# Patient Record
Sex: Male | Born: 1965
Health system: Southern US, Community
[De-identification: ages and names within clinical notes are randomized; demographics above are authoritative.]

## PROBLEM LIST (undated history)

## (undated) DIAGNOSIS — G4730 Sleep apnea, unspecified: Secondary | ICD-10-CM

## (undated) DIAGNOSIS — R0602 Shortness of breath: Secondary | ICD-10-CM

## (undated) DIAGNOSIS — E785 Hyperlipidemia, unspecified: Secondary | ICD-10-CM

## (undated) DIAGNOSIS — K429 Umbilical hernia without obstruction or gangrene: Secondary | ICD-10-CM

## (undated) DIAGNOSIS — M2020 Hallux rigidus, unspecified foot: Secondary | ICD-10-CM

## (undated) DIAGNOSIS — I10 Essential (primary) hypertension: Secondary | ICD-10-CM

## (undated) HISTORY — DX: Umbilical hernia without obstruction or gangrene: K42.9

## (undated) HISTORY — DX: Essential (primary) hypertension: I10

## (undated) HISTORY — DX: Sleep apnea, unspecified: G47.30

## (undated) HISTORY — DX: Shortness of breath: R06.02

## (undated) HISTORY — PX: ROTATOR CUFF REPAIR: SHX139

## (undated) HISTORY — DX: Hyperlipidemia, unspecified: E78.5

---
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
## 2004-06-14 ENCOUNTER — Ambulatory Visit: Payer: Self-pay | Admitting: Family Medicine

## 2004-12-03 ENCOUNTER — Ambulatory Visit: Payer: Self-pay | Admitting: Family Medicine

## 2005-08-26 ENCOUNTER — Ambulatory Visit: Payer: Self-pay | Admitting: Family Medicine

## 2005-09-22 ENCOUNTER — Ambulatory Visit: Payer: Self-pay | Admitting: Family Medicine

## 2005-10-14 ENCOUNTER — Ambulatory Visit: Payer: Self-pay | Admitting: Family Medicine

## 2005-11-24 ENCOUNTER — Ambulatory Visit: Payer: Self-pay | Admitting: Family Medicine

## 2006-01-27 ENCOUNTER — Ambulatory Visit: Payer: Self-pay | Admitting: Family Medicine

## 2006-04-21 HISTORY — PX: TONSILLECTOMY AND ADENOIDECTOMY: SUR1326

## 2006-04-21 HISTORY — PX: UVULOPALATOPHARYNGOPLASTY: SHX827

## 2006-08-14 ENCOUNTER — Ambulatory Visit: Payer: Self-pay | Admitting: Physician Assistant

## 2010-11-08 ENCOUNTER — Ambulatory Visit (INDEPENDENT_AMBULATORY_CARE_PROVIDER_SITE_OTHER): Payer: Self-pay | Admitting: Surgery

## 2010-11-28 ENCOUNTER — Encounter (INDEPENDENT_AMBULATORY_CARE_PROVIDER_SITE_OTHER): Payer: Self-pay | Admitting: Surgery

## 2010-11-28 ENCOUNTER — Ambulatory Visit (INDEPENDENT_AMBULATORY_CARE_PROVIDER_SITE_OTHER): Payer: BC Managed Care – PPO | Admitting: Surgery

## 2010-11-28 VITALS — BP 132/96 | HR 72 | Temp 97.4°F | Ht 72.0 in | Wt 229.0 lb

## 2010-11-28 DIAGNOSIS — K429 Umbilical hernia without obstruction or gangrene: Secondary | ICD-10-CM

## 2010-11-28 NOTE — Progress Notes (Signed)
Scott Rasmussen is a 45 y.o. male.    Chief Complaint  Patient presents with  . Other    new pt- eval umb hernia    HPI HPIThe patient presents today at the request of Dr. weeks for a bulge at his bellybutton. It has been present for 2 months. It is not getting larger. It does not cause him any pain. He denies any nausea, vomiting, or change in bowel or bladder function. There is no redness or drainage from his umbilicus.   Past Medical History  Diagnosis Date  . Shortness of breath   . Gout   . Umbilical hernia   . Sleep apnea     Past Surgical History  Procedure Date  . Tonsillectomy and adenoidectomy   . Uvulopalatopharyngoplasty     Family History  Problem Relation Age of Onset  . Heart disease Father     heart attack    Social History History  Substance Use Topics  . Smoking status: Former Games developer  . Smokeless tobacco: Not on file  . Alcohol Use: No    No Known Allergies  Current Outpatient Prescriptions  Medication Sig Dispense Refill  . allopurinol (ZYLOPRIM) 300 MG tablet Daily.      Marland Kitchen doxycycline (VIBRAMYCIN) 100 MG capsule Ad lib.      Marland Kitchen Fenofibrate (TRICOR PO) Take by mouth daily.        Marland Kitchen HYDROcodone-acetaminophen (LORTAB) 7.5-500 MG per tablet Ad lib.      Marland Kitchen omega-3 acid ethyl esters (LOVAZA) 1 G capsule Take 2 g by mouth 2 (two) times daily.        Marland Kitchen omeprazole (PRILOSEC) 40 MG capsule Daily.      Marland Kitchen PROAIR HFA 108 (90 BASE) MCG/ACT inhaler Ad lib.        Review of Systems Review of Systems  Constitutional: Negative.   HENT: Negative.   Eyes: Negative.   Respiratory: Negative.   Cardiovascular: Negative.   Gastrointestinal: Negative.   Genitourinary: Negative.   Musculoskeletal: Negative.   Skin: Negative.   Neurological: Negative.   Endo/Heme/Allergies: Negative.   Psychiatric/Behavioral: Negative.     Physical Exam Physical Exam  Constitutional: He is oriented to person, place, and time. He appears well-developed and well-nourished.   HENT:  Head: Normocephalic and atraumatic.  Nose: Nose normal.  Eyes: Conjunctivae and EOM are normal. Pupils are equal, round, and reactive to light.  Neck: Normal range of motion. Neck supple.  Cardiovascular: Normal rate and regular rhythm.   Respiratory: Effort normal and breath sounds normal.  GI: Soft. Bowel sounds are normal.       Small umbilical hernia noted over superior aspect of umbilicus nontender. No drainage or redness noted. Reducible.  Musculoskeletal: Normal range of motion.  Neurological: He is alert and oriented to person, place, and time.  Skin: Skin is warm and dry.     Blood pressure 132/96, pulse 72, temperature 97.4 F (36.3 C), height 6' (1.829 m), weight 229 lb (103.874 kg).  Assessment/Plan Umbilical hernia asymptomatic  I discussed options of surgical repair versus observation with the patient.  It Is not causing him any problem and he wishes to observe this. Surgical risks were discussed as well as risks of observational therapy.  He will call if he develops any pain or increased swelling.  Laria Grimmett A. 11/28/2010, 12:08 PM

## 2010-11-28 NOTE — Patient Instructions (Signed)
Follow up if you develop pain or increased swelling.

## 2011-02-04 ENCOUNTER — Ambulatory Visit: Payer: Self-pay | Admitting: Physical Therapy

## 2012-07-14 ENCOUNTER — Telehealth: Payer: Self-pay | Admitting: Physician Assistant

## 2012-07-14 ENCOUNTER — Other Ambulatory Visit: Payer: Self-pay | Admitting: Physician Assistant

## 2012-07-14 DIAGNOSIS — E785 Hyperlipidemia, unspecified: Secondary | ICD-10-CM

## 2012-07-14 MED ORDER — FENOFIBRATE 145 MG PO TABS: 145.0000 mg | ORAL_TABLET | Freq: Every day | ORAL | Status: DC

## 2012-07-14 NOTE — Telephone Encounter (Signed)
PT TOLD THAT HE COULD POSSIBLY HAVE A DVT- AND WE HIGHLY RECCOMMENDED HIM TO GO BY URGENT CARE- WHILE IN [place] TODAY, DUE TO NO AVAIL. APPTS HERE TODAY.   PT ALSO NEEDS A WRITEN OUT MAIL ORDER RX FOR TRICOR- CALL PT WILL PICK UP.

## 2012-07-14 NOTE — Telephone Encounter (Signed)
Not sure if first message sent- he needs a mail order script for tricor- call when ready

## 2012-07-14 NOTE — Telephone Encounter (Signed)
tricor script approved

## 2012-07-14 NOTE — Telephone Encounter (Signed)
APPT MADE

## 2012-07-14 NOTE — Telephone Encounter (Signed)
ntbs for tingling in bottom of left foot and going up his calf. wtbs tomorrow of Friday

## 2012-08-18 ENCOUNTER — Telehealth: Payer: Self-pay | Admitting: Family Medicine

## 2012-08-19 NOTE — Telephone Encounter (Signed)
SPOKE WITH PATIENT AND WILL HAVE DR Christell Constant REVIEW RX

## 2012-11-15 ENCOUNTER — Encounter: Payer: Self-pay | Admitting: Family Medicine

## 2012-11-15 ENCOUNTER — Ambulatory Visit (INDEPENDENT_AMBULATORY_CARE_PROVIDER_SITE_OTHER): Payer: BC Managed Care – PPO | Admitting: Family Medicine

## 2012-11-15 VITALS — BP 127/88 | HR 101 | Temp 97.9°F | Ht 70.5 in | Wt 226.0 lb

## 2012-11-15 DIAGNOSIS — R5381 Other malaise: Secondary | ICD-10-CM

## 2012-11-15 DIAGNOSIS — Z0000 Encounter for general adult medical examination without abnormal findings: Secondary | ICD-10-CM

## 2012-11-15 DIAGNOSIS — K219 Gastro-esophageal reflux disease without esophagitis: Secondary | ICD-10-CM

## 2012-11-15 DIAGNOSIS — E785 Hyperlipidemia, unspecified: Secondary | ICD-10-CM

## 2012-11-15 DIAGNOSIS — M109 Gout, unspecified: Secondary | ICD-10-CM

## 2012-11-15 DIAGNOSIS — Z23 Encounter for immunization: Secondary | ICD-10-CM

## 2012-11-15 DIAGNOSIS — R5383 Other fatigue: Secondary | ICD-10-CM

## 2012-11-15 DIAGNOSIS — G4700 Insomnia, unspecified: Secondary | ICD-10-CM

## 2012-11-15 MED ORDER — FENOFIBRATE 145 MG PO TABS: 145.0000 mg | ORAL_TABLET | Freq: Every day | ORAL | Status: DC

## 2012-11-15 MED ORDER — OMEGA-3-ACID ETHYL ESTERS 1 G PO CAPS: 2.0000 g | ORAL_CAPSULE | Freq: Two times a day (BID) | ORAL | Status: DC

## 2012-11-15 MED ORDER — OMEPRAZOLE 40 MG PO CPDR: 40.0000 mg | DELAYED_RELEASE_CAPSULE | Freq: Every day | ORAL | Status: DC

## 2012-11-15 MED ORDER — INDOMETHACIN ER 75 MG PO CPCR: ORAL_CAPSULE | ORAL | Status: DC

## 2012-11-15 MED ORDER — ALLOPURINOL 300 MG PO TABS: 300.0000 mg | ORAL_TABLET | Freq: Every day | ORAL | Status: DC

## 2012-11-15 MED ORDER — ESZOPICLONE 2 MG PO TABS: ORAL_TABLET | ORAL | Status: DC

## 2012-11-15 NOTE — Progress Notes (Signed)
  Subjective:    Patient ID: Scott Rasmussen, male    DOB: January 16, 1966, 47 y.o.   MRN: [id number]  HPI This 47 y.o. male presents for evaluation of follow up on his gout.  He is doing fine And denies any gout problems.  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  He has hx of hyperlipidemia, GERD, and he has been having Fatigue lately.  He has hx of OSAS and has had uvuloplasty which has resolved this.   Review of Systems No chest pain, SOB, HA, dizziness, vision change, N/V, diarrhea, constipation, dysuria, urinary urgency or frequency, myalgias, arthralgias or rash.     Objective:   Physical Exam Vital signs noted  Well developed well nourished male.  HEENT - Head atraumatic Normocephalic                Eyes - PERRLA, Conjuctiva - clear Sclera- Clear EOMI                Ears - EAC's Wnl TM's Wnl Gross Hearing WNL                Nose - Nares patent                 Throat - oropharanx wnl Respiratory - Lungs CTA bilateral Cardiac - RRR S1 and S2 without murmur GI - Abdomen soft Nontender and bowel sounds active x 4 Extremities - No edema. Neuro - Grossly intact.       Assessment & Plan:  Gout - Plan: Uric acid, allopurinol (ZYLOPRIM) 300 MG tablet  Fatigue - Plan: Testosterone,Free and Total, POCT CBC  Need for Tdap vaccination - Plan: Tdap vaccine greater than or equal to 7yo IM  Healthcare maintenance - Plan: BMP8+EGFR, Lipid panel, Hepatic function panel  Other and unspecified hyperlipidemia - Plan: fenofibrate (TRICOR) 145 MG tablet, omega-3 acid ethyl esters (LOVAZA) 1 G capsule  GERD (gastroesophageal reflux disease) - Plan: omeprazole (PRILOSEC) 40 MG capsule  Insomnia - Plan: eszopiclone (LUNESTA) 2 MG TABS

## 2012-11-15 NOTE — Patient Instructions (Signed)

## 2012-11-16 ENCOUNTER — Other Ambulatory Visit (INDEPENDENT_AMBULATORY_CARE_PROVIDER_SITE_OTHER): Payer: BC Managed Care – PPO | Admitting: Family Medicine

## 2012-11-16 ENCOUNTER — Other Ambulatory Visit: Payer: BC Managed Care – PPO

## 2012-11-16 DIAGNOSIS — R5381 Other malaise: Secondary | ICD-10-CM

## 2012-11-16 DIAGNOSIS — M109 Gout, unspecified: Secondary | ICD-10-CM

## 2012-11-16 DIAGNOSIS — R5383 Other fatigue: Secondary | ICD-10-CM

## 2012-11-16 DIAGNOSIS — Z0000 Encounter for general adult medical examination without abnormal findings: Secondary | ICD-10-CM

## 2012-11-16 DIAGNOSIS — Z23 Encounter for immunization: Secondary | ICD-10-CM

## 2012-11-16 LAB — POCT CBC
Granulocyte percent: 64.5 %G (ref 37–80)
HCT, POC: 49.8 % (ref 43.5–53.7)
Hemoglobin: 16.7 g/dL (ref 14.1–18.1)
Lymph, poc: 1.6 (ref 0.6–3.4)
MCH, POC: 29.2 pg (ref 27–31.2)
MCHC: 33.6 g/dL (ref 31.8–35.4)
MCV: 86.9 fL (ref 80–97)
MPV: 8.2 fL (ref 0–99.8)
POC Granulocyte: 3.5 (ref 2–6.9)
POC LYMPH PERCENT: 28.2 %L (ref 10–50)
Platelet Count, POC: 285 10*3/uL (ref 142–424)
RBC: 5.7 M/uL (ref 4.69–6.13)
RDW, POC: 12.7 %
WBC: 5.5 10*3/uL (ref 4.6–10.2)

## 2012-11-16 NOTE — Progress Notes (Signed)
Patient here today for labs ordered by [facility].Scott Rasmussen

## 2012-11-18 ENCOUNTER — Ambulatory Visit (INDEPENDENT_AMBULATORY_CARE_PROVIDER_SITE_OTHER): Payer: BC Managed Care – PPO | Admitting: Family Medicine

## 2012-11-18 ENCOUNTER — Encounter: Payer: Self-pay | Admitting: Family Medicine

## 2012-11-18 VITALS — BP 130/88 | HR 79 | Temp 96.7°F | Wt 228.4 lb

## 2012-11-18 DIAGNOSIS — Q828 Other specified congenital malformations of skin: Secondary | ICD-10-CM

## 2012-11-18 DIAGNOSIS — R197 Diarrhea, unspecified: Secondary | ICD-10-CM

## 2012-11-18 LAB — BMP8+EGFR
BUN/Creatinine Ratio: 12 (ref 9–20)
BUN: 13 mg/dL (ref 6–24)
CO2: 23 mmol/L (ref 18–29)
Calcium: 9.7 mg/dL (ref 8.7–10.2)
Chloride: 104 mmol/L (ref 97–108)
Creatinine, Ser: 1.05 mg/dL (ref 0.76–1.27)
GFR calc Af Amer: 99 mL/min/{1.73_m2} (ref >59)
GFR calc non Af Amer: 85 mL/min/{1.73_m2} (ref >59)
Glucose: 88 mg/dL (ref 65–99)
Potassium: 4.6 mmol/L (ref 3.5–5.2)
Sodium: 142 mmol/L (ref 134–144)

## 2012-11-18 LAB — HEPATIC FUNCTION PANEL
ALT: 41 IU/L (ref 0–44)
AST: 27 IU/L (ref 0–40)
Albumin: 4.5 g/dL (ref 3.5–5.5)
Alkaline Phosphatase: 58 IU/L (ref 39–117)
Bilirubin, Direct: 0.1 mg/dL (ref 0.00–0.40)
Total Bilirubin: 0.4 mg/dL (ref 0.0–1.2)
Total Protein: 6.8 g/dL (ref 6.0–8.5)

## 2012-11-18 LAB — LIPID PANEL
Chol/HDL Ratio: 4.3 ratio units (ref 0.0–5.0)
Cholesterol, Total: 142 mg/dL (ref 100–199)
HDL: 33 mg/dL — ABNORMAL LOW (ref >39)
LDL Calculated: 83 mg/dL (ref 0–99)
Triglycerides: 130 mg/dL (ref 0–149)
VLDL Cholesterol Cal: 26 mg/dL (ref 5–40)

## 2012-11-18 LAB — URIC ACID: Uric Acid: 7.5 mg/dL (ref 3.7–8.6)

## 2012-11-18 LAB — TESTOSTERONE,FREE AND TOTAL
Testosterone, Free: 6.8 pg/mL (ref 6.8–21.5)
Testosterone: 293 ng/dL — ABNORMAL LOW (ref 348–1197)

## 2012-11-18 NOTE — Patient Instructions (Signed)
Diarrhea Diarrhea is frequent loose and watery bowel movements. It can cause you to feel weak and dehydrated. Dehydration can cause you to become tired and thirsty, have a dry mouth, and have decreased urination that often is dark yellow. Diarrhea is a sign of another problem, most often an infection that will not last long. In most cases, diarrhea typically lasts 2 3 days. However, it can last longer if it is a sign of something more serious. It is important to treat your diarrhea as directed by your caregive to lessen or prevent future episodes of diarrhea. CAUSES  Some common causes include:  Gastrointestinal infections caused by viruses, bacteria, or parasites.  Food poisoning or food allergies.  Certain medicines, such as antibiotics, chemotherapy, and laxatives.  Artificial sweeteners and fructose.  Digestive disorders. HOME CARE INSTRUCTIONS  Ensure adequate fluid intake (hydration): have 1 cup (8 oz) of fluid for each diarrhea episode. Avoid fluids that contain simple sugars or sports drinks, fruit juices, whole milk products, and sodas. Your urine should be clear or pale yellow if you are drinking enough fluids. Hydrate with an oral rehydration solution that you can purchase at pharmacies, retail stores, and online. You can prepare an oral rehydration solution at home by mixing the following ingredients together:    tsp table salt.   tsp baking soda.   tsp salt substitute containing potassium chloride.  1  tablespoons sugar.  1 L (34 oz) of water.  Certain foods and beverages may increase the speed at which food moves through the gastrointestinal (GI) tract. These foods and beverages should be avoided and include:  Caffeinated and alcoholic beverages.  High-fiber foods, such as raw fruits and vegetables, nuts, seeds, and whole grain breads and cereals.  Foods and beverages sweetened with sugar alcohols, such as xylitol, sorbitol, and mannitol.  Some foods may be well  tolerated and may help thicken stool including:  Starchy foods, such as rice, toast, pasta, low-sugar cereal, oatmeal, grits, baked potatoes, crackers, and bagels.  Bananas.  Applesauce.  Add probiotic-rich foods to help increase healthy bacteria in the GI tract, such as yogurt and fermented milk products.  Wash your hands well after each diarrhea episode.  Only take over-the-counter or prescription medicines as directed by your caregiver.  Take a warm bath to relieve any burning or pain from frequent diarrhea episodes. SEEK IMMEDIATE MEDICAL CARE IF:   You are unable to keep fluids down.  You have persistent vomiting.  You have blood in your stool, or your stools are black and tarry.  You do not urinate in 6 8 hours, or there is only a small amount of very dark urine.  You have abdominal pain that increases or localizes.  You have weakness, dizziness, confusion, or lightheadedness.  You have a severe headache.  Your diarrhea gets worse or does not get better.  You have a fever or persistent symptoms for more than 2 3 days.  You have a fever and your symptoms suddenly get worse. MAKE SURE YOU:   Understand these instructions.  Will watch your condition.  Will get help right away if you are not doing well or get worse. Document Released: 03/28/2002 Document Revised: 03/24/2012 Document Reviewed: 12/14/2011 ExitCare Patient Information 2014 [employer].  

## 2012-11-18 NOTE — Progress Notes (Signed)
  Subjective:    Patient ID: Scott Rasmussen, male    DOB: 08/24/1965, 47 y.o.   MRN: [id number]  HPI This 47 y.o. male presents for evaluation of skin tags around neck line which Are causing him pain.  He also is still having some diarrhea.   Review of Systems No chest pain, SOB, HA, dizziness, vision change, N/V, diarrhea, constipation, dysuria, urinary urgency or frequency, myalgias, arthralgias or rash.     Objective:   Physical Exam Vital signs noted  Well developed well nourished male.  Skin - skin tag on anterior neck, right neck, left neck, and right axilla with erythema swelling and discomfort. Skin tags are cleaned with alcohol and thin anesthetized with lidocaine with epi and then using bifurcator The skin tags are removed and triple abx cream applied.  Adequate hemastasis is acquired and patient tolerates well.       Assessment & Plan:  Accessory skin tags 4 skin tags removed which were irritated and causing discomfort to patient. Discussed continue to apply triple abx cream.  Diarrhea - Disucssed using imodium otc and if not better then get stool cx and tx with cipro for a few days.

## 2012-11-19 ENCOUNTER — Telehealth: Payer: Self-pay | Admitting: Family Medicine

## 2012-11-19 DIAGNOSIS — K219 Gastro-esophageal reflux disease without esophagitis: Secondary | ICD-10-CM

## 2012-11-19 DIAGNOSIS — M109 Gout, unspecified: Secondary | ICD-10-CM

## 2012-11-19 DIAGNOSIS — E785 Hyperlipidemia, unspecified: Secondary | ICD-10-CM

## 2012-11-19 MED ORDER — OMEGA-3-ACID ETHYL ESTERS 1 G PO CAPS: 2.0000 g | ORAL_CAPSULE | Freq: Two times a day (BID) | ORAL | Status: DC

## 2012-11-19 MED ORDER — FENOFIBRATE 145 MG PO TABS: 145.0000 mg | ORAL_TABLET | Freq: Every day | ORAL | Status: DC

## 2012-11-19 MED ORDER — ALLOPURINOL 300 MG PO TABS: 300.0000 mg | ORAL_TABLET | Freq: Every day | ORAL | Status: DC

## 2012-11-19 MED ORDER — OMEPRAZOLE 40 MG PO CPDR: 40.0000 mg | DELAYED_RELEASE_CAPSULE | Freq: Every day | ORAL | Status: DC

## 2012-11-19 NOTE — Telephone Encounter (Signed)
90 day supply approved by Ander Slade NP per Azalee Course CMA and medications ordered for  90 days and [facility]

## 2012-11-25 ENCOUNTER — Telehealth: Payer: Self-pay | Admitting: Family Medicine

## 2012-11-25 ENCOUNTER — Ambulatory Visit (INDEPENDENT_AMBULATORY_CARE_PROVIDER_SITE_OTHER): Payer: BC Managed Care – PPO

## 2012-11-25 ENCOUNTER — Other Ambulatory Visit: Payer: Self-pay | Admitting: *Deleted

## 2012-11-25 DIAGNOSIS — E291 Testicular hypofunction: Secondary | ICD-10-CM

## 2012-11-25 MED ORDER — TESTOSTERONE CYPIONATE 200 MG/ML IM SOLN
200.0000 mg | INTRAMUSCULAR | Status: AC
  Administered 2012-11-25: 200 mg via INTRAMUSCULAR

## 2012-11-25 MED ORDER — TESTOSTERONE CYPIONATE 200 MG/ML IM SOLN: 200.0000 mg | INTRAMUSCULAR | Status: DC

## 2012-11-25 NOTE — Telephone Encounter (Signed)
Gave patient his lab results.  He had not been contacted. Testosterone levels are low and he is coming in for injection today.

## 2012-11-26 ENCOUNTER — Telehealth: Payer: Self-pay | Admitting: Family Medicine

## 2012-11-26 ENCOUNTER — Encounter: Payer: Self-pay | Admitting: Nurse Practitioner

## 2012-11-26 ENCOUNTER — Ambulatory Visit (INDEPENDENT_AMBULATORY_CARE_PROVIDER_SITE_OTHER): Payer: BC Managed Care – PPO | Admitting: Nurse Practitioner

## 2012-11-26 VITALS — BP 138/84 | HR 94 | Temp 97.4°F | Ht 71.0 in | Wt 237.0 lb

## 2012-11-26 DIAGNOSIS — K219 Gastro-esophageal reflux disease without esophagitis: Secondary | ICD-10-CM | POA: Insufficient documentation

## 2012-11-26 DIAGNOSIS — M109 Gout, unspecified: Secondary | ICD-10-CM | POA: Insufficient documentation

## 2012-11-26 DIAGNOSIS — B372 Candidiasis of skin and nail: Secondary | ICD-10-CM

## 2012-11-26 DIAGNOSIS — E781 Pure hyperglyceridemia: Secondary | ICD-10-CM | POA: Insufficient documentation

## 2012-11-26 DIAGNOSIS — M545 Low back pain: Secondary | ICD-10-CM

## 2012-11-26 MED ORDER — NYSTATIN 100000 UNIT/GM EX CREA: TOPICAL_CREAM | Freq: Two times a day (BID) | CUTANEOUS | Status: DC

## 2012-11-26 MED ORDER — OXYCODONE-ACETAMINOPHEN 5-325 MG PO TABS: 1.0000 | ORAL_TABLET | Freq: Three times a day (TID) | ORAL | Status: DC | PRN

## 2012-11-26 NOTE — Addendum Note (Signed)
Addended by: Bennie Pierini on: 11/26/2012 03:34 PM   Modules accepted: Orders

## 2012-11-26 NOTE — Progress Notes (Signed)
  Subjective:    Patient ID: BHAVYA GRAND, male    DOB: 06-Jun-1965, 47 y.o.   MRN: [id number]  HPI Patient here with rash in left axillary area- noticed it about 5-7 days ago- burning and itching  * intermittent low back pain- use to be on lortab as needed an d30 would last him over 6 months- hasn't been helping - got percocet last time and would like a FEW to keep on hand.   Review of Systems  All other systems reviewed and are negative.       Objective:   Physical Exam  Constitutional: He appears well-developed and well-nourished.  Cardiovascular: Normal rate, regular rhythm and normal heart sounds.   Pulmonary/Chest: Effort normal and breath sounds normal.  Musculoskeletal:  FROM of lumbar spin wihout pain (-) SLR bil Motor strength and sensation distally intact  Neurological: He has normal reflexes.  Skin:  Erythematous papular rash left axillia  Psychiatric: He has a normal mood and affect. His behavior is normal. Judgment and thought content normal.    There were no vitals taken for this visit.       Assessment & Plan:  1. Cutaneous candidiasis Keep area clean and dry No antiperspirant or deodorant until heals - nystatin cream (MYCOSTATIN); Apply topically 2 (two) times daily.  Dispense: 30 g; Refill: 2  2. Intermittent low back pain -percocet 1 po prn #20 ) refills Moist heat  Rest rto PRn  Mary-Margaret Daphine Deutscher, FNP

## 2012-11-26 NOTE — Telephone Encounter (Signed)
Appt given for today 

## 2012-11-26 NOTE — Patient Instructions (Signed)
Yeast Infection of the Skin Some yeast on the skin is normal, but sometimes it causes an infection. If you have a yeast infection, it shows up as white or light brown patches on brown skin. You can see it better in the summer on tan skin. It causes light-colored holes in your suntan. It can happen on any area of the body. This cannot be passed from person to person. HOME CARE  Scrub your skin daily with a dandruff shampoo. Your rash may take a couple weeks to get well.  Do not scratch or itch the rash. GET HELP RIGHT AWAY IF:   You get another infection from scratching. The skin may get warm, red, and may ooze fluid.  The infection does not seem to be getting better. MAKE SURE YOU:  Understand these instructions.  Will watch your condition.  Will get help right away if you are not doing well or get worse. Document Released: 03/20/2008 Document Revised: 06/30/2011 Document Reviewed: 03/20/2008 ExitCare Patient Information 2014 [employer].  

## 2013-01-03 ENCOUNTER — Encounter: Payer: Self-pay | Admitting: Pharmacist

## 2013-01-03 ENCOUNTER — Ambulatory Visit (INDEPENDENT_AMBULATORY_CARE_PROVIDER_SITE_OTHER): Payer: BC Managed Care – PPO | Admitting: Pharmacist

## 2013-01-03 VITALS — BP 138/78 | HR 77 | Ht 71.0 in | Wt 233.0 lb

## 2013-01-03 DIAGNOSIS — E66812 Obesity, class 2: Secondary | ICD-10-CM | POA: Insufficient documentation

## 2013-01-03 DIAGNOSIS — R635 Abnormal weight gain: Secondary | ICD-10-CM

## 2013-01-03 DIAGNOSIS — E669 Obesity, unspecified: Secondary | ICD-10-CM

## 2013-01-03 MED ORDER — LORCASERIN HCL 10 MG PO TABS: 1.0000 | ORAL_TABLET | Freq: Two times a day (BID) | ORAL | Status: DC

## 2013-01-03 NOTE — Progress Notes (Signed)
   Subjective:     Scott Rasmussen is a 47 y.o. male here for discussion regarding weight loss. He has noted a weight gain of approximately 10 pounds over the last 1 year. He feels ideal weight is 210 pounds. In March of 2013 patient started phentermine 37.5mg  through our office and successfully lost about 20 pounds.  Though he has gain some of it back.  He feels that the phentermine was effective but would like to try on of the newer medications for weight loss.   History of eating disorders: none. Previous treatments for obesity include nutritionist consultation, prescription appetite suppressants: phentermine and supervised diet program. Obesity associated medical conditions: hyperlipidemia. Obesity associated medications: none. Cardiovascular risk factors besides obesity: dyslipidemia, male gender and obesity (BMI >= 30 kg/m2).  The following portions of the patient's history were reviewed and updated as appropriate: allergies, current medications, past family history, past medical history, past social history, past surgical history and problem list.    Objective:    Body mass index is 32.51 kg/(m^2). Filed Weights   01/03/13 1616  Weight: 233 lb (105.688 kg)   Filed Vitals:   01/03/13 1616  BP: 138/78  Pulse: 77     Assessment:    Obesity. I assessed Scott Rasmussen to be in an action stage with respect to weight loss.    Plan:    General weight loss/lifestyle modification strategies discussed (elicit support from others; identify saboteurs; non-food rewards, etc). Diet interventions: moderate (500 kCal/d) deficit diet. Regular aerobic exercise program discussed. Medication: belviq 10mg  1 tablet twice a day - rx for #30 and coupon for 15 days free given.   RTC in 6 weeks.  Patient is to call if medication if effective for rx.   Henrene Pastor, PharmD, CPP

## 2013-01-19 ENCOUNTER — Ambulatory Visit (INDEPENDENT_AMBULATORY_CARE_PROVIDER_SITE_OTHER): Payer: BC Managed Care – PPO | Admitting: Family Medicine

## 2013-01-19 ENCOUNTER — Encounter: Payer: Self-pay | Admitting: Family Medicine

## 2013-01-19 VITALS — BP 121/82 | HR 88 | Temp 98.6°F | Ht 71.0 in | Wt 230.0 lb

## 2013-01-19 DIAGNOSIS — J209 Acute bronchitis, unspecified: Secondary | ICD-10-CM

## 2013-01-19 DIAGNOSIS — B372 Candidiasis of skin and nail: Secondary | ICD-10-CM

## 2013-01-19 MED ORDER — NYSTATIN 100000 UNIT/GM EX CREA: TOPICAL_CREAM | Freq: Two times a day (BID) | CUTANEOUS | Status: DC

## 2013-01-19 MED ORDER — HYDROCODONE-HOMATROPINE 5-1.5 MG/5ML PO SYRP: 5.0000 mL | ORAL_SOLUTION | Freq: Three times a day (TID) | ORAL | Status: DC | PRN

## 2013-01-19 MED ORDER — METHYLPREDNISOLONE (PAK) 4 MG PO TABS: ORAL_TABLET | ORAL | Status: DC

## 2013-01-19 MED ORDER — AMOXICILLIN 875 MG PO TABS: 875.0000 mg | ORAL_TABLET | Freq: Two times a day (BID) | ORAL | Status: DC

## 2013-01-19 NOTE — Progress Notes (Signed)
  Subjective:    Patient ID: JOSHUWA VECCHIO, male    DOB: 03-Jan-1966, 47 y.o.   MRN: [id number]  HPI This 47 y.o. male presents for evaluation of cough and congestion and uri sx's for over a week. He is having persistent cough at night and cannot sleep.   Review of Systems C/o cough and congestion and uri sx's. No chest pain, SOB, HA, dizziness, vision change, N/V, diarrhea, constipation, dysuria, urinary urgency or frequency, myalgias, arthralgias or rash.     Objective:   Physical Exam Vital signs noted  Well developed well nourished male.  HEENT - Head atraumatic Normocephalic                Eyes - PERRLA, Conjuctiva - clear Sclera- Clear EOMI                Ears - EAC's Wnl TM's Wnl Gross Hearing WNL                Nose - Nares patent                 Throat - oropharanx wnl Respiratory - Lungs CTA bilateral Cardiac - RRR S1 and S2 without murmur GI - Abdomen soft Nontender and bowel sounds active x 4 Extremities - No edema. Neuro - Grossly intact.       Assessment & Plan:  Cutaneous candidiasis - Plan: nystatin cream (MYCOSTATIN)  Acute bronchitis - Plan: amoxicillin (AMOXIL) 875 MG tablet, HYDROcodone-homatropine (HYCODAN) 5-1.5 MG/5ML syrup, methylPREDNIsolone (MEDROL DOSPACK) 4 MG tablet  Deatra Canter FNP

## 2013-01-19 NOTE — Patient Instructions (Signed)

## 2013-01-20 ENCOUNTER — Telehealth: Payer: Self-pay | Admitting: Family Medicine

## 2013-01-20 DIAGNOSIS — R05 Cough: Secondary | ICD-10-CM

## 2013-01-20 MED ORDER — HYDROCOD POLST-CHLORPHEN POLST 10-8 MG/5ML PO LQCR: 5.0000 mL | Freq: Two times a day (BID) | ORAL | Status: DC | PRN

## 2013-01-20 NOTE — Telephone Encounter (Signed)
[facility] 

## 2013-01-25 ENCOUNTER — Telehealth: Payer: Self-pay | Admitting: Family Medicine

## 2013-01-25 NOTE — Telephone Encounter (Signed)
Can you please get Bill to look at this?  Is there anything else he can get?

## 2013-01-26 NOTE — Telephone Encounter (Signed)
Patient is itching from Hycodan Was told to try Bendryl This did relieve his sxs Now needs refill on Hycodan Was instructed to use Mucinex during the day

## 2013-01-27 ENCOUNTER — Other Ambulatory Visit: Payer: Self-pay | Admitting: Family Medicine

## 2013-01-27 MED ORDER — BENZONATATE 100 MG PO CAPS: 100.0000 mg | ORAL_CAPSULE | Freq: Two times a day (BID) | ORAL | Status: DC | PRN

## 2013-01-27 NOTE — Telephone Encounter (Signed)
PLEASE ADDRESS

## 2013-02-02 ENCOUNTER — Telehealth: Payer: Self-pay | Admitting: Pharmacist

## 2013-02-02 ENCOUNTER — Other Ambulatory Visit: Payer: Self-pay | Admitting: Family Medicine

## 2013-02-02 MED ORDER — PHENTERMINE HCL 37.5 MG PO CAPS: 37.5000 mg | ORAL_CAPSULE | ORAL | Status: DC

## 2013-02-02 NOTE — Telephone Encounter (Signed)
Took belviq for 15 days but states that he did not have any effect.  Did notice any decrease in appetite.  Wants to retry phentermine 37.5mg  daily - lost 20# about a year ago.   Discussed with his PCP - Bill Oxford and since BP and HR have been normal will prescribe for 1 month.  RTC to be checked in 1 month

## 2013-02-08 ENCOUNTER — Ambulatory Visit: Payer: Self-pay

## 2013-02-21 ENCOUNTER — Ambulatory Visit (INDEPENDENT_AMBULATORY_CARE_PROVIDER_SITE_OTHER): Payer: BC Managed Care – PPO | Admitting: *Deleted

## 2013-02-21 DIAGNOSIS — Z23 Encounter for immunization: Secondary | ICD-10-CM

## 2013-07-28 ENCOUNTER — Telehealth: Payer: Self-pay | Admitting: Pharmacist

## 2013-07-28 NOTE — Telephone Encounter (Signed)
NTBS to get rx for phentermine.  appt made for 08/03/13 at 12 noon,

## 2013-08-03 ENCOUNTER — Ambulatory Visit (INDEPENDENT_AMBULATORY_CARE_PROVIDER_SITE_OTHER): Payer: BC Managed Care – PPO | Admitting: Pharmacist

## 2013-08-03 ENCOUNTER — Encounter: Payer: Self-pay | Admitting: Pharmacist

## 2013-08-03 DIAGNOSIS — E669 Obesity, unspecified: Secondary | ICD-10-CM

## 2013-08-03 DIAGNOSIS — R635 Abnormal weight gain: Secondary | ICD-10-CM

## 2013-08-03 MED ORDER — CETIRIZINE HCL 10 MG PO TABS: 10.0000 mg | ORAL_TABLET | Freq: Every day | ORAL | Status: DC

## 2013-08-03 MED ORDER — PHENTERMINE HCL 37.5 MG PO TABS: 37.5000 mg | ORAL_TABLET | Freq: Every day | ORAL | Status: DC

## 2013-08-03 NOTE — Progress Notes (Signed)
Patient ID: Priscille HeidelbergWilliam K Geister, male   DOB: 1966-04-01, 48 y.o.   MRN: [id number]   Subjective:     Priscille HeidelbergWilliam K Klausing is a 48 y.o. male here for discussion regarding weight loss. He tried [employer] Corporation[employer]leviq for weight loss and did not see adequate effects.  I last saw him in September when he was taking phentermine for about 2 months.  He lost about 10 lbs and has been able to maintain weight from October through March.    He feels ideal weight is 210 pounds.  History of eating disorders: none. Previous treatments for obesity include nutritionist consultation, prescription appetite suppressants: phentermine and supervised diet program. Obesity associated medical conditions: hyperlipidemia. Obesity associated medications: none. Cardiovascular risk factors besides obesity: dyslipidemia, male gender and obesity (BMI >= 30 kg/m2).  Current Diet - eating more protein and less bread.  The following portions of the patient's history were reviewed and updated as appropriate: allergies, current medications, past family history, past medical history, past social history, past surgical history and problem list.    Objective:    Body mass index is 32.13 kg/(m^2). Filed Weights   08/03/13 1238  Weight: 230 lb 4 oz (104.441 kg)   Starting Weight was 243# in 2014.  Body mass index is 32.13 kg/(m^2).  Filed Vitals:   08/03/13 1238  BP: 130/80  Pulse: 80     Assessment:    Obesity. I assessed Chrissie NoaWilliam to be in an action stage with respect to weight loss.    Plan:    General weight loss/lifestyle modification strategies discussed (elicit support from others; identify saboteurs; non-food rewards, etc). Diet interventions: moderate (500 kCal/d) deficit diet and discussed Paleo type diet that limits sugar and processed foods. Regular aerobic exercise program discussed. Medication: phentermine.   RTC in 6 weeks.    Henrene Pastorammy Laurence Folz, PharmD, CPP

## 2013-08-29 ENCOUNTER — Telehealth: Payer: Self-pay | Admitting: Pharmacist

## 2013-08-29 MED ORDER — PHENTERMINE HCL 37.5 MG PO TABS: 37.5000 mg | ORAL_TABLET | Freq: Every day | ORAL | Status: DC

## 2013-08-29 MED ORDER — LORATADINE 10 MG PO TABS
10.0000 mg | ORAL_TABLET | Freq: Every day | ORAL | Status: DC
Start: 2013-08-29 — End: 2015-07-04

## 2013-08-29 NOTE — Telephone Encounter (Signed)
[facility] (clar[facility]) Patient aware

## 2013-09-08 ENCOUNTER — Ambulatory Visit (INDEPENDENT_AMBULATORY_CARE_PROVIDER_SITE_OTHER): Payer: BC Managed Care – PPO | Admitting: Family Medicine

## 2013-09-08 ENCOUNTER — Encounter: Payer: Self-pay | Admitting: Family Medicine

## 2013-09-08 ENCOUNTER — Telehealth: Payer: Self-pay | Admitting: Family Medicine

## 2013-09-08 VITALS — BP 116/72 | HR 101 | Temp 97.3°F | Ht 71.0 in | Wt 221.8 lb

## 2013-09-08 DIAGNOSIS — E291 Testicular hypofunction: Secondary | ICD-10-CM

## 2013-09-08 DIAGNOSIS — E785 Hyperlipidemia, unspecified: Secondary | ICD-10-CM

## 2013-09-08 DIAGNOSIS — M109 Gout, unspecified: Secondary | ICD-10-CM

## 2013-09-08 MED ORDER — DOXYCYCLINE HYCLATE 100 MG PO TABS: 100.0000 mg | ORAL_TABLET | Freq: Two times a day (BID) | ORAL | Status: DC

## 2013-09-08 MED ORDER — HYDROCODONE-HOMATROPINE 5-1.5 MG/5ML PO SYRP: 5.0000 mL | ORAL_SOLUTION | Freq: Three times a day (TID) | ORAL | Status: DC | PRN

## 2013-09-08 NOTE — Progress Notes (Signed)
   Subjective:    Patient ID: Scott Rasmussen, male    DOB: 1966-04-20, 48 y.o.   MRN: [id number]  HPI  This 48 y.o. male presents for evaluation of uri sx's and needs labs for testosterone replacement. He states his wife is injecting him sq instead of IM with the testosterone.  He has hx of gout and is taking allopurinol.  Review of Systems C/o uri sx's   No chest pain, SOB, HA, dizziness, vision change, N/V, diarrhea, constipation, dysuria, urinary urgency or frequency, myalgias, arthralgias or rash.  Objective:   Physical Exam Vital signs noted  Well developed well nourished male.  HEENT - Head atraumatic Normocephalic                Eyes - PERRLA, Conjuctiva - clear Sclera- Clear EOMI                Ears - EAC's Wnl TM's Wnl Gross Hearing WNL                 Throat - oropharanx wnl Respiratory - Lungs CTA bilateral Cardiac - RRR S1 and S2 without murmur GI - Abdomen soft Nontender and bowel sounds active x 4 Extremities - No edema. Neuro - Grossly intact.       Assessment & Plan:  Hypogonadism male - Plan: POCT CBC, CMP14+EGFR, Testosterone,Free and Total, PSA, total and free  Gout - Plan: Uric acid  Other and unspecified hyperlipidemia - Plan: Lipid panel  URI - Doxycycline $RemoveBefor'100mg'[id number]$  po bid x 10 days and hycodan syrup one tsp po qid prn #158ml Push po fluids, rest, tylenol and motrin otc prn as directed for fever, arthralgias, and myalgias.  Follow up prn if sx's continue or persist.  Lysbeth Penner FNP

## 2013-09-15 ENCOUNTER — Other Ambulatory Visit (INDEPENDENT_AMBULATORY_CARE_PROVIDER_SITE_OTHER): Payer: BC Managed Care – PPO

## 2013-09-15 DIAGNOSIS — M109 Gout, unspecified: Secondary | ICD-10-CM

## 2013-09-15 DIAGNOSIS — E785 Hyperlipidemia, unspecified: Secondary | ICD-10-CM

## 2013-09-15 DIAGNOSIS — K219 Gastro-esophageal reflux disease without esophagitis: Secondary | ICD-10-CM

## 2013-09-15 DIAGNOSIS — E291 Testicular hypofunction: Secondary | ICD-10-CM

## 2013-09-15 LAB — POCT CBC
Granulocyte percent: 69.1 %G (ref 37–80)
HCT, POC: 55.2 % — AB (ref 43.5–53.7)
Hemoglobin: 17.7 g/dL (ref 14.1–18.1)
Lymph, poc: 1.6 (ref 0.6–3.4)
MCH, POC: 28.5 pg (ref 27–31.2)
MCHC: 32 g/dL (ref 31.8–35.4)
MCV: 88.8 fL (ref 80–97)
MPV: 9.3 fL (ref 0–99.8)
POC Granulocyte: 3.9 (ref 2–6.9)
POC LYMPH PERCENT: 27.7 %L (ref 10–50)
Platelet Count, POC: 281 10*3/uL (ref 142–424)
RBC: 6.2 M/uL — AB (ref 4.69–6.13)
RDW, POC: 13.3 %
WBC: 5.7 10*3/uL (ref 4.6–10.2)

## 2013-09-15 NOTE — Progress Notes (Signed)
Patient came in for labs only.

## 2013-09-16 LAB — CMP14+EGFR
ALT: 27 IU/L (ref 0–44)
AST: 28 IU/L (ref 0–40)
Albumin/Globulin Ratio: 2.3 (ref 1.1–2.5)
Albumin: 4.9 g/dL (ref 3.5–5.5)
Alkaline Phosphatase: 64 IU/L (ref 39–117)
BUN/Creatinine Ratio: 13 (ref 9–20)
BUN: 16 mg/dL (ref 6–24)
CO2: 23 mmol/L (ref 18–29)
Calcium: 10 mg/dL (ref 8.7–10.2)
Chloride: 102 mmol/L (ref 97–108)
Creatinine, Ser: 1.2 mg/dL (ref 0.76–1.27)
GFR calc Af Amer: 83 mL/min/{1.73_m2} (ref >59)
GFR calc non Af Amer: 72 mL/min/{1.73_m2} (ref >59)
Globulin, Total: 2.1 g/dL (ref 1.5–4.5)
Glucose: 84 mg/dL (ref 65–99)
Potassium: 4.3 mmol/L (ref 3.5–5.2)
Sodium: 140 mmol/L (ref 134–144)
Total Bilirubin: 0.6 mg/dL (ref 0.0–1.2)
Total Protein: 7 g/dL (ref 6.0–8.5)

## 2013-09-16 LAB — LIPID PANEL
Chol/HDL Ratio: 4.7 ratio units (ref 0.0–5.0)
Cholesterol, Total: 142 mg/dL (ref 100–199)
HDL: 30 mg/dL — ABNORMAL LOW (ref >39)
LDL Calculated: 88 mg/dL (ref 0–99)
Triglycerides: 119 mg/dL (ref 0–149)
VLDL Cholesterol Cal: 24 mg/dL (ref 5–40)

## 2013-09-16 LAB — PSA, TOTAL AND FREE
PSA, Free Pct: 26.7 %
PSA, Free: 0.08 ng/mL
PSA: 0.3 ng/mL (ref 0.0–4.0)

## 2013-09-16 LAB — TESTOSTERONE,FREE AND TOTAL
Testosterone, Free: 12.3 pg/mL (ref 6.8–21.5)
Testosterone: 255 ng/dL — ABNORMAL LOW (ref 348–1197)

## 2013-09-16 LAB — URIC ACID: Uric Acid: 5.5 mg/dL (ref 3.7–8.6)

## 2013-09-19 ENCOUNTER — Other Ambulatory Visit: Payer: Self-pay | Admitting: Family Medicine

## 2013-09-20 ENCOUNTER — Telehealth: Payer: Self-pay | Admitting: Family Medicine

## 2013-09-20 DIAGNOSIS — E291 Testicular hypofunction: Secondary | ICD-10-CM

## 2013-09-20 MED ORDER — FLUTICASONE PROPIONATE 50 MCG/ACT NA SUSP: 2.0000 | Freq: Every day | NASAL | Status: DC

## 2013-09-20 MED ORDER — TESTOSTERONE CYPIONATE 200 MG/ML IM SOLN: 200.0000 mg | INTRAMUSCULAR | Status: DC

## 2013-09-20 NOTE — Telephone Encounter (Signed)
Symptoms are improving. Continues to have a cough with some sputum production but this has improved. CP resolved.  No nasal congestion, or facial pain/pressure.  He is taking Mucinex and Doxycyline.  Encouraged to increase water intake. Continue Mucinex and Doxycycline.  He requested a refill on flonase but this wasn't addressed. [facility].  Also needed refill on testosterone. [facility]  Discussed lab results as well. Patient will call back if symptoms worsen or persist. He stated understanding and agreement to plan.

## 2013-09-21 ENCOUNTER — Ambulatory Visit (INDEPENDENT_AMBULATORY_CARE_PROVIDER_SITE_OTHER): Payer: BC Managed Care – PPO | Admitting: *Deleted

## 2013-09-21 DIAGNOSIS — R7989 Other specified abnormal findings of blood chemistry: Secondary | ICD-10-CM

## 2013-09-21 DIAGNOSIS — E291 Testicular hypofunction: Secondary | ICD-10-CM

## 2013-09-21 MED ORDER — TESTOSTERONE CYPIONATE 200 MG/ML IM SOLN: 200.0000 mg | INTRAMUSCULAR | Status: DC

## 2013-09-21 NOTE — Progress Notes (Signed)
Testosterone injection given and tolerated well.  

## 2013-09-21 NOTE — Patient Instructions (Signed)
Testosterone injection What is this medicine? TESTOSTERONE (tes TOS ter one) is the main male hormone. It supports normal male development such as muscle growth, facial hair, and deep voice. It is used in males to treat low testosterone levels. This medicine may be used for other purposes; ask your health care provider or pharmacist if you have questions. COMMON BRAND NAME(S): Andro-L.A., Aveed, Delatestryl, Depo-Testosterone, Virilon What should I tell my health care provider before I take this medicine? They need to know if you have any of these conditions: -breast cancer -diabetes -heart disease -kidney disease -liver disease -lung disease -prostate cancer, enlargement -an unusual or allergic reaction to testosterone, other medicines, foods, dyes, or preservatives -pregnant or trying to get pregnant -breast-feeding How should I use this medicine? This medicine is for injection into a muscle. It is usually given by a health care professional in a hospital or clinic setting. Contact your pediatrician regarding the use of this medicine in children. While this medicine may be prescribed for children as young as 12 years of age for selected conditions, precautions do apply. Overdosage: If you think you have taken too much of this medicine contact a poison control center or emergency room at once. NOTE: This medicine is only for you. Do not share this medicine with others. What if I miss a dose? Try not to miss a dose. Your doctor or health care professional will tell you when your next injection is due. Notify the office if you are unable to keep an appointment. What may interact with this medicine? -medicines for diabetes -medicines that treat or prevent blood clots like warfarin -oxyphenbutazone -propranolol -steroid medicines like prednisone or cortisone This list may not describe all possible interactions. Give your health care provider a list of all the medicines, herbs,  non-prescription drugs, or dietary supplements you use. Also tell them if you smoke, drink alcohol, or use illegal drugs. Some items may interact with your medicine. What should I watch for while using this medicine? Visit your doctor or health care professional for regular checks on your progress. They will need to check the level of testosterone in your blood. This medicine may affect blood sugar levels. If you have diabetes, check with your doctor or health care professional before you change your diet or the dose of your diabetic medicine. This drug is banned from use in athletes by most athletic organizations. What side effects may I notice from receiving this medicine? Side effects that you should report to your doctor or health care professional as soon as possible: -allergic reactions like skin rash, itching or hives, swelling of the face, lips, or tongue -breast enlargement -breathing problems -changes in mood, especially anger, depression, or rage -dark urine -general ill feeling or flu-like symptoms -light-colored stools -loss of appetite, nausea -nausea, vomiting -right upper belly pain -stomach pain -swelling of ankles -too frequent or persistent erections -trouble passing urine or change in the amount of urine -unusually weak or tired -yellowing of the eyes or skin Additional side effects that can occur in women include: -deep or hoarse voice -facial hair growth -irregular menstrual periods Side effects that usually do not require medical attention (report to your doctor or health care professional if they continue or are bothersome): -acne -change in sex drive or performance -hair loss -headache This list may not describe all possible side effects. Call your doctor for medical advice about side effects. You may report side effects to FDA at 1-800-FDA-1088. Where should I keep my medicine? Keep   out of the reach of children. This medicine can be abused. Keep your  medicine in a safe place to protect it from theft. Do not share this medicine with anyone. Selling or giving away this medicine is dangerous and against the law. Store at room temperature between 20 and 25 degrees C (68 and 77 degrees F). Do not freeze. Protect from light. Follow the directions for the product you are prescribed. Throw away any unused medicine after the expiration date. NOTE: This sheet is a summary. It may not cover all possible information. If you have questions about this medicine, talk to your doctor, pharmacist, or health care provider.  2014, Elsevier/Gold Standard. (2007-06-18 16:13:46)  

## 2013-10-07 ENCOUNTER — Other Ambulatory Visit (INDEPENDENT_AMBULATORY_CARE_PROVIDER_SITE_OTHER): Payer: BC Managed Care – PPO

## 2013-10-07 DIAGNOSIS — E291 Testicular hypofunction: Secondary | ICD-10-CM

## 2013-10-07 DIAGNOSIS — R7989 Other specified abnormal findings of blood chemistry: Secondary | ICD-10-CM

## 2013-10-08 LAB — TESTOSTERONE,FREE AND TOTAL
Testosterone, Free: 6.6 pg/mL — ABNORMAL LOW (ref 6.8–21.5)
Testosterone: 157 ng/dL — ABNORMAL LOW (ref 348–1197)

## 2013-10-11 ENCOUNTER — Other Ambulatory Visit: Payer: Self-pay | Admitting: Family Medicine

## 2013-10-26 ENCOUNTER — Encounter: Payer: Self-pay | Admitting: Family Medicine

## 2013-10-26 ENCOUNTER — Ambulatory Visit (INDEPENDENT_AMBULATORY_CARE_PROVIDER_SITE_OTHER): Payer: BC Managed Care – PPO | Admitting: Family Medicine

## 2013-10-26 VITALS — BP 126/85 | HR 105 | Temp 97.1°F | Ht 71.0 in | Wt 225.6 lb

## 2013-10-26 DIAGNOSIS — R0789 Other chest pain: Secondary | ICD-10-CM

## 2013-10-26 DIAGNOSIS — R197 Diarrhea, unspecified: Secondary | ICD-10-CM

## 2013-10-26 DIAGNOSIS — T148 Other injury of unspecified body region: Secondary | ICD-10-CM

## 2013-10-26 DIAGNOSIS — Z8249 Family history of ischemic heart disease and other diseases of the circulatory system: Secondary | ICD-10-CM

## 2013-10-26 DIAGNOSIS — W57XXXA Bitten or stung by nonvenomous insect and other nonvenomous arthropods, initial encounter: Secondary | ICD-10-CM

## 2013-10-26 NOTE — Addendum Note (Signed)
Addended by: Andretta Ergle F on: 10/26/2013 06:13 PM   Modules accepted: Orders  

## 2013-10-26 NOTE — Progress Notes (Signed)
Subjective:    Patient ID: Scott Rasmussen, male    DOB: May 13, 1965, 48 y.o.   MRN: [id number]  HPI Pt is here today for intermittent episodes of diarrhea x 2 weeks. He has recently been to [place] and he raw oysters. He is also having some chest pressure today. The patient also gives a history of a tick bite about 6 weeks ago. It is important to note several things for this patient. He had been off a course of doxycycline about 2 weeks before the diarrhea started. He is having anywhere from 3-6 loose bowel movements a day and they are more soft than they are watery. He has been taking up to 3 Imodium a day. He has not had any severe abdominal pain or weight loss.   Review of Systems  Constitutional: Positive for fatigue. Negative for fever.  Cardiovascular: Positive for chest pain (started today at lunch time, described as "tightness").  Gastrointestinal: Positive for nausea and diarrhea (intermitent x 2 weeks). Negative for abdominal pain.  Neurological: Negative for headaches.   It is important to note that the patient is a past smoker.    Objective:   Physical Exam  Nursing note and vitals reviewed. Constitutional: He is oriented to person, place, and time. He appears well-developed and well-nourished. No distress.  HENT:  Head: Normocephalic and atraumatic.  Right Ear: External ear normal.  Left Ear: External ear normal.  Nose: Nose normal.  Mouth/Throat: Oropharynx is clear and moist. No oropharyngeal exudate.  Eyes: Conjunctivae and EOM are normal. Pupils are equal, round, and reactive to light. Right eye exhibits no discharge. Left eye exhibits no discharge. No scleral icterus.  Neck: Normal range of motion. Neck supple. No thyromegaly present.  Cardiovascular: Normal rate, regular rhythm, normal heart sounds and intact distal pulses.  Exam reveals no gallop and no friction rub.   No murmur heard. At 72 per minute  Pulmonary/Chest: Effort normal and breath sounds normal. No  respiratory distress. He has no wheezes. He has no rales. He exhibits no tenderness.  Abdominal: Soft. Bowel sounds are normal. He exhibits no mass. There is tenderness (slight left lower quadrant tenderness). There is no rebound and no guarding.  Musculoskeletal: Normal range of motion. He exhibits no edema and no tenderness.  Lymphadenopathy:    He has no cervical adenopathy.  Neurological: He is alert and oriented to person, place, and time.  Skin: Skin is warm and dry. No rash noted. No erythema. No pallor.  Psychiatric: He has a normal mood and affect. His behavior is normal. Judgment and thought content normal.   BP 126/85  Pulse 105  Temp(Src) 97.1 F (36.2 C) (Oral)  Ht '5\' 11"'  (1.803 m)  Wt 225 lb 9.6 oz (102.331 kg)  BMI 31.48 kg/m2  EKG: normal EKG, normal sinus rhythm--short PR interview       Assessment & Plan:  1. Chest pressure - EKG 12-Lead  2. Tick bite - [place] [facility] spotted fvr abs pnl(IgG+IgM) - Lyme Ab/Western Blot Reflex  3. Diarrhea - POCT CBC - BMP8+EGFR - Hepatic function panel  4. Family history of heart disease in male family member before age 107  5. Testosterone deficiency  Patient Instructions  Clear liquids for 24 hours (like 7-Up, ginger ale, Sprite, Jello, frozen pops) Full liquids the second 24-hours (like potato soup, tomato soup, chicken noodle soup) Bland diet the third 24-hours (boiled and baked foods, no fried or greasy foods) Avoid milk, cheese, ice cream and dairy products  for 72 hours. Avoid caffeine (cola drinks, coffee, tea, Mountain Dew, Mellow Yellow) Take in small amounts, but frequently. Tylenol  as needed for aches pains and fever  Take Zantac 150 twice daily after breakfast and supper and for a short period of time hold the Prilosec We will arrange for you to have an appointment with the cardiologist to evaluate your chest tightness because of your strong family history of heart disease Hold the testosterone  until after  you see the hematologist  Continue to drink plenty of fluids, especially water, daily after the above diet is completed but leave off the caffeine Eat foods that are more bland At some point in time when we get the diarrhea cleared up, you need to have an advanced lipid profile    Tick Bite Information Ticks are insects that attach themselves to the skin and draw blood for food. There are various types of ticks. Common types include wood ticks and deer ticks. Most ticks live in shrubs and grassy areas. Ticks can climb onto your body when you make contact with leaves or grass where the tick is waiting. The most common places on the body for ticks to attach themselves are the scalp, neck, armpits, waist, and groin. Most tick bites are harmless, but sometimes ticks carry germs that cause diseases. These germs can be spread to a person during the tick's feeding process. The chance of a disease spreading through a tick bite depends on:   The type of tick.  Time of year.   How long the tick is attached.   Geographic location.  HOW CAN YOU PREVENT TICK BITES? Take these steps to help prevent tick bites when you are outdoors:  Wear protective clothing. Long sleeves and long pants are best.   Wear white clothes so you can see ticks more easily.  Tuck your pant legs into your socks.   If walking on a trail, stay in the middle of the trail to avoid brushing against bushes.  Avoid walking through areas with long grass.  Put insect repellent on all exposed skin and along boot tops, pant legs, and sleeve cuffs.   Check clothing, hair, and skin repeatedly and before going inside.   Brush off any ticks that are not attached.  Take a shower or bath as soon as possible after being outdoors.  WHAT IS THE PROPER WAY TO REMOVE A TICK? Ticks should be removed as soon as possible to help prevent diseases caused by tick bites. 1. If latex gloves are available, put them on before trying to  remove a tick.  2. Using fine-point tweezers, grasp the tick as close to the skin as possible. You may also use curved forceps or a tick removal tool. Grasp the tick as close to its head as possible. Avoid grasping the tick on its body. 3. Pull gently with steady upward pressure until the tick lets go. Do not twist the tick or jerk it suddenly. This may break off the tick's head or mouth parts. 4. Do not squeeze or crush the tick's body. This could force disease-carrying fluids from the tick into your body.  5. After the tick is removed, wash the bite area and your hands with soap and water or other disinfectant such as alcohol. 6. Apply a small amount of antiseptic cream or ointment to the bite site.  7. Wash and disinfect any instruments that were used.  Do not try to remove a tick by applying a hot match, petroleum  jelly, or fingernail polish to the tick. These methods do not work and may increase the chances of disease being spread from the tick bite.  WHEN SHOULD YOU SEEK MEDICAL CARE? Contact your health care provider if you are unable to remove a tick from your skin or if a part of the tick breaks off and is stuck in the skin.  After a tick bite, you need to be aware of signs and symptoms that could be related to diseases spread by ticks. Contact your health care provider if you develop any of the following in the days or weeks after the tick bite:  Unexplained fever.  Rash. A circular rash that appears days or weeks after the tick bite may indicate the possibility of Lyme disease. The rash may resemble a target with a bull's-eye and may occur at a different part of your body than the tick bite.  Redness and swelling in the area of the tick bite.   Tender, swollen lymph glands.   Diarrhea.   Weight loss.   Cough.   Fatigue.   Muscle, joint, or bone pain.   Abdominal pain.   Headache.   Lethargy or a change in your level of consciousness.  Difficulty walking or  moving your legs.   Numbness in the legs.   Paralysis.  Shortness of breath.   Confusion.   Repeated vomiting.  Document Released: 04/04/2000 Document Revised: 01/26/2013 Document Reviewed: 09/15/2012 [facility] Patient Information 2015 [place], [place]. This information is not intended to replace advice given to you by your health care provider. Make sure you discuss any questions you have with your health care provider.    Arrie Senate MD

## 2013-10-26 NOTE — Addendum Note (Signed)
Addended by: Monica BectonHODGES, Prynce Jacober F on: 10/26/2013 06:13 PM   Modules accepted: Orders

## 2013-10-26 NOTE — Patient Instructions (Signed)
Clear liquids for 24 hours (like 7-Up, ginger ale, Sprite, Jello, frozen pops) Full liquids the second 24-hours (like potato soup, tomato soup, chicken noodle soup) Bland diet the third 24-hours (boiled and baked foods, no fried or greasy foods) Avoid milk, cheese, ice cream and dairy products for 72 hours. Avoid caffeine (cola drinks, coffee, tea, Mountain Dew, Mellow Yellow) Take in small amounts, but frequently. Tylenol  as needed for aches pains and fever  Take Zantac 150 twice daily after breakfast and supper and for a short period of time hold the Prilosec We will arrange for you to have an appointment with the cardiologist to evaluate your chest tightness because of your strong family history of heart disease Hold the testosterone  until after you see the hematologist  Continue to drink plenty of fluids, especially water, daily after the above diet is completed but leave off the caffeine Eat foods that are more bland At some point in time when we get the diarrhea cleared up, you need to have an advanced lipid profile    Tick Bite Information Ticks are insects that attach themselves to the skin and draw blood for food. There are various types of ticks. Common types include wood ticks and deer ticks. Most ticks live in shrubs and grassy areas. Ticks can climb onto your body when you make contact with leaves or grass where the tick is waiting. The most common places on the body for ticks to attach themselves are the scalp, neck, armpits, waist, and groin. Most tick bites are harmless, but sometimes ticks carry germs that cause diseases. These germs can be spread to a person during the tick's feeding process. The chance of a disease spreading through a tick bite depends on:   The type of tick.  Time of year.   How long the tick is attached.   Geographic location.  HOW CAN YOU PREVENT TICK BITES? Take these steps to help prevent tick bites when you are outdoors:  Wear protective  clothing. Long sleeves and long pants are best.   Wear white clothes so you can see ticks more easily.  Tuck your pant legs into your socks.   If walking on a trail, stay in the middle of the trail to avoid brushing against bushes.  Avoid walking through areas with long grass.  Put insect repellent on all exposed skin and along boot tops, pant legs, and sleeve cuffs.   Check clothing, hair, and skin repeatedly and before going inside.   Brush off any ticks that are not attached.  Take a shower or bath as soon as possible after being outdoors.  WHAT IS THE PROPER WAY TO REMOVE A TICK? Ticks should be removed as soon as possible to help prevent diseases caused by tick bites. 1. If latex gloves are available, put them on before trying to remove a tick.  2. Using fine-point tweezers, grasp the tick as close to the skin as possible. You may also use curved forceps or a tick removal tool. Grasp the tick as close to its head as possible. Avoid grasping the tick on its body. 3. Pull gently with steady upward pressure until the tick lets go. Do not twist the tick or jerk it suddenly. This may break off the tick's head or mouth parts. 4. Do not squeeze or crush the tick's body. This could force disease-carrying fluids from the tick into your body.  5. After the tick is removed, wash the bite area and your hands with  soap and water or other disinfectant such as alcohol. 6. Apply a small amount of antiseptic cream or ointment to the bite site.  7. Wash and disinfect any instruments that were used.  Do not try to remove a tick by applying a hot match, petroleum jelly, or fingernail polish to the tick. These methods do not work and may increase the chances of disease being spread from the tick bite.  WHEN SHOULD YOU SEEK MEDICAL CARE? Contact your health care provider if you are unable to remove a tick from your skin or if a part of the tick breaks off and is stuck in the skin.  After a  tick bite, you need to be aware of signs and symptoms that could be related to diseases spread by ticks. Contact your health care provider if you develop any of the following in the days or weeks after the tick bite:  Unexplained fever.  Rash. A circular rash that appears days or weeks after the tick bite may indicate the possibility of Lyme disease. The rash may resemble a target with a bull's-eye and may occur at a different part of your body than the tick bite.  Redness and swelling in the area of the tick bite.   Tender, swollen lymph glands.   Diarrhea.   Weight loss.   Cough.   Fatigue.   Muscle, joint, or bone pain.   Abdominal pain.   Headache.   Lethargy or a change in your level of consciousness.  Difficulty walking or moving your legs.   Numbness in the legs.   Paralysis.  Shortness of breath.   Confusion.   Repeated vomiting.  Document Released: 04/04/2000 Document Revised: 01/26/2013 Document Reviewed: 09/15/2012 [facility] Patient Information 2015 [employer] [employer]. This information is not intended to replace advice given to you by your health care provider. Make sure you discuss any questions you have with your health care provider.

## 2013-10-27 ENCOUNTER — Other Ambulatory Visit: Payer: BC Managed Care – PPO

## 2013-10-27 DIAGNOSIS — R197 Diarrhea, unspecified: Secondary | ICD-10-CM

## 2013-10-28 LAB — HEPATIC FUNCTION PANEL
ALK PHOS: 56 IU/L (ref 39–117)
ALT: 38 IU/L (ref 0–44)
AST: 27 IU/L (ref 0–40)
Albumin: 4.7 g/dL (ref 3.5–5.5)
Bilirubin, Direct: 0.09 mg/dL (ref 0.00–0.40)
Total Bilirubin: 0.3 mg/dL (ref 0.0–1.2)
Total Protein: 6.8 g/dL (ref 6.0–8.5)

## 2013-10-28 LAB — BMP8+EGFR
BUN / CREAT RATIO: 9 (ref 9–20)
BUN: 11 mg/dL (ref 6–24)
CO2: 23 mmol/L (ref 18–29)
Calcium: 9.7 mg/dL (ref 8.7–10.2)
Chloride: 102 mmol/L (ref 97–108)
Creatinine, Ser: 1.21 mg/dL (ref 0.76–1.27)
GFR calc non Af Amer: 71 mL/min/{1.73_m2} (ref >59)
GFR, EST AFRICAN AMERICAN: 82 mL/min/{1.73_m2} (ref >59)
Glucose: 94 mg/dL (ref 65–99)
Potassium: 4.7 mmol/L (ref 3.5–5.2)
Sodium: 141 mmol/L (ref 134–144)

## 2013-10-28 LAB — CBC WITH DIFFERENTIAL
Basophils Absolute: 0 10*3/uL (ref 0.0–0.2)
Basos: 1 %
Eos: 3 %
Eosinophils Absolute: 0.2 10*3/uL (ref 0.0–0.4)
HEMATOCRIT: 47.4 % (ref 37.5–51.0)
Hemoglobin: 16.7 g/dL (ref 12.6–17.7)
IMMATURE GRANS (ABS): 0 10*3/uL (ref 0.0–0.1)
IMMATURE GRANULOCYTES: 0 %
Lymphocytes Absolute: 1.7 10*3/uL (ref 0.7–3.1)
Lymphs: 24 %
MCH: 30.2 pg (ref 26.6–33.0)
MCHC: 35.2 g/dL (ref 31.5–35.7)
MCV: 86 fL (ref 79–97)
MONOCYTES: 9 %
Monocytes Absolute: 0.6 10*3/uL (ref 0.1–0.9)
NEUTROS ABS: 4.3 10*3/uL (ref 1.4–7.0)
Neutrophils Relative %: 63 %
Platelets: 347 10*3/uL (ref 150–379)
RBC: 5.53 x10E6/uL (ref 4.14–5.80)
RDW: 13.6 % (ref 12.3–15.4)
WBC: 6.8 10*3/uL (ref 3.4–10.8)

## 2013-10-28 LAB — RMSF, IGG, IFA

## 2013-10-28 LAB — LYME AB/WESTERN BLOT REFLEX

## 2013-10-28 LAB — ROCKY MTN SPOTTED FVR ABS PNL(IGG+IGM)
RMSF IGM: 0.29 {index} (ref 0.00–0.89)
RMSF IgG: POSITIVE — AB

## 2013-10-29 ENCOUNTER — Telehealth: Payer: Self-pay | Admitting: *Deleted

## 2013-10-29 MED ORDER — DOXYCYCLINE HYCLATE 100 MG PO TABS: 100.0000 mg | ORAL_TABLET | Freq: Two times a day (BID) | ORAL | Status: DC

## 2013-10-29 NOTE — Telephone Encounter (Signed)
Spoke with patient and the diarrhea has improved. He has not had any since early Friday am. Patient aware of results and verbalizes understanding

## 2013-10-29 NOTE — Telephone Encounter (Signed)
Message copied by Tamera PuntWRAY, WENDY S on Sat Oct 29, 2013  9:50 AM ------      Message from: Ernestina PennaMOORE, DONALD W      Created: Sat Oct 29, 2013  9:14 AM       The blood sugar, is good at 94. The creatinine the most important kidney function test is within normal limits. Electrolytes including potassium are within normal limits.      All liver function tests are within normal limits      The titer for Lyme disease is negative      [facility] spotted [facility] is positive indicating a recent or active infection--please call in prescription for doxycycline 100 mg, one twice daily for 2 weeks +++++ ------

## 2013-10-30 LAB — CLOSTRIDIUM DIFFICILE BY PCR: Toxigenic C. Difficile by PCR: NEGATIVE

## 2013-10-31 LAB — STOOL CULTURE: E coli, Shiga toxin Assay: NEGATIVE

## 2013-10-31 LAB — OVA AND PARASITE EXAMINATION

## 2013-11-01 ENCOUNTER — Other Ambulatory Visit: Payer: Self-pay | Admitting: *Deleted

## 2013-11-01 ENCOUNTER — Encounter: Payer: Self-pay | Admitting: Family Medicine

## 2013-11-01 DIAGNOSIS — R0789 Other chest pain: Secondary | ICD-10-CM

## 2013-11-01 DIAGNOSIS — Z8249 Family history of ischemic heart disease and other diseases of the circulatory system: Secondary | ICD-10-CM

## 2013-11-08 ENCOUNTER — Encounter: Payer: Self-pay | Admitting: Oncology

## 2013-11-08 ENCOUNTER — Ambulatory Visit (INDEPENDENT_AMBULATORY_CARE_PROVIDER_SITE_OTHER): Payer: BC Managed Care – PPO | Admitting: Oncology

## 2013-11-08 VITALS — BP 116/82 | HR 85 | Temp 97.3°F | Ht 71.0 in | Wt 225.2 lb

## 2013-11-08 DIAGNOSIS — R945 Abnormal results of liver function studies: Secondary | ICD-10-CM | POA: Insufficient documentation

## 2013-11-08 DIAGNOSIS — R7989 Other specified abnormal findings of blood chemistry: Secondary | ICD-10-CM

## 2013-11-08 HISTORY — DX: Other specified abnormal findings of blood chemistry: R79.89

## 2013-11-08 LAB — IRON AND TIBC
%SAT: 36 % (ref 20–55)
IRON: 133 ug/dL (ref 42–165)
TIBC: 365 ug/dL (ref 215–435)
UIBC: 232 ug/dL (ref 125–400)

## 2013-11-08 LAB — FERRITIN: FERRITIN: 514 ng/mL — AB (ref 22–322)

## 2013-11-08 NOTE — Progress Notes (Signed)
Patient ID: Scott Rasmussen, male   DOB: [birth year], 48 y.o.   MRN: [id number] New Patient Hematology   Scott Rasmussen [id number] 05/04/1965 48 y.o. 11/08/2013  CC: Dr. Rudi Heap    Reason for referral: Elevated ferritin   HPI:  48 year old man in overall excellent health without any major medical or surgical illness. About one year ago in September 2014 , he presented to his family physician with complaints of nonspecific fatigue. He was not anemic. He was found to have a decreased testosterone level. He was started on Depo-Testosterone injections 200 mg IM monthly. He has not had an improvement in his energy level. He never had any erectile dysfunction even prior to starting the injections.  Back in May, he developed a folliculitis on the skin of his posterior scalp and was put on a 10 day course of doxycycline 100 mg twice daily. On May 28, the last day of the prescribed doxycycline, he noted a tick bite in his genital region. The tick was removed with a tweezer. He saw his primary care physician. Laboratory studies were done and he tested positive for exposure to [facility] spotted fever with an IgG titer of 1:128 consistent with a recent or active infection. Negative for Lyme serology.  Additional laboratory done at that time included a CBC which showed hemoglobin 17.9, hematocrit 51.7, MCV 86.6, white count 6100, 50% neutrophils, 33 lymphocytes, 11 monocytes, platelets 311,000. Liver functions were normal SGOT 27 SGPT 38 bilirubin 0.3 alkaline phosphatase 56. Iron was high normal at 132 (40 to-163), TIBC 300, percent saturation 44%, ferritin 587.  He recently took a vacation to [place] with his family. He returned at the end of June. A day or 2 after his return he developed diarrhea,  primarily postprandial. He  did eat raw oysters while he was there but other family members who ate the oysters did not get ill. He saw his family physician again. Routine stool cultures, stool for ova  and  parasites, and C. difficile testing were all negative. Symptoms resolved on a bland diet. He was put back on another 14 day course of doxycycline which he will complete later this week.  His wife who is a Armed forces technical officer was concerned with his elevated hemoglobin and ferritin and requested further evaluation. Marland Kitchen  PMH: Past Medical History  Diagnosis Date  . Shortness of breath   . Gout   . Umbilical hernia   . Sleep apnea   No history of hypertension, MI, diabetes, ulcers, hepatitis, yellow jaundice, malaria, mononucleosis, thyroid disease, kidney disease, seizure, stroke, inflammatory arthritis.  Past Surgical History  Procedure Laterality Date  . Tonsillectomy and adenoidectomy    . Uvulopalatopharyngoplasty    Above surgery done for sleep apnea syndrome.  Allergies: Allergies  Allergen Reactions  . Depo-Medrol [Methylprednisolone Acetate]  nausea-likely not a allergy     Medications: Current outpatient prescriptions:allopurinol (ZYLOPRIM) 300 MG tablet, Take 1 tablet (300 mg total) by mouth daily., Disp: 90 tablet, Rfl: 3;  doxycycline (VIBRA-TABS) 100 MG tablet, Take 1 tablet (100 mg total) by mouth 2 (two) times daily., Disp: 28 tablet, Rfl: 0;  eszopiclone (LUNESTA) 2 MG TABS, Take immediately before bedtime prn insomnia, Disp: 30 tablet, Rfl: 3 fenofibrate (TRICOR) 145 MG tablet, Take 1 tablet (145 mg total) by mouth daily., Disp: 90 tablet, Rfl: 3;  loratadine (CLARITIN) 10 MG tablet, Take 1 tablet (10 mg total) by mouth daily., Disp: 90 tablet, Rfl: 2;  omega-3 acid ethyl esters (  LOVAZA) 1 G capsule, Take 2 capsules (2 g total) by mouth 2 (two) times daily., Disp: 180 capsule, Rfl: 3 omeprazole (PRILOSEC) 40 MG capsule, Take 1 capsule (40 mg total) by mouth daily., Disp: 90 capsule, Rfl: 3;  testosterone cypionate (DEPOTESTOTERONE CYPIONATE) 200 MG/ML injection, Inject 1 mL (200 mg total) into the muscle every 28 (twenty-eight) days., Disp: 10 mL, Rfl: 3 Current  facility-administered medications:testosterone cypionate (DEPOTESTOTERONE CYPIONATE) injection 200 mg, 200 mg, Intramuscular, Q28 days, Deatra CanterWilliam J Oxford, FNP  Social History: Married. 2 children from his first marriage. 3 stepchildren with his second marriage. 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  He  quit smoking 3 years ago.. Rare alcoholic beverage. No history of IV drug use.  Family History: Family History  Problem Relation Age of Onset  . Heart disease Father     heart attack    Review of Systems: Unremarkable except as noted in the history of present illness.   Physical Exam: Blood pressure 116/82, pulse 85, temperature 97.3 F (36.3 C), temperature source Oral, height 5\' 11"  (1.803 m), weight 225 lb 3.2 oz (102.15 kg), SpO2 96.00%. Wt Readings from Last 3 Encounters:  11/08/13 225 lb 3.2 oz (102.15 kg)  10/26/13 225 lb 9.6 oz (102.331 kg)  09/08/13 221 lb 12.8 oz (100.608 kg)     General appearance: Well-nourished Caucasian man HENNT: Pharynx no erythema, exudate, mass, or ulcer. No thyromegaly or thyroid nodules Lymph nodes: No cervical, supraclavicular, or axillary lymphadenopathy Breasts:  Lungs: Clear to auscultation, resonant to percussion throughout Heart: Regular rhythm, no murmur, no gallop, no rub, no click, no edema Abdomen: Soft, nontender, normal bowel sounds, no mass, no organomegaly Extremities: No edema, no calf tenderness Musculoskeletal: no joint deformities GU:  Vascular: Carotid pulses 2+, no bruits, Neurologic: Alert, oriented, PERRLA, optic discs sharp and vessels normal, no hemorrhage or exudate, cranial nerves grossly normal, motor strength 5 over 5, reflexes 1+ symmetric, upper body coordination normal, gait normal, vibration sensation normal to tuning fork exam  Skin: No rash or ecchymosis. No increased pigmentation of the skin or skin folds.    Lab Results: Lab Results  Component Value Date   WBC 6.8 10/26/2013   HGB 16.7  10/26/2013   HCT 47.4 10/26/2013   MCV 86 10/26/2013   PLT 347 10/26/2013     Chemistry      Component Value Date/Time   NA 141 10/26/2013 1708   K 4.7 10/26/2013 1708   CL 102 10/26/2013 1708   CO2 23 10/26/2013 1708   BUN 11 10/26/2013 1708   CREATININE 1.21 10/26/2013 1708      Component Value Date/Time   CALCIUM 9.7 10/26/2013 1708   ALKPHOS 56 10/26/2013 1708   AST 27 10/26/2013 1708   ALT 38 10/26/2013 1708   BILITOT 0.3 10/26/2013 1708    .    Impression and Plan: #1. Nonspecific elevation of ferritin likely acute phase reactant from recent scalp infection and subsequent tick bite. I not sure whether or not monthly IM injections would also cause enough inflammatory response to elevate the ferritin. Iron saturation above normal but does not meet the usual criteria for hemachromatosis. Plan: I will repeat the ferritin. I will go ahead and check the hemachromatosis gene. I will call him with results.  #2. Elevated hemoglobin: Almost certainly due to the anabolic effect of the testosterone injections. No further evaluation indicated.      Levert FeinsteinGRANFORTUNA,Sargent M, MD 11/08/2013, 10:54 AM

## 2013-11-08 NOTE — Patient Instructions (Signed)
To lab today to check iron panel and gene test for increased iron absorption We will call you with results Return visit if needed based on test results

## 2013-11-09 LAB — SEDIMENTATION RATE: Sed Rate: 1 mm/hr (ref 0–16)

## 2013-11-11 LAB — HEMOCHROMATOSIS DNA-PCR(C282Y,H63D)

## 2013-11-14 ENCOUNTER — Encounter: Payer: Self-pay | Admitting: Family Medicine

## 2013-11-28 ENCOUNTER — Encounter: Payer: Self-pay | Admitting: Family Medicine

## 2013-11-28 ENCOUNTER — Ambulatory Visit (INDEPENDENT_AMBULATORY_CARE_PROVIDER_SITE_OTHER): Payer: BC Managed Care – PPO | Admitting: Family Medicine

## 2013-11-28 VITALS — BP 120/85 | HR 74 | Temp 97.8°F | Ht 71.0 in | Wt 224.0 lb

## 2013-11-28 DIAGNOSIS — E790 Hyperuricemia without signs of inflammatory arthritis and tophaceous disease: Secondary | ICD-10-CM

## 2013-11-28 DIAGNOSIS — R7989 Other specified abnormal findings of blood chemistry: Secondary | ICD-10-CM

## 2013-11-28 DIAGNOSIS — E291 Testicular hypofunction: Secondary | ICD-10-CM

## 2013-11-28 DIAGNOSIS — K2100 Gastro-esophageal reflux disease with esophagitis, without bleeding: Secondary | ICD-10-CM

## 2013-11-28 DIAGNOSIS — L723 Sebaceous cyst: Secondary | ICD-10-CM

## 2013-11-28 DIAGNOSIS — H60399 Other infective otitis externa, unspecified ear: Secondary | ICD-10-CM

## 2013-11-28 DIAGNOSIS — L089 Local infection of the skin and subcutaneous tissue, unspecified: Secondary | ICD-10-CM

## 2013-11-28 DIAGNOSIS — H6092 Unspecified otitis externa, left ear: Secondary | ICD-10-CM

## 2013-11-28 DIAGNOSIS — E559 Vitamin D deficiency, unspecified: Secondary | ICD-10-CM

## 2013-11-28 DIAGNOSIS — E785 Hyperlipidemia, unspecified: Secondary | ICD-10-CM

## 2013-11-28 LAB — POCT CBC
Granulocyte percent: 68.5 %G (ref 37–80)
HCT, POC: 53.7 % (ref 43.5–53.7)
Hemoglobin: 17.2 g/dL (ref 14.1–18.1)
LYMPH, POC: 1.7 (ref 0.6–3.4)
MCH: 28.4 pg (ref 27–31.2)
MCHC: 32.1 g/dL (ref 31.8–35.4)
MCV: 88.7 fL (ref 80–97)
MPV: 9.5 fL (ref 0–99.8)
PLATELET COUNT, POC: 264 10*3/uL (ref 142–424)
POC Granulocyte: 4 (ref 2–6.9)
POC LYMPH %: 29.3 % (ref 10–50)
RBC: 6.1 M/uL (ref 4.69–6.13)
RDW, POC: 13.2 %
WBC: 5.8 10*3/uL (ref 4.6–10.2)

## 2013-11-28 MED ORDER — NEOMYCIN-POLYMYXIN-HC 3.5-10000-1 OT SOLN: OTIC | Status: DC

## 2013-11-28 MED ORDER — CEPHALEXIN 500 MG PO CAPS: 500.0000 mg | ORAL_CAPSULE | Freq: Three times a day (TID) | ORAL | Status: DC

## 2013-11-28 NOTE — Patient Instructions (Signed)
Use the Cortisporin otic eardrops as directed with a cotton wick for 7-10 days Use the earplugs to keep water out of the ears while taking a shower or swimming Take the antibiotic as directed for the cyst/infected on your left upper thigh Leave off the allopurinol and the testosterone We will check a uric acid level and a testosterone level with the blood work today If you have a flareup of gout please call us We would like to check another uric acid in about 6 weeks.

## 2013-11-28 NOTE — Progress Notes (Signed)
Subjective:    Patient ID: Scott Rasmussen, male    DOB: [birth year], 48 y.o.   MRN: [id number]  HPI Patient here today for left groin/thigh sore and left ear itching. It is important to note this patient has a history of gout, a strong family history of heart disease, and is just getting over [facility] spotted fever. His antibiotics [facility] spotted fever was completed a couple weeks ago. He has been off of his allopurinol for about 2 weeks. He has been off of testosterone for about 6 weeks. He complains of this area of on his medial left thigh for about 2 months. He also complains of his left ear itching and being irritated for a good while. He is due to see the cardiologist on the 24th of this month. He is seeing him now because he is having problems, but because he has a strong family history of heart disease and that his father died at 35 years of age of a heart attack. His father did not smoke.        Patient Active Problem List   Diagnosis Date Noted  . Abnormal liver function test 11/08/2013  . Obesity (BMI 30.0-34.9) 01/03/2013  . Gout 11/26/2012  . Hyperlipidemia 11/26/2012  . GERD (gastroesophageal reflux disease) 11/26/2012   Outpatient Encounter Prescriptions as of 11/28/2013  Medication Sig  . fenofibrate (TRICOR) 145 MG tablet Take 1 tablet (145 mg total) by mouth daily.  Marland Kitchen loratadine (CLARITIN) 10 MG tablet Take 1 tablet (10 mg total) by mouth daily.  Marland Kitchen omega-3 acid ethyl esters (LOVAZA) 1 G capsule Take 2 capsules (2 g total) by mouth 2 (two) times daily.  Marland Kitchen omeprazole (PRILOSEC) 40 MG capsule Take 1 capsule (40 mg total) by mouth daily.  Marland Kitchen allopurinol (ZYLOPRIM) 300 MG tablet Take 1 tablet (300 mg total) by mouth daily.  . eszopiclone (LUNESTA) 2 MG TABS Take immediately before bedtime prn insomnia  . testosterone cypionate (DEPOTESTOTERONE CYPIONATE) 200 MG/ML injection Inject 1 mL (200 mg total) into the muscle every 28 (twenty-eight) days.  . [DISCONTINUED]  doxycycline (VIBRA-TABS) 100 MG tablet Take 1 tablet (100 mg total) by mouth 2 (two) times daily.    Review of Systems  Constitutional: Negative.   HENT: Negative.        Left ear itches  Eyes: Negative.   Respiratory: Negative.   Cardiovascular: Negative.   Gastrointestinal: Negative.   Endocrine: Negative.   Genitourinary: Negative.   Musculoskeletal: Negative.   Skin: Negative.        Knot or sore in left thigh region  Allergic/Immunologic: Negative.   Neurological: Negative.   Hematological: Negative.   Psychiatric/Behavioral: Negative.        Objective:   Physical Exam  Nursing note and vitals reviewed. Constitutional: He is oriented to person, place, and time. He appears well-developed and well-nourished. No distress.  HENT:  Head: Normocephalic and atraumatic.  Right Ear: External ear normal.  Nose: Nose normal.  Mouth/Throat: Oropharynx is clear and moist. No oropharyngeal exudate.   there is some skin and debris and discoloration in the left ear canal. The eardrum is normal.  Eyes: Conjunctivae and EOM are normal. Pupils are equal, round, and reactive to light. Right eye exhibits no discharge. Left eye exhibits no discharge. No scleral icterus.  Neck: Normal range of motion. Neck supple. No thyromegaly present.  Cardiovascular: Normal rate, regular rhythm, normal heart sounds and intact distal pulses.   No murmur heard. Pulmonary/Chest: Effort normal and  breath sounds normal. No respiratory distress. He has no wheezes. He has no rales. He exhibits no tenderness.  Abdominal: Soft. Bowel sounds are normal. He exhibits no mass. There is no tenderness. There is no rebound and no guarding.  Musculoskeletal: Normal range of motion. He exhibits no edema.  Lymphadenopathy:    He has no cervical adenopathy.  Neurological: He is alert and oriented to person, place, and time.  Skin: Skin is warm and dry. No rash noted. No erythema. No pallor.  There is a small cyst that is  somewhat inflamed almost in the groin of the left medial thigh. There is no drainage from the cyst. There is no abscess formation.  Psychiatric: He has a normal mood and affect. His behavior is normal. Judgment and thought content normal.    BP 120/85  Pulse 74  Temp(Src) 97.8 F (36.6 C) (Oral)  Ht 5\' 11"  (1.803 m)  Wt 224 lb (101.606 kg)  BMI 31.26 kg/m2       Assessment & Plan:  1. Infected sebaceous cyst - cephALEXin (KEFLEX) 500 MG capsule; Take 1 capsule (500 mg total) by mouth 3 (three) times daily.  Dispense: 30 capsule; Refill: 1  2. Left otitis externa - neomycin-polymyxin-hydrocortisone (CORTISPORIN) otic solution; Use 3-4 drops 4 times daily with a cotton wick to the left ear as directed x7-10 day  Dispense: 10 mL; Refill: 0  3. Low testosterone  4. Gastroesophageal reflux disease with esophagitis  5. Hyperuricemia  Labs today will include advanced lipid testing, a BMP, a CBC, thyroid panel, a uric acid, and LFTs. We will also do a testosterone level.  Patient Instructions  Use the Cortisporin otic eardrops as directed with a cotton wick for 7-10 days Use the earplugs to keep water out of the ears while taking a shower or swimming Take the antibiotic as directed for the cyst/infected on your left upper thigh Leave off the allopurinol and the testosterone We will check a uric acid level and a testosterone level with the blood work today If you have a flareup of gout please call us We would like to check another uric acid in about 6 weeks.   Nyra Capeson W. Jonny Longino MD

## 2013-11-29 LAB — VITAMIN D 25 HYDROXY (VIT D DEFICIENCY, FRACTURES): VIT D 25 HYDROXY: 32.3 ng/mL (ref 30.0–100.0)

## 2013-11-29 LAB — NMR, LIPOPROFILE
CHOLESTEROL: 150 mg/dL (ref 100–199)
HDL CHOLESTEROL BY NMR: 36 mg/dL — AB (ref >39)
HDL PARTICLE NUMBER: 35.1 umol/L (ref >=30.5)
LDL Particle Number: 1238 nmol/L — ABNORMAL HIGH (ref <1000)
LDL Size: 20.3 nm (ref >20.5)
LDLC SERPL CALC-MCNC: 86 mg/dL (ref 0–99)
LP-IR Score: 82 — ABNORMAL HIGH (ref <=45)
SMALL LDL PARTICLE NUMBER: 783 nmol/L — AB (ref <=527)
Triglycerides by NMR: 142 mg/dL (ref 0–149)

## 2013-11-29 LAB — BMP8+EGFR
BUN/Creatinine Ratio: 14 (ref 9–20)
BUN: 16 mg/dL (ref 6–24)
CALCIUM: 9.9 mg/dL (ref 8.7–10.2)
CHLORIDE: 102 mmol/L (ref 97–108)
CO2: 22 mmol/L (ref 18–29)
Creatinine, Ser: 1.12 mg/dL (ref 0.76–1.27)
GFR calc Af Amer: 91 mL/min/{1.73_m2} (ref >59)
GFR calc non Af Amer: 78 mL/min/{1.73_m2} (ref >59)
Glucose: 87 mg/dL (ref 65–99)
POTASSIUM: 4.5 mmol/L (ref 3.5–5.2)
SODIUM: 140 mmol/L (ref 134–144)

## 2013-11-29 LAB — HEPATIC FUNCTION PANEL
ALT: 25 IU/L (ref 0–44)
AST: 21 IU/L (ref 0–40)
Albumin: 4.9 g/dL (ref 3.5–5.5)
Alkaline Phosphatase: 51 IU/L (ref 39–117)
BILIRUBIN DIRECT: 0.11 mg/dL (ref 0.00–0.40)
Total Bilirubin: 0.4 mg/dL (ref 0.0–1.2)
Total Protein: 6.7 g/dL (ref 6.0–8.5)

## 2013-11-29 LAB — TESTOSTERONE,FREE AND TOTAL
TESTOSTERONE: 357 ng/dL (ref 348–1197)
Testosterone, Free: 11.2 pg/mL (ref 6.8–21.5)

## 2013-11-29 LAB — URIC ACID: URIC ACID: 7.6 mg/dL (ref 3.7–8.6)

## 2013-12-12 ENCOUNTER — Ambulatory Visit (INDEPENDENT_AMBULATORY_CARE_PROVIDER_SITE_OTHER): Payer: BC Managed Care – PPO | Admitting: Internal Medicine

## 2013-12-12 ENCOUNTER — Ambulatory Visit (INDEPENDENT_AMBULATORY_CARE_PROVIDER_SITE_OTHER)
Admission: RE | Admit: 2013-12-12 | Discharge: 2013-12-12 | Disposition: A | Discharge status: Home / Self Care | Payer: Self-pay | Source: Ambulatory Visit | Attending: Internal Medicine | Admitting: Internal Medicine

## 2013-12-12 VITALS — BP 128/82 | HR 92 | Ht 72.0 in | Wt 226.0 lb

## 2013-12-12 DIAGNOSIS — R079 Chest pain, unspecified: Secondary | ICD-10-CM

## 2013-12-12 DIAGNOSIS — Z8249 Family history of ischemic heart disease and other diseases of the circulatory system: Secondary | ICD-10-CM

## 2013-12-12 NOTE — Progress Notes (Signed)
HPI [place] exercise 3x per wk  Does 2 miles  [place] and run.  No change in ability to do that. Occasional CP but not with activty No SOB Father died of massive MI at 46 yo Allergies  Allergen Reactions  . Depo-Medrol [Methylprednisolone Acetate] Rash    Current Outpatient Prescriptions  Medication Sig Dispense Refill  . cephALEXin (KEFLEX) 500 MG capsule Take 1 capsule (500 mg total) by mouth 3 (three) times daily.  30 capsule  1  . eszopiclone (LUNESTA) 2 MG TABS Take immediately before bedtime prn insomnia  30 tablet  3  . fenofibrate (TRICOR) 145 MG tablet Take 1 tablet (145 mg total) by mouth daily.  90 tablet  3  . loratadine (CLARITIN) 10 MG tablet Take 1 tablet (10 mg total) by mouth daily.  90 tablet  2  . omega-3 acid ethyl esters (LOVAZA) 1 G capsule Take 2 capsules (2 g total) by mouth 2 (two) times daily.  180 capsule  3  . omeprazole (PRILOSEC) 40 MG capsule Take 1 capsule (40 mg total) by mouth daily.  90 capsule  3   No current facility-administered medications for this visit.    Past Medical History  Diagnosis Date  . Shortness of breath   . Gout   . Umbilical hernia   . Sleep apnea     Past Surgical History  Procedure Laterality Date  . Tonsillectomy and adenoidectomy    . Uvulopalatopharyngoplasty      Family History  Problem Relation Age of Onset  . Heart disease Father     heart attack    History   Social History  . Marital Status: Married    Spouse Name: N/A    Number of Children: N/A  . Years of Education: N/A   Occupational History  . Not on file.   Social History Main Topics  . Smoking status: Former Games developer  . Smokeless tobacco: Not on file  . Alcohol Use: Yes     Comment: Rarely.  . Drug Use: No  . Sexual Activity: Not on file   Other Topics Concern  . Not on file   Social History Narrative  . No narrative on file    Review of Systems:  All systems reviewed.  They are negative to the above problem except as previously  stated.  Vital Signs: BP 128/82  Pulse 92  Ht 6' (1.829 m)  Wt 226 lb (102.513 kg)  BMI 30.64 kg/m2  Physical Exam [place] in NAD HEENT:  Normocephalic, atraumatic. EOMI, PERRLA.  Neck: JVP is normal.  No bruits.  Lungs: clear to auscultation. No rales no wheezes.  Heart: Regular rate and rhythm. Normal S1, S2. No S3.   No significant murmurs. PMI not displaced.  Abdomen:  Supple, nontender. Normal bowel sounds. No masses. No hepatomegaly.  Extremities:   Good distal pulses throughout. No lower extremity edema.  Musculoskeletal :moving all extremities.  Neuro:   alert and oriented x3.  CN II-XII grossly intact.   Assessment and Plan: 1.  CP  I am not convinced cardiac in origin   2.  Cardiac risk stratification.  Patient's father died at young age  Discussed eval with patient  Will sched Ca score CT 3.  HL  Will review with pharm once CT back

## 2013-12-12 NOTE — Patient Instructions (Signed)
Your physician has recommended a Calcium Score ($150 out of pocket)  Your physician recommends that you continue on your current medications as directed. Please refer to the Current Medication list given to you today.  Your physician wants you to follow-up in: 1 YEAR with Dr Tenny Craw.  You will receive a reminder letter in the mail two months in advance. If you don't receive a letter, please call our office to schedule the follow-up appointment.

## 2013-12-22 ENCOUNTER — Other Ambulatory Visit: Payer: Self-pay | Admitting: *Deleted

## 2013-12-22 MED ORDER — PHENTERMINE HCL 37.5 MG PO CAPS
37.5000 mg | ORAL_CAPSULE | ORAL | Status: DC
Start: 2013-12-22 — End: 2014-01-16

## 2013-12-22 NOTE — Telephone Encounter (Signed)
rx written per [facility] Wt 226  Bp= normal at cardio visit

## 2013-12-29 ENCOUNTER — Other Ambulatory Visit: Payer: Self-pay | Admitting: *Deleted

## 2013-12-29 ENCOUNTER — Encounter: Payer: Self-pay | Admitting: Family Medicine

## 2013-12-29 ENCOUNTER — Telehealth: Payer: Self-pay | Admitting: Family Medicine

## 2013-12-29 DIAGNOSIS — E785 Hyperlipidemia, unspecified: Secondary | ICD-10-CM

## 2013-12-29 MED ORDER — OMEGA-3-ACID ETHYL ESTERS 1 G PO CAPS: 2.0000 g | ORAL_CAPSULE | Freq: Two times a day (BID) | ORAL | Status: DC

## 2013-12-29 MED ORDER — FENOFIBRATE 145 MG PO TABS: 145.0000 mg | ORAL_TABLET | Freq: Every day | ORAL | Status: DC

## 2013-12-29 MED ORDER — OMEPRAZOLE 40 MG PO CPDR: 40.0000 mg | DELAYED_RELEASE_CAPSULE | Freq: Every day | ORAL | Status: DC

## 2013-12-29 NOTE — Telephone Encounter (Signed)
done

## 2014-01-16 ENCOUNTER — Ambulatory Visit (INDEPENDENT_AMBULATORY_CARE_PROVIDER_SITE_OTHER): Payer: BC Managed Care – PPO | Admitting: Family Medicine

## 2014-01-16 ENCOUNTER — Encounter: Payer: Self-pay | Admitting: Family Medicine

## 2014-01-16 VITALS — BP 132/83 | HR 98 | Temp 98.7°F | Ht 71.0 in | Wt 227.0 lb

## 2014-01-16 DIAGNOSIS — E669 Obesity, unspecified: Secondary | ICD-10-CM

## 2014-01-16 DIAGNOSIS — R635 Abnormal weight gain: Secondary | ICD-10-CM

## 2014-01-16 DIAGNOSIS — L729 Follicular cyst of the skin and subcutaneous tissue, unspecified: Secondary | ICD-10-CM

## 2014-01-16 DIAGNOSIS — L723 Sebaceous cyst: Secondary | ICD-10-CM

## 2014-01-16 DIAGNOSIS — Z23 Encounter for immunization: Secondary | ICD-10-CM

## 2014-01-16 DIAGNOSIS — J301 Allergic rhinitis due to pollen: Secondary | ICD-10-CM

## 2014-01-16 DIAGNOSIS — B079 Viral wart, unspecified: Secondary | ICD-10-CM

## 2014-01-16 MED ORDER — PHENTERMINE HCL 37.5 MG PO CAPS: 37.5000 mg | ORAL_CAPSULE | ORAL | Status: DC

## 2014-01-16 MED ORDER — FLUTICASONE PROPIONATE 50 MCG/ACT NA SUSP: 2.0000 | Freq: Every day | NASAL | Status: DC

## 2014-01-16 NOTE — Progress Notes (Signed)
Subjective:    Patient ID: Scott Rasmussen, male    DOB: 1965-11-24, 48 y.o.   MRN: [id number]  HPI Pt is here today for recheck of elevated blood pressure and weight check.            Patient Active Problem List   Diagnosis Date Noted  . Abnormal liver function test 11/08/2013  . Obesity (BMI 30.0-34.9) 01/03/2013  . Gout 11/26/2012  . Hyperlipidemia 11/26/2012  . GERD (gastroesophageal reflux disease) 11/26/2012   Outpatient Encounter Prescriptions as of 01/16/2014  Medication Sig  . eszopiclone (LUNESTA) 2 MG TABS Take immediately before bedtime prn insomnia  . fenofibrate (TRICOR) 145 MG tablet Take 1 tablet (145 mg total) by mouth daily.  Marland Kitchen loratadine (CLARITIN) 10 MG tablet Take 1 tablet (10 mg total) by mouth daily.  Marland Kitchen omega-3 acid ethyl esters (LOVAZA) 1 G capsule Take 2 capsules (2 g total) by mouth 2 (two) times daily.  Marland Kitchen omeprazole (PRILOSEC) 40 MG capsule Take 1 capsule (40 mg total) by mouth daily.  . phentermine 37.5 MG capsule Take 1 capsule (37.5 mg total) by mouth every morning.  . [DISCONTINUED] cephALEXin (KEFLEX) 500 MG capsule Take 1 capsule (500 mg total) by mouth 3 (three) times daily.     Review of Systems  HENT: Positive for postnasal drip and rhinorrhea. Negative for ear pain.   Respiratory: Negative for cough.   Genitourinary: Negative.   Musculoskeletal: Negative.   Skin: Positive for wound (cyst, back right thigh).  Neurological: Negative for dizziness and headaches.       Objective:   Physical Exam  Nursing note and vitals reviewed. Constitutional: He is oriented to person, place, and time. He appears well-developed and well-nourished. No distress.  HENT:  Head: Normocephalic and atraumatic.  Right Ear: External ear normal.  Left Ear: External ear normal.  Mouth/Throat: Oropharynx is clear and moist. No oropharyngeal exudate.  There is nasal congestion and turbinate swelling bilaterally  Eyes: Conjunctivae and EOM are normal.  Pupils are equal, round, and reactive to light. Right eye exhibits no discharge. Left eye exhibits no discharge. No scleral icterus.  Neck: Normal range of motion. Neck supple. No thyromegaly present.  Cardiovascular: Normal rate, regular rhythm and normal heart sounds.   No murmur heard. Pulmonary/Chest: Effort normal and breath sounds normal. No respiratory distress. He has no wheezes. He has no rales. He exhibits no tenderness.  Abdominal: He exhibits no mass.  Musculoskeletal: Normal range of motion. He exhibits no edema and no tenderness.  Lymphadenopathy:    He has no cervical adenopathy.  Neurological: He is alert and oriented to person, place, and time.  Skin: Skin is warm and dry. No rash noted. No erythema. No pallor.  There is a very tiny hair follicle cyst with no erythema and the right groin. No drainage . The patient has a small wart on the left arm which we will freeze with liquid nitrogen  Psychiatric: He has a normal mood and affect. His behavior is normal. Judgment and thought content normal.   BP 132/83  Pulse 98  Temp(Src) 98.7 F (37.1 C) (Oral)  Ht  (1.803 m)  Wt 227 lb (102.967 kg)  BMI 31.67 kg/m2  Cryotherapy to the wart on the left arm without problems         Assessment & Plan:  1. Need for prophylactic vaccination and inoculation against influenza  2. Follicular cyst of skin  3. Obesity (BMI 30.0-34.9)  4. Cutaneous wart  5. Allergic rhinitis due to pollen  Meds ordered this encounter  Medications  . phentermine 37.5 MG capsule    Sig: Take 1 capsule (37.5 mg total) by mouth every morning.    Dispense:  30 capsule    Refill:  0   Patient Instructions  Continue current medications. Continue good therapeutic lifestyle changes which include good diet and exercise. Fall precautions discussed with patient. If an FOBT was given today- please return it to our front desk. If you are over 65 years old - you may need Prevnar 13 or the adult  Pneumonia vaccine.  Flu Shots will be available at our office starting mid- September. Please call and schedule a FLU CLINIC APPOINTMENT.   Continue phentermine, recheck weight in one month and pick up prescription at that time----continue with aggressive diet habits and exercise Wear looser fitting boxer shorts Use Flonase or Nasacort 1-2 sprays each nostril daily for nasal congestion If necessary use nasal saline during the day If the small follicular cyst gets more infected in the right groin, call back and we will call in an antibiotic for this   Nyra Capes MD

## 2014-01-16 NOTE — Patient Instructions (Addendum)
Continue current medications. Continue good therapeutic lifestyle changes which include good diet and exercise. Fall precautions discussed with patient. If an FOBT was given today- please return it to our front desk. If you are over 46 years old - you may need Prevnar 13 or the adult Pneumonia vaccine.  Flu Shots will be available at our office starting mid- September. Please call and schedule a FLU CLINIC APPOINTMENT.   Continue phentermine, recheck weight in one month and pick up prescription at that time----continue with aggressive diet habits and exercise Wear looser fitting boxer shorts Use Flonase or Nasacort 1-2 sprays each nostril daily for nasal congestion If necessary use nasal saline during the day If the small follicular cyst gets more infected in the right groin, call back and we will call in an antibiotic for this

## 2014-01-18 ENCOUNTER — Ambulatory Visit: Payer: BC Managed Care – PPO | Admitting: Family Medicine

## 2014-04-13 ENCOUNTER — Encounter: Payer: Self-pay | Admitting: Family Medicine

## 2014-05-22 ENCOUNTER — Encounter: Payer: Self-pay | Admitting: Family Medicine

## 2014-06-14 ENCOUNTER — Telehealth: Payer: Self-pay | Admitting: Family Medicine

## 2014-06-14 DIAGNOSIS — M2550 Pain in unspecified joint: Secondary | ICD-10-CM

## 2014-06-14 DIAGNOSIS — E559 Vitamin D deficiency, unspecified: Secondary | ICD-10-CM

## 2014-06-14 DIAGNOSIS — E785 Hyperlipidemia, unspecified: Secondary | ICD-10-CM

## 2014-06-15 NOTE — Telephone Encounter (Signed)
Pt aware labs ordered

## 2014-06-16 ENCOUNTER — Encounter: Payer: Self-pay | Admitting: Family Medicine

## 2014-06-20 ENCOUNTER — Other Ambulatory Visit (INDEPENDENT_AMBULATORY_CARE_PROVIDER_SITE_OTHER): Payer: BLUE CROSS/BLUE SHIELD

## 2014-06-20 DIAGNOSIS — E559 Vitamin D deficiency, unspecified: Secondary | ICD-10-CM

## 2014-06-20 DIAGNOSIS — M2550 Pain in unspecified joint: Secondary | ICD-10-CM

## 2014-06-20 DIAGNOSIS — E785 Hyperlipidemia, unspecified: Secondary | ICD-10-CM | POA: Diagnosis not present

## 2014-06-20 LAB — POCT CBC
Granulocyte percent: 71.2 %G (ref 37–80)
HEMATOCRIT: 54.6 % — AB (ref 43.5–53.7)
HEMOGLOBIN: 16.9 g/dL (ref 14.1–18.1)
Lymph, poc: 1.5 (ref 0.6–3.4)
MCH: 27 pg (ref 27–31.2)
MCHC: 30.9 g/dL — AB (ref 31.8–35.4)
MCV: 87.3 fL (ref 80–97)
MPV: 8.3 fL (ref 0–99.8)
POC Granulocyte: 5.1 (ref 2–6.9)
POC LYMPH PERCENT: 20.3 %L (ref 10–50)
Platelet Count, POC: 312 10*3/uL (ref 142–424)
RBC: 6.25 M/uL — AB (ref 4.69–6.13)
RDW, POC: 13 %
WBC: 7.2 10*3/uL (ref 4.6–10.2)

## 2014-06-20 NOTE — Progress Notes (Signed)
Lab only 

## 2014-06-21 LAB — NMR, LIPOPROFILE
CHOLESTEROL: 151 mg/dL (ref 100–199)
HDL CHOLESTEROL BY NMR: 30 mg/dL — AB (ref >39)
HDL Particle Number: 30.4 umol/L — ABNORMAL LOW (ref >=30.5)
LDL PARTICLE NUMBER: 1330 nmol/L — AB (ref <1000)
LDL Size: 20.1 nm (ref >20.5)
LDL-C: 78 mg/dL (ref 0–99)
LP-IR Score: 76 — ABNORMAL HIGH (ref <=45)
Small LDL Particle Number: 948 nmol/L — ABNORMAL HIGH (ref <=527)
Triglycerides by NMR: 216 mg/dL — ABNORMAL HIGH (ref 0–149)

## 2014-06-21 LAB — BMP8+EGFR
BUN / CREAT RATIO: 18 (ref 9–20)
BUN: 20 mg/dL (ref 6–24)
CO2: 22 mmol/L (ref 18–29)
Calcium: 9.7 mg/dL (ref 8.7–10.2)
Chloride: 103 mmol/L (ref 97–108)
Creatinine, Ser: 1.1 mg/dL (ref 0.76–1.27)
GFR calc non Af Amer: 80 mL/min/{1.73_m2} (ref >59)
GFR, EST AFRICAN AMERICAN: 92 mL/min/{1.73_m2} (ref >59)
Glucose: 98 mg/dL (ref 65–99)
POTASSIUM: 4.6 mmol/L (ref 3.5–5.2)
Sodium: 142 mmol/L (ref 134–144)

## 2014-06-21 LAB — VITAMIN D 25 HYDROXY (VIT D DEFICIENCY, FRACTURES): Vit D, 25-Hydroxy: 31.6 ng/mL (ref 30.0–100.0)

## 2014-06-21 LAB — URIC ACID: Uric Acid: 7.7 mg/dL (ref 3.7–8.6)

## 2014-06-21 LAB — HEPATIC FUNCTION PANEL
ALBUMIN: 4.8 g/dL (ref 3.5–5.5)
ALT: 32 IU/L (ref 0–44)
AST: 18 IU/L (ref 0–40)
Alkaline Phosphatase: 58 IU/L (ref 39–117)
BILIRUBIN, DIRECT: 0.11 mg/dL (ref 0.00–0.40)
Bilirubin Total: 0.3 mg/dL (ref 0.0–1.2)
Total Protein: 7.1 g/dL (ref 6.0–8.5)

## 2014-06-29 ENCOUNTER — Encounter: Payer: Self-pay | Admitting: Family Medicine

## 2014-06-29 ENCOUNTER — Other Ambulatory Visit: Payer: Self-pay | Admitting: *Deleted

## 2014-06-29 MED ORDER — FLUTICASONE PROPIONATE 50 MCG/ACT NA SUSP: 2.0000 | Freq: Every day | NASAL | Status: DC

## 2014-08-15 ENCOUNTER — Encounter: Payer: Self-pay | Admitting: Family Medicine

## 2014-08-15 ENCOUNTER — Ambulatory Visit (INDEPENDENT_AMBULATORY_CARE_PROVIDER_SITE_OTHER): Payer: BLUE CROSS/BLUE SHIELD | Admitting: Family Medicine

## 2014-08-15 ENCOUNTER — Ambulatory Visit (INDEPENDENT_AMBULATORY_CARE_PROVIDER_SITE_OTHER): Payer: BLUE CROSS/BLUE SHIELD

## 2014-08-15 VITALS — BP 137/93 | HR 104 | Temp 97.5°F | Ht 71.0 in | Wt 240.0 lb

## 2014-08-15 DIAGNOSIS — R350 Frequency of micturition: Secondary | ICD-10-CM | POA: Diagnosis not present

## 2014-08-15 DIAGNOSIS — N400 Enlarged prostate without lower urinary tract symptoms: Secondary | ICD-10-CM

## 2014-08-15 DIAGNOSIS — J301 Allergic rhinitis due to pollen: Secondary | ICD-10-CM

## 2014-08-15 DIAGNOSIS — E785 Hyperlipidemia, unspecified: Secondary | ICD-10-CM | POA: Diagnosis not present

## 2014-08-15 DIAGNOSIS — IMO0001 Reserved for inherently not codable concepts without codable children: Secondary | ICD-10-CM

## 2014-08-15 DIAGNOSIS — R5383 Other fatigue: Secondary | ICD-10-CM

## 2014-08-15 DIAGNOSIS — E291 Testicular hypofunction: Secondary | ICD-10-CM

## 2014-08-15 DIAGNOSIS — J309 Allergic rhinitis, unspecified: Secondary | ICD-10-CM | POA: Insufficient documentation

## 2014-08-15 DIAGNOSIS — K219 Gastro-esophageal reflux disease without esophagitis: Secondary | ICD-10-CM

## 2014-08-15 DIAGNOSIS — R030 Elevated blood-pressure reading, without diagnosis of hypertension: Secondary | ICD-10-CM

## 2014-08-15 DIAGNOSIS — E559 Vitamin D deficiency, unspecified: Secondary | ICD-10-CM

## 2014-08-15 DIAGNOSIS — Z0000 Encounter for general adult medical examination without abnormal findings: Secondary | ICD-10-CM

## 2014-08-15 LAB — POCT URINALYSIS DIPSTICK
BILIRUBIN UA: NEGATIVE
GLUCOSE UA: NEGATIVE
KETONES UA: NEGATIVE
Leukocytes, UA: NEGATIVE
Nitrite, UA: NEGATIVE
PROTEIN UA: NEGATIVE
SPEC GRAV UA: 1.02
Urobilinogen, UA: NEGATIVE
pH, UA: 6

## 2014-08-15 LAB — POCT UA - MICROSCOPIC ONLY
BACTERIA, U MICROSCOPIC: NEGATIVE
CRYSTALS, UR, HPF, POC: NEGATIVE
Casts, Ur, LPF, POC: NEGATIVE
Epithelial cells, urine per micros: NEGATIVE
YEAST UA: NEGATIVE

## 2014-08-15 MED ORDER — FLUTICASONE PROPIONATE 50 MCG/ACT NA SUSP: 2.0000 | Freq: Every day | NASAL | Status: DC

## 2014-08-15 NOTE — Progress Notes (Signed)
Subjective:    Patient ID: Scott Rasmussen, male    DOB: 30-Apr-1965, 49 y.o.   MRN: [id number]  HPI Patient is here today for annual wellness exam and follow up of chronic medical problems which includes hyperlipidemia and testosterone def. He is taking medications regularly. 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 He does not get a lot of regular sleep. He denies chest pain shortness of breath or problems with his intestinal tract. He has had a couple episodes of slight incontinence. This is with voiding. He did see the cardiologist last year and she did not think a stress test was necessary even though his father died at 22 years of age of a heart attack. The most recent blood work did show an elevated cholesterol and triglycerides. We will arrange for him to see the clinical pharmacist for diet education weight loss and lowering his risk factor for heart disease of his elevated cholesterol. We will also arrange for him to have a stress test in this office later this summer. He indicates that he has no erectile dysfunction. He does complain of decreased energy. The decreased energy and insomnia issues have been worse over the past couple of months. The urine frequency has been worse for one month.        Patient Active Problem List   Diagnosis Date Noted  . Allergic rhinitis 08/15/2014  . Abnormal liver function test 11/08/2013  . Obesity (BMI 30.0-34.9) 01/03/2013  . Gout 11/26/2012  . Hyperlipidemia 11/26/2012  . GERD (gastroesophageal reflux disease) 11/26/2012   Outpatient Encounter Prescriptions as of 08/15/2014  Medication Sig  . eszopiclone (LUNESTA) 2 MG TABS Take immediately before bedtime prn insomnia  . fenofibrate (TRICOR) 145 MG tablet Take 1 tablet (145 mg total) by mouth daily.  . fluticasone (FLONASE) 50 MCG/ACT nasal spray Place 2 sprays into both nostrils daily.  Marland Kitchen loratadine (CLARITIN) 10 MG tablet Take 1 tablet (10 mg total) by mouth daily.  Marland Kitchen  omega-3 acid ethyl esters (LOVAZA) 1 G capsule Take 2 capsules (2 g total) by mouth 2 (two) times daily.  Marland Kitchen omeprazole (PRILOSEC) 40 MG capsule Take 1 capsule (40 mg total) by mouth daily.  . phentermine 37.5 MG capsule Take 1 capsule (37.5 mg total) by mouth every morning. (Patient not taking: Reported on 08/15/2014)    Review of Systems  Constitutional: Positive for fatigue and unexpected weight change (gain).       Not sleeping well  HENT: Negative.   Eyes: Negative.   Respiratory: Negative.   Cardiovascular: Negative.   Gastrointestinal: Negative.   Endocrine: Negative.   Genitourinary: Positive for frequency.  Musculoskeletal: Negative.   Skin: Negative.   Allergic/Immunologic: Negative.   Neurological: Negative.   Hematological: Negative.   Psychiatric/Behavioral: Negative.        Objective:   Physical Exam  Constitutional: He is oriented to person, place, and time. He appears well-developed and well-nourished. No distress.  HENT:  Head: Normocephalic and atraumatic.  Right Ear: External ear normal.  Left Ear: External ear normal.  Mouth/Throat: Oropharynx is clear and moist. No oropharyngeal exudate.  Nasal congestion and turbinate swelling bilaterally left greater than right, the patient has had a previous uvuloplasty because of sleep apnea.  Eyes: Conjunctivae and EOM are normal. Pupils are equal, round, and reactive to light. Right eye exhibits no discharge. Left eye exhibits no discharge. No scleral icterus.  Neck: Normal range of motion. Neck supple. No thyromegaly present.  No thyromegaly carotid bruits  or anterior cervical adenopathy  Cardiovascular: Normal rate, regular rhythm, normal heart sounds and intact distal pulses.  Exam reveals no gallop and no friction rub.   No murmur heard. At 96/m  Pulmonary/Chest: Effort normal and breath sounds normal. No respiratory distress. He has no wheezes. He has no rales. He exhibits no tenderness.  Clear anteriorly and  posteriorly  Abdominal: Soft. Bowel sounds are normal. He exhibits no mass. There is no tenderness. There is no rebound and no guarding.  Abdominal obesity without masses tenderness or organ enlargement. There were no abdominal bruits. There was no inguinal adenopathy.  Genitourinary: Rectum normal and penis normal.  The prostate gland is slightly enlarged but smooth without lumps. There were no rectal masses. There were no inguinal hernias or inguinal adenopathy. External genitalia were within normal limits.  Musculoskeletal: Normal range of motion. He exhibits no edema or tenderness.  Lymphadenopathy:    He has no cervical adenopathy.  Neurological: He is alert and oriented to person, place, and time. He has normal reflexes. No cranial nerve deficit.  Skin: Skin is warm and dry. No rash noted. No erythema. No pallor.  Psychiatric: He has a normal mood and affect. His behavior is normal. Judgment and thought content normal.  Nursing note and vitals reviewed.  BP 137/93 mmHg  Pulse 104  Temp(Src) 97.5 F (36.4 C) (Oral)  Ht  (1.803 m)  Wt 240 lb (108.863 kg)  BMI 33.49 kg/m2  EKG: Normal EKG  [facility] reading (PRIMARY) by  Dr. Tracie Harrier x-ray--  borderline cardiac enlargement                                       Assessment & Plan:  1. Hyperlipidemia -The patient has a history of elevated cholesterol and must make every effort possible to follow a more strict diet and get a more regular exercise plan in place and lose weight. - DG Chest 2 View; Future - Lipid panel  2. Vitamin D deficiency -Increase vitamin D3, 2 2000 daily Monday through Friday and 4000 on Saturday and Sunday  3. Gastroesophageal reflux disease, esophagitis presence not specified -Continue omeprazole 40 mg 1 daily  4. Hypogonadism male -The patient is currently having no problems with erectile dysfunction but he has had a low testosterone level in the past and we will recheck this today. -  Testosterone,Free and Total - PSA, total and free  5. Annual physical exam -He will return an FOBT - POCT UA - Microscopic Only - POCT urinalysis dipstick - PSA, total and free - DG Chest 2 View; Future - EKG 12-Lead  6. Frequent urination -We will check a urine and make sure there is no sign of any infection - POCT UA - Microscopic Only - POCT urinalysis dipstick - PSA, total and free  7. Other fatigue - Thyroid Panel With TSH - Testosterone,Free and Total - EKG 12-Lead  8. Pressure blood increased -The patient will bring some outside blood pressures in for review and will bring these in with his visit to the clinical pharmacists for weight loss education - EKG 12-Lead  9. BPH (benign prostatic hyperplasia) -The patient is having frequency and we will check a urine today and treat him appropriately  10. Allergic rhinitis due to pollen -He will continue with his Flonase for his allergy.  Meds ordered this encounter  Medications  . fluticasone (FLONASE) 50 MCG/ACT nasal spray  Sig: Place 2 sprays into both nostrils daily.    Dispense:  48 g    Refill:  0   Patient Instructions  Continue current medications. Continue good therapeutic lifestyle changes which include good diet and exercise. Fall precautions discussed with patient. If an FOBT was given today- please return it to our front desk.   Flu Shots are still available at our office. If you still haven't had one please call to set up a nurse visit to get one.   After your visit with us today you will receive a survey in the mail or online from [employer] regarding your care with us. Please take a moment to fill this out. Your feedback is very important to us as you can help us better understand your patient needs as well as improve your experience and satisfaction. WE CARE ABOUT YOU!!!   Please bring blood pressures by for review and 3-4 weeks Please watch sodium intake We will arrange for you to have a visit  with the clinical pharmacist to discuss diet options and recheck blood pressure We will also arrange for you to have a stress test late this summer with one of our new MDs here in the office We will call you with the additional lab work results as soon as they become available Try to stop drinking all soda drinks and drink only water or flavored water The patient should try some melatonin 3 mg over-the-counter one nightly for sleep   Nyra Capeson W. Moore MD

## 2014-08-15 NOTE — Patient Instructions (Addendum)
Continue current medications. Continue good therapeutic lifestyle changes which include good diet and exercise. Fall precautions discussed with patient. If an FOBT was given today- please return it to our front desk.   Flu Shots are still available at our office. If you still haven't had one please call to set up a nurse visit to get one.   After your visit with us today you will receive a survey in the mail or online from [employer] regarding your care with us. Please take a moment to fill this out. Your feedback is very important to us as you can help us better understand your patient needs as well as improve your experience and satisfaction. WE CARE ABOUT YOU!!!   Please bring blood pressures by for review and 3-4 weeks Please watch sodium intake We will arrange for you to have a visit with the clinical pharmacist to discuss diet options and recheck blood pressure We will also arrange for you to have a stress test late this summer with one of our new MDs here in the office We will call you with the additional lab work results as soon as they become available Try to stop drinking all soda drinks and drink only water or flavored water The patient should try some melatonin 3 mg over-the-counter one nightly for sleep

## 2014-08-16 LAB — PSA, TOTAL AND FREE
PSA, Free Pct: 30 %
PSA, Free: 0.06 ng/mL
Prostate Specific Ag, Serum: 0.2 ng/mL (ref 0.0–4.0)

## 2014-08-16 LAB — LIPID PANEL
CHOL/HDL RATIO: 5.5 ratio — AB (ref 0.0–5.0)
Cholesterol, Total: 149 mg/dL (ref 100–199)
HDL: 27 mg/dL — ABNORMAL LOW (ref >39)
Triglycerides: 456 mg/dL — ABNORMAL HIGH (ref 0–149)

## 2014-08-16 LAB — THYROID PANEL WITH TSH
Free Thyroxine Index: 1.5 (ref 1.2–4.9)
T3 Uptake Ratio: 27 % (ref 24–39)
T4 TOTAL: 5.7 ug/dL (ref 4.5–12.0)
TSH: 1.6 u[IU]/mL (ref 0.450–4.500)

## 2014-08-16 LAB — TESTOSTERONE,FREE AND TOTAL
Testosterone, Free: 7 pg/mL (ref 6.8–21.5)
Testosterone: 264 ng/dL — ABNORMAL LOW (ref 348–1197)

## 2014-08-17 ENCOUNTER — Encounter: Payer: Self-pay | Admitting: *Deleted

## 2014-08-17 ENCOUNTER — Encounter: Payer: Self-pay | Admitting: Family Medicine

## 2014-08-18 ENCOUNTER — Telehealth: Payer: Self-pay | Admitting: *Deleted

## 2014-08-18 NOTE — Telephone Encounter (Signed)
-----   Message from Ernestina Pennaonald W Moore, MD sent at 08/18/2014  2:15 PM EDT ----- All thyroid function tests are within normal limits The total testosterone level is low and this is consistent with past readings. The free direct testosterone level is at the low end of the normal range. Please add an FSH, LH and prolactin level and once this is back we will need to schedule the patient to review his lab work and discuss the possibility of starting testosterone treatment with either the patch or injection The PSA remains low and within normal limits. The triglycerides are elevated at 456. The LDL cholesterol could not be calculated because of the elevated triglycerides. Please arrange for the patient to have an appointment with the clinical pharmacist to discuss better triglyceride management and we will need to initiate treatment also. If I and is seeing him first I can initiate the treatment with a follow-up with her. Also, please discuss with the patient that I spoke with the cardiologist and he would like to do a stress test on him sooner than in the summer. Please call Dr. Sherlie BanMcClain and send him a copy of this note and arrange for this exercise treadmill test to be done because of the patient's family history of heart disease and increased risk factors

## 2014-08-18 NOTE — Telephone Encounter (Signed)
lm 4/29-jhb

## 2014-08-21 ENCOUNTER — Telehealth: Payer: Self-pay | Admitting: Family Medicine

## 2014-08-21 NOTE — Telephone Encounter (Signed)
pt called

## 2014-08-22 LAB — FSH/LH
FSH: 6.1 m[IU]/mL (ref 1.5–12.4)
LH: 3.3 m[IU]/mL (ref 1.7–8.6)

## 2014-08-22 LAB — PROLACTIN: Prolactin: 9.6 ng/mL (ref 4.0–15.2)

## 2014-08-22 LAB — SPECIMEN STATUS REPORT

## 2014-08-23 ENCOUNTER — Encounter: Payer: Self-pay | Admitting: *Deleted

## 2014-08-23 NOTE — Progress Notes (Signed)
Dr Shirlee LatchMcLean, this is the pt that Dr Christell ConstantMoore had spoke to you about. He needs an ETT and all of his labs are back and in EPIC, Thank You! - [name] Ranney

## 2014-08-23 NOTE — Progress Notes (Signed)
Scott Rasmussen, can we please arrange for ETT for this patient at request of Dr Christell ConstantMoore? If normal, back to PCP.

## 2014-08-23 NOTE — Progress Notes (Signed)
The ordering numbers for most non invasive cardiology testing changed Monday May 2 because changes to Epic with the addition of Cupid.   The new ordering number for treadmills is [id number]. You should be able to place the order using this new number. There are new ordering numbers for most non invasive cardiology testing as a result of the addition of Cupid to the Epic system. [employer]Carol Burton in our office should be able to provide you with a list of the new order numbers.

## 2014-08-30 ENCOUNTER — Encounter: Payer: Self-pay | Admitting: Family Medicine

## 2014-08-30 ENCOUNTER — Ambulatory Visit (INDEPENDENT_AMBULATORY_CARE_PROVIDER_SITE_OTHER): Payer: BLUE CROSS/BLUE SHIELD | Admitting: Family Medicine

## 2014-08-30 MED ORDER — TESTOSTERONE 20.25 MG/ACT (1.62%) TD GEL: 1.0000 "application " | Freq: Every day | TRANSDERMAL | Status: DC

## 2014-08-30 NOTE — Progress Notes (Signed)
We felt this would be ordered and read by Dr Shirlee LatchMcLean. Could you place the order per him. Dr Christell ConstantMoore is not the ordering Physician in this case. Dr Christell ConstantMoore and Dr Shirlee LatchMcLean had previously spoke about this. Thank You.

## 2014-08-30 NOTE — Progress Notes (Signed)
   Subjective:    Patient ID: Scott Rasmussen, male    DOB: 1966-03-16, 49 y.o.   MRN: [id number]  HPI  Erroneous encounter   Review of Systems     Objective:   Physical Exam        Assessment & Plan:

## 2014-09-01 ENCOUNTER — Other Ambulatory Visit: Payer: Self-pay | Admitting: *Deleted

## 2014-09-01 DIAGNOSIS — E785 Hyperlipidemia, unspecified: Secondary | ICD-10-CM

## 2014-09-01 DIAGNOSIS — Z8249 Family history of ischemic heart disease and other diseases of the circulatory system: Secondary | ICD-10-CM

## 2014-09-01 DIAGNOSIS — IMO0001 Reserved for inherently not codable concepts without codable children: Secondary | ICD-10-CM

## 2014-09-01 DIAGNOSIS — R030 Elevated blood-pressure reading, without diagnosis of hypertension: Secondary | ICD-10-CM

## 2014-09-01 NOTE — Progress Notes (Signed)
Scott Rasmussen, our Interior and spatial designerdirector, said Dr Christell ConstantMoore should be able to place orders for referrals for cardiology testing/appts in our office if he is in Epic.  I will place the order for this patient if you will give me the diagnosis for the treadmill. Thanks.

## 2014-09-01 NOTE — Progress Notes (Signed)
I have placed an order for the treadmill and [facility] (schedul[facility]) to contact pt.

## 2014-09-01 NOTE — Progress Notes (Signed)
Thank you , we will try and order it from now on  DX -  Fatigue Elevated BP Overweight hyperlipidemia Family hx heart disease

## 2014-09-01 NOTE — Progress Notes (Signed)
What is the diagnosis for the treadmill?

## 2014-09-12 ENCOUNTER — Ambulatory Visit (INDEPENDENT_AMBULATORY_CARE_PROVIDER_SITE_OTHER): Payer: BLUE CROSS/BLUE SHIELD | Admitting: Pharmacist Clinician (PhC)/ Clinical Pharmacy Specialist

## 2014-09-12 ENCOUNTER — Telehealth: Payer: Self-pay | Admitting: Family Medicine

## 2014-09-12 ENCOUNTER — Encounter: Payer: Self-pay | Admitting: Pharmacist Clinician (PhC)/ Clinical Pharmacy Specialist

## 2014-09-12 DIAGNOSIS — E785 Hyperlipidemia, unspecified: Secondary | ICD-10-CM

## 2014-09-12 NOTE — Telephone Encounter (Signed)
[facility]  i told pt to call there.

## 2014-09-12 NOTE — Telephone Encounter (Signed)
Scott MuirJamie do you know anything about this

## 2014-09-12 NOTE — Progress Notes (Signed)
   Subjective:    Patient ID: Scott Rasmussen, male    DOB: 11-Aug-1965, 49 y.o.   MRN: [id number]  HPI    Review of Systems  Constitutional: Positive for appetite change.  Endocrine: Negative.   Allergic/Immunologic: Negative.   Neurological: Negative.   Hematological: Negative.   Psychiatric/Behavioral: Negative.        Objective:   Physical Exam  Constitutional: He is oriented to person, place, and time. He appears well-developed and well-nourished.  Cardiovascular: Normal rate and regular rhythm.   Neurological: He is alert and oriented to person, place, and time.  Skin: Skin is warm and dry.  Psychiatric: He has a normal mood and affect. His behavior is normal. Judgment and thought content normal.          Assessment & Plan:   Lipid Clinic Consultation  Chief Complaint:  No chief complaint on file.    Exam Regularity: RRR Edema:  neg Respirations:  18   Carotid Bruits:  neg Xanthomas:  neg General Appearance:  alert, oriented, no acute distress Mood/Affect:  normal  HPI:  Patient has had an issue with obesity      Component Value Date/Time   CHOL 149 08/15/2014 1523   CHOL 151 06/20/2014 0821   TRIG 456* 08/15/2014 1523   TRIG 216* 06/20/2014 0821   HDL 27* 08/15/2014 1523   HDL 30* 06/20/2014 0821   CHOLHDL 5.5* 08/15/2014 1523    Assessment: CHD/CHF Risk Equivalents:  strong family history of premature CAD, dad died age 47 of an MI [place] 7415yrs risk:  N/A NCEP Risk Factors Present:  N/A Primary Problem(s):  TG elevated  Patient has been set up for a stress test, will wait on Qysmia until this is completed and he has been ok'd to take this medication by cardiologist. Weight loss goal is 210 lbs.  Made a personalized meal plan for patient to follow and increase exercise to 5 days a week.  Current NCEP Goals: LDL Goal < 130- would aim for less than 100 with family history HDL Goal >/= 40 Tg Goal < 409150 Non-HDL Goal < 160  Secondary  cause of hyperlipidemia present:  None- was not fasting with repeat TG check Low fat diet followed?  No - heavy on red meat and eating out Low carb diet followed?  No - lots of carbs Exercise?  Yes - 3 times a week at gym.  Does combo walk/run for 2 miles.  Light weights  Recommendations: Changes in lipid medication(s):  Continue Tricor for now Recheck Lipid Panel:  12 weeks Other labs needed:  A1C  Time spent counseling patient:  45 min  Physician time spent with patient:    Referring Provider:  Christell ConstantMoore   PharmD:  [facility] Pharmacist

## 2014-09-14 ENCOUNTER — Telehealth: Payer: Self-pay | Admitting: Internal Medicine

## 2014-09-14 DIAGNOSIS — E669 Obesity, unspecified: Secondary | ICD-10-CM

## 2014-09-14 DIAGNOSIS — I10 Essential (primary) hypertension: Secondary | ICD-10-CM

## 2014-09-14 DIAGNOSIS — Z8249 Family history of ischemic heart disease and other diseases of the circulatory system: Secondary | ICD-10-CM

## 2014-09-14 DIAGNOSIS — E785 Hyperlipidemia, unspecified: Secondary | ICD-10-CM

## 2014-09-14 DIAGNOSIS — R5383 Other fatigue: Secondary | ICD-10-CM

## 2014-09-14 NOTE — Telephone Encounter (Signed)
New Message   Patient would like the nurse to give him a call back in regards to a stress test per [facility].

## 2014-09-14 NOTE — Telephone Encounter (Signed)
Patient notified that Dr. Shirlee LatchMcLean provided order for ETT and [facility] will call him to schedule procedure. Also will route to Berton LanJamie Bullins, LPN, at Dr. Kathi DerMoore's office so they are aware that procedure has been ordered and entered.

## 2014-10-03 ENCOUNTER — Encounter: Payer: Self-pay | Admitting: Family Medicine

## 2014-10-03 ENCOUNTER — Ambulatory Visit (INDEPENDENT_AMBULATORY_CARE_PROVIDER_SITE_OTHER): Payer: BLUE CROSS/BLUE SHIELD | Admitting: Family Medicine

## 2014-10-03 ENCOUNTER — Ambulatory Visit: Payer: BLUE CROSS/BLUE SHIELD | Admitting: Family Medicine

## 2014-10-03 VITALS — BP 122/88 | HR 107 | Temp 97.5°F | Ht 71.0 in | Wt 230.0 lb

## 2014-10-03 DIAGNOSIS — B079 Viral wart, unspecified: Secondary | ICD-10-CM

## 2014-10-03 DIAGNOSIS — R0602 Shortness of breath: Secondary | ICD-10-CM

## 2014-10-03 DIAGNOSIS — J209 Acute bronchitis, unspecified: Secondary | ICD-10-CM | POA: Diagnosis not present

## 2014-10-03 DIAGNOSIS — R05 Cough: Secondary | ICD-10-CM

## 2014-10-03 DIAGNOSIS — R059 Cough, unspecified: Secondary | ICD-10-CM

## 2014-10-03 MED ORDER — AZITHROMYCIN 250 MG PO TABS: ORAL_TABLET | ORAL | Status: DC

## 2014-10-03 NOTE — Patient Instructions (Addendum)
Take flonase at bedtime Take antibiotic as directed Nasal saline as needed Get stress test this Friday as planned When returned from vacation these call us back and discuss further management of fatigue and weight loss and the use of testosterone at that time

## 2014-10-03 NOTE — Progress Notes (Signed)
Subjective:    Patient ID: Scott Rasmussen, male    DOB: 1965-09-07, 49 y.o.   MRN: [id number]  HPI Patient here today for cough and congestion that started about 1 week ago. He also has a skin lesion on his left elbow that he is worried about. The patient has also has some increasing episodes of shortness of breath over the past week. Some of the sputum he is been coughing up has been yellow tinged. He had a normal chest x-ray in April. He is scheduled for a stress test this Friday. He has a week of vacation after that.      Patient Active Problem List   Diagnosis Date Noted  . Allergic rhinitis 08/15/2014  . Abnormal liver function test 11/08/2013  . Obesity (BMI 30.0-34.9) 01/03/2013  . Gout 11/26/2012  . Hyperlipidemia 11/26/2012  . GERD (gastroesophageal reflux disease) 11/26/2012   Outpatient Encounter Prescriptions as of 10/03/2014  Medication Sig  . eszopiclone (LUNESTA) 2 MG TABS Take immediately before bedtime prn insomnia  . fenofibrate (TRICOR) 145 MG tablet Take 1 tablet (145 mg total) by mouth daily.  . fluticasone (FLONASE) 50 MCG/ACT nasal spray Place 2 sprays into both nostrils daily.  Marland Kitchen loratadine (CLARITIN) 10 MG tablet Take 1 tablet (10 mg total) by mouth daily.  Marland Kitchen omega-3 acid ethyl esters (LOVAZA) 1 G capsule Take 2 capsules (2 g total) by mouth 2 (two) times daily.  Marland Kitchen omeprazole (PRILOSEC) 40 MG capsule Take 1 capsule (40 mg total) by mouth daily.  . phentermine 37.5 MG capsule Take 1 capsule (37.5 mg total) by mouth every morning.  . Testosterone (ANDROGEL PUMP) 20.25 MG/ACT (1.62%) GEL Place 1 application onto the skin daily.   No facility-administered encounter medications on file as of 10/03/2014.      Review of Systems  Constitutional: Negative.   HENT: Positive for congestion.   Eyes: Negative.   Respiratory: Positive for cough.   Cardiovascular: Negative.   Gastrointestinal: Negative.   Endocrine: Negative.   Genitourinary: Negative.     Musculoskeletal: Negative.   Skin: Negative.        Skin lesion - left elbow  Allergic/Immunologic: Negative.   Neurological: Negative.   Hematological: Negative.   Psychiatric/Behavioral: Negative.        Objective:   Physical Exam  Constitutional: He is oriented to person, place, and time. He appears well-developed and well-nourished. No distress.  HENT:  Head: Normocephalic and atraumatic.  Right Ear: External ear normal.  Left Ear: External ear normal.  Mouth/Throat: Oropharynx is clear and moist. No oropharyngeal exudate.  Is nasal congestion bilaterally.  Eyes: Conjunctivae and EOM are normal. Pupils are equal, round, and reactive to light. Right eye exhibits no discharge. Left eye exhibits no discharge.  Neck: Normal range of motion. Neck supple. No thyromegaly present.  Cardiovascular: Normal rate, regular rhythm and normal heart sounds.   No murmur heard. Pulmonary/Chest: Effort normal and breath sounds normal. No respiratory distress. He has no wheezes. He has no rales.  Patient has a dry cough.  Musculoskeletal: Normal range of motion.  Neurological: He is oriented to person, place, and time.  Skin: Skin is warm and dry. No rash noted.  Psychiatric: He has a normal mood and affect. His behavior is normal. Judgment and thought content normal.  Vitals reviewed.  BP 122/88 mmHg  Pulse 107  Temp(Src) 97.5 F (36.4 C) (Oral)  Ht  (1.803 m)  Wt 230 lb (104.327 kg)  BMI 32.09 kg/m2  Assessment & Plan:  1. Acute bronchitis, unspecified organism -Take antibiotic as directed, drink plenty of fluids and use Mucinex for cough and congestion twice daily with a large glass of water  2. Wart -Cryotherapy to wart was accomplished without problems  3. Shortness of breath Other than the cough and congestion no other reasons for the shortness of breath were noted.  4. Cough -Patient should take plain Mucinex blue and white in color one twice daily for cough  and congestion  Meds ordered this encounter  Medications  . azithromycin (ZITHROMAX) 250 MG tablet    Sig: As directed    Dispense:  6 tablet    Refill:  0   Patient Instructions  Take flonase at bedtime Take antibiotic as directed Nasal saline as needed Get stress test this Friday as planned When returned from vacation these call us back and discuss further management of fatigue and weight loss and the use of testosterone at that time   Nyra Capes MD

## 2014-10-04 ENCOUNTER — Telehealth (HOSPITAL_COMMUNITY): Payer: Self-pay

## 2014-10-04 NOTE — Telephone Encounter (Signed)
Encounter complete. 

## 2014-10-05 ENCOUNTER — Telehealth (HOSPITAL_COMMUNITY): Payer: Self-pay

## 2014-10-05 NOTE — Telephone Encounter (Signed)
Encounter complete. 

## 2014-10-06 ENCOUNTER — Ambulatory Visit (HOSPITAL_COMMUNITY)
Admission: RE | Admit: 2014-10-06 | Discharge: 2014-10-06 | Disposition: A | Discharge status: Home / Self Care | Payer: BLUE CROSS/BLUE SHIELD | Source: Ambulatory Visit | Attending: Cardiology | Admitting: Cardiology

## 2014-10-06 DIAGNOSIS — I10 Essential (primary) hypertension: Secondary | ICD-10-CM | POA: Insufficient documentation

## 2014-10-06 DIAGNOSIS — E785 Hyperlipidemia, unspecified: Secondary | ICD-10-CM | POA: Insufficient documentation

## 2014-10-06 DIAGNOSIS — R5383 Other fatigue: Secondary | ICD-10-CM | POA: Diagnosis not present

## 2014-10-06 DIAGNOSIS — E669 Obesity, unspecified: Secondary | ICD-10-CM | POA: Insufficient documentation

## 2014-10-06 DIAGNOSIS — Z8249 Family history of ischemic heart disease and other diseases of the circulatory system: Secondary | ICD-10-CM | POA: Insufficient documentation

## 2014-10-06 LAB — EXERCISE TOLERANCE TEST
CHL CUP MPHR: 173 {beats}/min
CSEPEW: 15.3 METS
CSEPHR: 100 %
CSEPPHR: 173 {beats}/min
Exercise duration (min): 13 min
Exercise duration (sec): 0 s
RPE: 15
Rest HR: 85 {beats}/min

## 2014-10-30 ENCOUNTER — Ambulatory Visit (INDEPENDENT_AMBULATORY_CARE_PROVIDER_SITE_OTHER): Payer: BLUE CROSS/BLUE SHIELD | Admitting: Pharmacist

## 2014-10-30 ENCOUNTER — Telehealth: Payer: Self-pay | Admitting: Pharmacist

## 2014-10-30 ENCOUNTER — Encounter: Payer: Self-pay | Admitting: Pharmacist

## 2014-10-30 VITALS — BP 110/80 | HR 95 | Ht 71.0 in | Wt 239.0 lb

## 2014-10-30 DIAGNOSIS — E782 Mixed hyperlipidemia: Secondary | ICD-10-CM | POA: Diagnosis not present

## 2014-10-30 DIAGNOSIS — E669 Obesity, unspecified: Secondary | ICD-10-CM | POA: Diagnosis not present

## 2014-10-30 MED ORDER — PHENTERMINE-TOPIRAMATE ER 3.75-23 MG PO CP24: 1.0000 | ORAL_CAPSULE | ORAL | Status: DC

## 2014-10-30 MED ORDER — PHENTERMINE HCL 37.5 MG PO TABS: 18.7500 mg | ORAL_TABLET | Freq: Every day | ORAL | Status: DC

## 2014-10-30 NOTE — Progress Notes (Signed)
Patient ID: Scott Rasmussen, male   DOB: Mar 28, 1966, 49 y.o.   MRN: [id number]    Subjective:     HPI: Scott Rasmussen is a 49 y.o. male here for follow up of dyslipidemia / elevated trigllcerides and obesity. The patient does not use medications that may worsen dyslipidemias (corticosteroids, progestins, anabolic steroids, diuretics, beta-blockers, amiodarone, cyclosporine, olanzapine). The patient exercises none over the last 3 weeks. The patient is not known to have coexisting coronary artery disease.   Patient is also trying to lose weight.  He has tried phentermine with adequate results in past but he regained weight over the last 6 -12 months.   Goal weight per patient:  210 lbs  He biggest challenge is stopping eating when he is full.      Objective:    Filed Vitals:   10/30/14 1449  BP: 110/80  Pulse: 95   Filed Weights   10/30/14 1449  Weight: 239 lb (108.41 kg)    Body mass index is 33.35 kg/(m^2). Cardio stress test 10/06/2014 was WNL   Assessment:            Visit Diagnoses    Elevated triglycerides with high cholesterol    -  Primary    Relevant Orders    Lipid panel    LDL cholesterol, direct            Obestiry        Plan:    The following changes are planned for the next 3 months, at which time the patient will return for repeat fasting lipids:  1. Dietary recommendations: Mediterranean diet guidelines low calorie diet 2. Exercise recommendations:  An average 40 minutes of moderate to vigorous-intensity aerobic activity 3 or 4 times per week 3. Other treatment: rx for qysemia take 1 tablet qam- rx from Dr Christell ConstantMoore for #14 for trial   Scott Rasmussen, PharmD, CPP

## 2014-10-30 NOTE — Telephone Encounter (Signed)
Cost of Qsyemia was over $200. Patient to get free 14 days and try but we are planning to switch to phentermine.  Rx called in.

## 2014-11-02 ENCOUNTER — Telehealth: Payer: Self-pay

## 2014-11-02 NOTE — Telephone Encounter (Signed)
Insurance prior authorized Androgel Gel Pump 10/03/14 to 11/02/15

## 2014-11-07 ENCOUNTER — Encounter: Payer: Self-pay | Admitting: Physician Assistant

## 2014-11-07 ENCOUNTER — Ambulatory Visit (INDEPENDENT_AMBULATORY_CARE_PROVIDER_SITE_OTHER): Payer: BLUE CROSS/BLUE SHIELD | Admitting: Physician Assistant

## 2014-11-07 ENCOUNTER — Ambulatory Visit: Payer: BLUE CROSS/BLUE SHIELD | Admitting: Physician Assistant

## 2014-11-07 DIAGNOSIS — M10071 Idiopathic gout, right ankle and foot: Secondary | ICD-10-CM

## 2014-11-07 DIAGNOSIS — E785 Hyperlipidemia, unspecified: Secondary | ICD-10-CM | POA: Diagnosis not present

## 2014-11-07 DIAGNOSIS — L918 Other hypertrophic disorders of the skin: Secondary | ICD-10-CM

## 2014-11-07 DIAGNOSIS — B356 Tinea cruris: Secondary | ICD-10-CM

## 2014-11-07 MED ORDER — FENOFIBRATE 145 MG PO TABS: 145.0000 mg | ORAL_TABLET | Freq: Every day | ORAL | Status: DC

## 2014-11-07 MED ORDER — OMEGA-3-ACID ETHYL ESTERS 1 G PO CAPS: 2.0000 g | ORAL_CAPSULE | Freq: Two times a day (BID) | ORAL | Status: DC

## 2014-11-07 MED ORDER — OMEPRAZOLE 40 MG PO CPDR: 40.0000 mg | DELAYED_RELEASE_CAPSULE | Freq: Every day | ORAL | Status: DC

## 2014-11-07 NOTE — Progress Notes (Signed)
Subjective:     Patient ID: Scott Rasmussen, male   DOB: Feb 13, 1966, [age] y.o.   MRN: [id number]  HPI Pt here with 3 main complaints #1 He had a flare of his gout to the R great toe over the weekend Prev on Allopurinol but stopped this after talking to one of his specialist This is the first flare since stopping Sx much better at this time #2- He has had a pruritic rash to the groin bilat for several weeks No pain Started using OTC meds with some improvement #3- Mole to the buttocks with sl irritation/pain  Review of Systems     Objective:   Physical Exam R great toe w/o erythema, edema, or induration FROM of the toe No TTP #2- sl erythema to the inguinal area bilat Scrotum not involved No skin breakdown noted #3- skin tags noted to both the L and R buttock at the cleft No edema, bleeding, or induration noted    Assessment:     Gout Tinea cruris Skin tags    Plan:     Discussed the reasoning of preventative therapy in gout He will research and discuss with his regular provider #2- reviewed nl course OTC antifungals Keep area clean and dry #3- Offered possible removal Instructed this could be done through the office F/U prn

## 2014-11-07 NOTE — Patient Instructions (Signed)

## 2014-11-13 ENCOUNTER — Telehealth: Payer: Self-pay | Admitting: Pharmacist

## 2014-11-13 NOTE — Telephone Encounter (Signed)
Patient took Qysemia for 14 days - low dose did not have any effect.  However, since qysemia was not covered by his insurance and would cost over $200, phentermine has already been called to [facility] 10/30/14.  It was profiled.  Verified with RPH at [facility].  Patient notified.

## 2014-12-12 ENCOUNTER — Telehealth: Payer: Self-pay | Admitting: Pharmacist

## 2014-12-12 ENCOUNTER — Other Ambulatory Visit: Payer: Self-pay

## 2014-12-12 NOTE — Telephone Encounter (Signed)
Last seen 11/07/14 [facility]  If approved print

## 2014-12-13 MED ORDER — PHENTERMINE HCL 37.5 MG PO TABS: 18.7500 mg | ORAL_TABLET | Freq: Every day | ORAL | Status: DC

## 2014-12-13 NOTE — Telephone Encounter (Signed)
i have never rx to patient ask tammy bout it or Berton Lan- looks like dr. Christell Constant filled last

## 2014-12-22 ENCOUNTER — Ambulatory Visit (INDEPENDENT_AMBULATORY_CARE_PROVIDER_SITE_OTHER): Payer: BLUE CROSS/BLUE SHIELD | Admitting: Family Medicine

## 2014-12-22 ENCOUNTER — Encounter: Payer: Self-pay | Admitting: Family Medicine

## 2014-12-22 VITALS — BP 127/82 | HR 96 | Temp 97.8°F | Ht 71.0 in | Wt 233.0 lb

## 2014-12-22 DIAGNOSIS — R5383 Other fatigue: Secondary | ICD-10-CM | POA: Diagnosis not present

## 2014-12-22 DIAGNOSIS — N400 Enlarged prostate without lower urinary tract symptoms: Secondary | ICD-10-CM

## 2014-12-22 DIAGNOSIS — L209 Atopic dermatitis, unspecified: Secondary | ICD-10-CM

## 2014-12-22 DIAGNOSIS — E785 Hyperlipidemia, unspecified: Secondary | ICD-10-CM

## 2014-12-22 DIAGNOSIS — K219 Gastro-esophageal reflux disease without esophagitis: Secondary | ICD-10-CM | POA: Diagnosis not present

## 2014-12-22 DIAGNOSIS — E291 Testicular hypofunction: Secondary | ICD-10-CM | POA: Diagnosis not present

## 2014-12-22 DIAGNOSIS — H6092 Unspecified otitis externa, left ear: Secondary | ICD-10-CM

## 2014-12-22 DIAGNOSIS — E559 Vitamin D deficiency, unspecified: Secondary | ICD-10-CM | POA: Diagnosis not present

## 2014-12-22 MED ORDER — FLUOCINONIDE-E 0.05 % EX CREA: 1.0000 "application " | TOPICAL_CREAM | Freq: Two times a day (BID) | CUTANEOUS | Status: DC

## 2014-12-22 MED ORDER — TESTOSTERONE 20.25 MG/ACT (1.62%) TD GEL: 1.0000 "application " | Freq: Every day | TRANSDERMAL | Status: DC

## 2014-12-22 MED ORDER — NEOMYCIN-POLYMYXIN-HC 3.5-10000-1 OT SOLN: OTIC | Status: DC

## 2014-12-22 NOTE — Patient Instructions (Addendum)
Continue current medications. Continue good therapeutic lifestyle changes which include good diet and exercise. Fall precautions discussed with patient. If an FOBT was given today- please return it to our front desk. If you are over 47 years old - you may need Prevnar 13 or the adult Pneumonia vaccine.  **Flu shots will be available soon--- please call and schedule a FLU-CLINIC appointment**  After your visit with [place] today you will receive a survey in the mail or online from [employer] regarding your care with [place]. Please take a moment to fill this out. Your feedback is very important to [place] as you can help [place] better understand your patient needs as well as improve your experience and satisfaction. WE CARE ABOUT YOU!!!   **Please join [place] SEPT.22, 2016 from 5:00 to 7:00pm for our OPEN HOUSE! Come out and meet our NEW providers**  The patient should continue to use scent free fabric softener soaps and detergents. He should use the AndroGel to see if this helps his fatigue. He should use the Lidex cream as directed sparingly twice daily for 7-10 days to the affected area of skin He should continue to use aggressive therapeutic lifestyle changes to get weight down and get cholesterol under control

## 2014-12-22 NOTE — Progress Notes (Signed)
Subjective:    Patient ID: Scott Rasmussen, male    DOB: Sep 09, 1965, 49 y.o.   MRN: [id number]  HPI Pt here for follow up and management of chronic medical problems which includes hyperlipidemia and GERD. He is taking medications regularly. The patient is taking his prescription medications regularly. He complains of some problems with his ear with drainage and discharge. He is also complaining with a fungal rash in the groin area. He will be given an FOBT to return. He will also return to the office for fasting lab work. He has stress test in June and this turned out well. He is requesting refills on his testosterone.       Patient Active Problem List   Diagnosis Date Noted  . Allergic rhinitis 08/15/2014  . Abnormal liver function test 11/08/2013  . Obesity (BMI 30.0-34.9) 01/03/2013  . Gout 11/26/2012  . Hyperlipidemia 11/26/2012  . GERD (gastroesophageal reflux disease) 11/26/2012   Outpatient Encounter Prescriptions as of 12/22/2014  Medication Sig  . fenofibrate (TRICOR) 145 MG tablet Take 1 tablet (145 mg total) by mouth daily.  . fluticasone (FLONASE) 50 MCG/ACT nasal spray Place 2 sprays into both nostrils daily.  Marland Kitchen loratadine (CLARITIN) 10 MG tablet Take 1 tablet (10 mg total) by mouth daily.  Marland Kitchen omega-3 acid ethyl esters (LOVAZA) 1 G capsule Take 2 capsules (2 g total) by mouth 2 (two) times daily.  Marland Kitchen omeprazole (PRILOSEC) 40 MG capsule Take 1 capsule (40 mg total) by mouth daily.  . phentermine (ADIPEX-P) 37.5 MG tablet Take 0.5-1 tablets (18.75-37.5 mg total) by mouth daily before breakfast.  . Testosterone (ANDROGEL PUMP) 20.25 MG/ACT (1.62%) GEL Place 1 application onto the skin daily. (Patient not taking: Reported on 12/22/2014)   No facility-administered encounter medications on file as of 12/22/2014.     Review of Systems  Constitutional: Negative.   HENT: Positive for ear discharge (feels moist and itches).   Eyes: Negative.   Respiratory: Negative.     Cardiovascular: Negative.   Gastrointestinal: Negative.   Endocrine: Negative.   Genitourinary: Negative.   Musculoskeletal: Negative.   Skin:       Fungal area - groin area  Allergic/Immunologic: Negative.   Neurological: Negative.   Hematological: Negative.   Psychiatric/Behavioral: Negative.        Objective:   Physical Exam  Constitutional: He is oriented to person, place, and time. He appears well-developed and well-nourished. No distress.  HENT:  Head: Normocephalic and atraumatic.  Right Ear: External ear normal.  Nose: Nose normal.  Mouth/Throat: Oropharynx is clear and moist. No oropharyngeal exudate.  The left ear canal looks pretty clear. He has a purple stain from the previous visit to the ear nose and throat specialist.  Eyes: Conjunctivae and EOM are normal. Pupils are equal, round, and reactive to light. Right eye exhibits no discharge. Left eye exhibits no discharge. No scleral icterus.  Neck: Normal range of motion. Neck supple. No thyromegaly present.  No thyromegaly or anterior cervical adenopathy. No carotid bruits  Cardiovascular: Normal rate, regular rhythm, normal heart sounds and intact distal pulses.  Exam reveals no gallop and no friction rub.   No murmur heard. The heart is regular without murmur at 84/m.  Pulmonary/Chest: Effort normal and breath sounds normal. No respiratory distress. He has no wheezes. He has no rales. He exhibits no tenderness.  Clear anteriorly and posteriorly  Abdominal: Soft. Bowel sounds are normal. He exhibits no mass. There is no tenderness. There is no rebound  and no guarding.  There is no abdominal tenderness masses or enlargement  Genitourinary: Penis normal.  The genitalia were inspected today because the patient was having itching mostly in the groin area and posterior to the scrotum. There is actually no rash to be seen there is no redness. He has recently shaved himself but he does not usually do this. He said it has  been itching for some while.  Musculoskeletal: Normal range of motion. He exhibits no edema or tenderness.  Lymphadenopathy:    He has no cervical adenopathy.  Neurological: He is alert and oriented to person, place, and time. He has normal reflexes. No cranial nerve deficit.  Skin: Skin is warm and dry. No rash noted. No erythema. No pallor.  The groin area appeared normal without any redness or excoriations. There is no drainage or rubor.  Psychiatric: He has a normal mood and affect. His behavior is normal. Judgment and thought content normal.  Nursing note and vitals reviewed.  BP 127/82 mmHg  Pulse 96  Temp(Src) 97.8 F (36.6 C) (Oral)  Ht 5\' 11"  (1.803 m)  Wt 233 lb (105.688 kg)  BMI 32.51 kg/m2        Assessment & Plan:  1. Hyperlipidemia -The patient should continue fenofibrate and omega-3 fatty acids as well as diet and exercise pending results of lab work  2. Vitamin D deficiency -He is currently not taking any vitamin D but this has been low in the past and we will determine if there is a need to continue to take this.  3. Gastroesophageal reflux disease, esophagitis presence not specified -He had no complaints with this today and he will continue with his omeprazole as doing  4. Hypogonadism male -He has had multiple tests indicating low testosterone levels. He has not started the AndroGel at this point in time but plans to start this now and will give [place] some feedback as to its effect on his energy level.  5. BPH (benign prostatic hyperplasia) -He has no problems with voiding.  6. Left otitis externa -He is going to do better with keeping the water out of the ear and use max earplugs. -He was given a refill prescription for the Cortisporin optic and will use this if his ears do not get better. -If he continues to have problems we will go back to the ear nose and throat specialist. - neomycin-polymyxin-hydrocortisone (CORTISPORIN) otic solution; Use 3-4 drops 4  times daily with a cotton wick to the left ear as directed x7-10 day  Dispense: 10 mL; Refill: 1  7. Atopic dermatitis -We'll try the Lidex E for the area in the groin that continues to itch despite the lack of a rash. -The patient will make every effort to use scent free fabric softener soaps and detergents and allow himself to get as much air down below as possible  8. Lethargy -He is starting the AndroGel topical to see if this helps the testosterone level which in turn will help his fatigue issues.  Meds ordered this encounter  Medications  . Testosterone (ANDROGEL PUMP) 20.25 MG/ACT (1.62%) GEL    Sig: Place 1 application onto the skin daily.    Dispense:  225 g    Refill:  3  . neomycin-polymyxin-hydrocortisone (CORTISPORIN) otic solution    Sig: Use 3-4 drops 4 times daily with a cotton wick to the left ear as directed x7-10 day    Dispense:  10 mL    Refill:  1  . fluocinonide-emollient (  LIDEX-E) 0.05 % cream    Sig: Apply 1 application topically 2 (two) times daily. Use sparingly for 7-10    Dispense:  30 g    Refill:  1   Patient Instructions  Continue current medications. Continue good therapeutic lifestyle changes which include good diet and exercise. Fall precautions discussed with patient. If an FOBT was given today- please return it to our front desk. If you are over 51 years old - you may need Prevnar 13 or the adult Pneumonia vaccine.  **Flu shots will be available soon--- please call and schedule a FLU-CLINIC appointment**  After your visit with [place] today you will receive a survey in the mail or online from [employer] regarding your care with [place]. Please take a moment to fill this out. Your feedback is very important to [place] as you can help [place] better understand your patient needs as well as improve your experience and satisfaction. WE CARE ABOUT YOU!!!   **Please join [place] [email or web address], 2016 from 5:00 to 7:00pm for our OPEN HOUSE! Come out and meet our NEW  providers**  The patient should continue to use scent free fabric softener soaps and detergents. He should use the AndroGel to see if this helps his fatigue. He should use the Lidex cream as directed sparingly twice daily for 7-10 days to the affected area of skin He should continue to use aggressive therapeutic lifestyle changes to get weight down and get cholesterol under control   Nyra Capes MD

## 2014-12-26 ENCOUNTER — Telehealth: Payer: Self-pay | Admitting: Family Medicine

## 2015-01-30 ENCOUNTER — Ambulatory Visit (INDEPENDENT_AMBULATORY_CARE_PROVIDER_SITE_OTHER): Payer: BLUE CROSS/BLUE SHIELD

## 2015-01-30 ENCOUNTER — Other Ambulatory Visit: Payer: BLUE CROSS/BLUE SHIELD

## 2015-01-30 DIAGNOSIS — K219 Gastro-esophageal reflux disease without esophagitis: Secondary | ICD-10-CM

## 2015-01-30 DIAGNOSIS — Z23 Encounter for immunization: Secondary | ICD-10-CM

## 2015-01-30 DIAGNOSIS — N400 Enlarged prostate without lower urinary tract symptoms: Secondary | ICD-10-CM

## 2015-01-30 DIAGNOSIS — E291 Testicular hypofunction: Secondary | ICD-10-CM

## 2015-01-30 DIAGNOSIS — E559 Vitamin D deficiency, unspecified: Secondary | ICD-10-CM

## 2015-01-30 DIAGNOSIS — E785 Hyperlipidemia, unspecified: Secondary | ICD-10-CM

## 2015-01-31 LAB — CBC WITH DIFFERENTIAL/PLATELET
BASOS ABS: 0.1 10*3/uL (ref 0.0–0.2)
Basos: 1 %
EOS (ABSOLUTE): 0.2 10*3/uL (ref 0.0–0.4)
EOS: 4 %
HEMOGLOBIN: 16.8 g/dL (ref 12.6–17.7)
Hematocrit: 49.6 % (ref 37.5–51.0)
IMMATURE GRANS (ABS): 0 10*3/uL (ref 0.0–0.1)
Immature Granulocytes: 0 %
LYMPHS: 30 %
Lymphocytes Absolute: 1.5 10*3/uL (ref 0.7–3.1)
MCH: 29.4 pg (ref 26.6–33.0)
MCHC: 33.9 g/dL (ref 31.5–35.7)
MCV: 87 fL (ref 79–97)
MONOCYTES: 10 %
Monocytes Absolute: 0.5 10*3/uL (ref 0.1–0.9)
NEUTROS ABS: 2.7 10*3/uL (ref 1.4–7.0)
Neutrophils: 55 %
Platelets: 305 10*3/uL (ref 150–379)
RBC: 5.72 x10E6/uL (ref 4.14–5.80)
RDW: 13.5 % (ref 12.3–15.4)
WBC: 5 10*3/uL (ref 3.4–10.8)

## 2015-01-31 LAB — HEPATIC FUNCTION PANEL
ALT: 42 IU/L (ref 0–44)
AST: 29 IU/L (ref 0–40)
Albumin: 4.6 g/dL (ref 3.5–5.5)
Alkaline Phosphatase: 51 IU/L (ref 39–117)
Bilirubin Total: 0.5 mg/dL (ref 0.0–1.2)
Bilirubin, Direct: 0.13 mg/dL (ref 0.00–0.40)
Total Protein: 6.9 g/dL (ref 6.0–8.5)

## 2015-01-31 LAB — BMP8+EGFR
BUN/Creatinine Ratio: 17 (ref 9–20)
BUN: 18 mg/dL (ref 6–24)
CALCIUM: 9.7 mg/dL (ref 8.7–10.2)
CHLORIDE: 103 mmol/L (ref 97–108)
CO2: 24 mmol/L (ref 18–29)
CREATININE: 1.06 mg/dL (ref 0.76–1.27)
GFR calc non Af Amer: 83 mL/min/{1.73_m2} (ref >59)
GFR, EST AFRICAN AMERICAN: 96 mL/min/{1.73_m2} (ref >59)
Glucose: 105 mg/dL — ABNORMAL HIGH (ref 65–99)
Potassium: 4.7 mmol/L (ref 3.5–5.2)
Sodium: 141 mmol/L (ref 134–144)

## 2015-01-31 LAB — NMR, LIPOPROFILE
CHOLESTEROL: 164 mg/dL (ref 100–199)
HDL Cholesterol by NMR: 34 mg/dL — ABNORMAL LOW (ref >39)
HDL PARTICLE NUMBER: 31.6 umol/L (ref >=30.5)
LDL Particle Number: 1496 nmol/L — ABNORMAL HIGH (ref <1000)
LDL SIZE: 20.5 nm (ref >20.5)
LDL-C: 98 mg/dL (ref 0–99)
LP-IR SCORE: 79 — AB (ref <=45)
SMALL LDL PARTICLE NUMBER: 1006 nmol/L — AB (ref <=527)
Triglycerides by NMR: 158 mg/dL — ABNORMAL HIGH (ref 0–149)

## 2015-01-31 LAB — PSA: Prostate Specific Ag, Serum: 0.2 ng/mL (ref 0.0–4.0)

## 2015-01-31 LAB — TESTOSTERONE,FREE AND TOTAL
TESTOSTERONE FREE: 8.8 pg/mL (ref 6.8–21.5)
TESTOSTERONE: 256 ng/dL — AB (ref 348–1197)

## 2015-01-31 LAB — VITAMIN D 25 HYDROXY (VIT D DEFICIENCY, FRACTURES): Vit D, 25-Hydroxy: 37.9 ng/mL (ref 30.0–100.0)

## 2015-02-01 ENCOUNTER — Other Ambulatory Visit: Payer: Self-pay | Admitting: *Deleted

## 2015-02-01 MED ORDER — ROSUVASTATIN CALCIUM 5 MG PO TABS: 5.0000 mg | ORAL_TABLET | Freq: Every day | ORAL | Status: DC

## 2015-02-02 ENCOUNTER — Telehealth: Payer: Self-pay | Admitting: Family Medicine

## 2015-02-02 MED ORDER — DOXYCYCLINE HYCLATE 100 MG PO TABS: 100.0000 mg | ORAL_TABLET | Freq: Two times a day (BID) | ORAL | Status: DC

## 2015-02-02 NOTE — Telephone Encounter (Signed)
Pt aware.

## 2015-02-02 NOTE — Telephone Encounter (Signed)
Doxycycline 100 mg twice daily #60 no refill----if the inflammation does not resolve or improve the patient should come in to be seen

## 2015-03-12 ENCOUNTER — Telehealth: Payer: Self-pay | Admitting: Pharmacist

## 2015-03-12 MED ORDER — PHENTERMINE HCL 37.5 MG PO TABS: 18.7500 mg | ORAL_TABLET | Freq: Every day | ORAL | Status: DC

## 2015-03-12 NOTE — Telephone Encounter (Signed)
rx called in for phentermine 37.5mg  #15 tablets - patient needed appt.  He states that he is due to see Dr Christell ConstantMoore n 2 weeks to have labs drawn.  Check weight/BP/HR then.   Appt 03/27/15

## 2015-03-27 ENCOUNTER — Ambulatory Visit (INDEPENDENT_AMBULATORY_CARE_PROVIDER_SITE_OTHER): Payer: BLUE CROSS/BLUE SHIELD | Admitting: Family Medicine

## 2015-03-27 ENCOUNTER — Encounter: Payer: Self-pay | Admitting: Family Medicine

## 2015-03-27 VITALS — BP 130/90 | HR 109 | Temp 97.7°F | Ht 71.0 in | Wt 233.0 lb

## 2015-03-27 DIAGNOSIS — F329 Major depressive disorder, single episode, unspecified: Secondary | ICD-10-CM | POA: Diagnosis not present

## 2015-03-27 DIAGNOSIS — E291 Testicular hypofunction: Secondary | ICD-10-CM

## 2015-03-27 DIAGNOSIS — E785 Hyperlipidemia, unspecified: Secondary | ICD-10-CM

## 2015-03-27 DIAGNOSIS — E669 Obesity, unspecified: Secondary | ICD-10-CM | POA: Diagnosis not present

## 2015-03-27 DIAGNOSIS — M10071 Idiopathic gout, right ankle and foot: Secondary | ICD-10-CM

## 2015-03-27 DIAGNOSIS — J069 Acute upper respiratory infection, unspecified: Secondary | ICD-10-CM

## 2015-03-27 DIAGNOSIS — F32A Depression, unspecified: Secondary | ICD-10-CM

## 2015-03-27 MED ORDER — FLUOXETINE HCL 10 MG PO CAPS: 10.0000 mg | ORAL_CAPSULE | Freq: Every day | ORAL | Status: DC

## 2015-03-27 MED ORDER — OMEGA-3-ACID ETHYL ESTERS 1 G PO CAPS: 2.0000 g | ORAL_CAPSULE | Freq: Two times a day (BID) | ORAL | Status: DC

## 2015-03-27 MED ORDER — AMOXICILLIN 500 MG PO CAPS: 500.0000 mg | ORAL_CAPSULE | Freq: Three times a day (TID) | ORAL | Status: DC

## 2015-03-27 NOTE — Progress Notes (Signed)
Subjective:    Patient ID: Scott Rasmussen, male    DOB: Feb 24, 1966, 49 y.o.   MRN: [id number]  HPI Patient here today for weight management, sinus issues and gout of right great toe. The patient has head congestion and sinus pressure and drainage. His previous blood pressure was 127/82. His weight remains the same as it was on the previous visit at 233 pounds. His blood pressure today is elevated. Recent lab work will be reviewed with the patient today and he is already been notified by phone. The blood sugar slightly elevated at 105 and the creatinine was good. CBC was normal with a good hemoglobin and all liver function tests were within normal limits. Cholesterol numbers with advanced lipid testing continued to have a total LDL particle number that is elevated more so than in the past. The LDL C is also higher. The patient has been started on Crestor 5 mg. The vitamin D level is within normal limits and we will make sure that he is taking vitamin D3, 1000, the PSA is normal. The patient recently had a take some time out of work because of a gout flare in his right knee. He has not had his uric acid checked in a while and we will do this today. He has problems with his toe at times also. He is also concerned that he may be depressed and he is a lot like his daughter and she has taken some Prozac recently and this helped her depression significantly. We did discuss it has side effects of sexual dysfunction. He is aware of this. He otherwise denies chest pain or shortness of breath. He is swallowing his food without problems and has no heartburn or indigestion currently and is passing his water also without problems. He says he performs well sexually and he is not sure how much the AndroGel is helping him as far as his energy level is concerned and this is why he wants to try the Prozac. The patient is no longer on FMLA leave that he had to request several weeks ago because of his gout  flare.     Patient Active Problem List   Diagnosis Date Noted  . Allergic rhinitis 08/15/2014  . Abnormal liver function test 11/08/2013  . Obesity (BMI 30.0-34.9) 01/03/2013  . Gout 11/26/2012  . Hyperlipidemia 11/26/2012  . GERD (gastroesophageal reflux disease) 11/26/2012   Outpatient Encounter Prescriptions as of 03/27/2015  Medication Sig  . fenofibrate (TRICOR) 145 MG tablet Take 1 tablet (145 mg total) by mouth daily.  . fluocinonide-emollient (LIDEX-E) 0.05 % cream Apply 1 application topically 2 (two) times daily. Use sparingly for 7-10  . fluticasone (FLONASE) 50 MCG/ACT nasal spray Place 2 sprays into both nostrils daily.  Marland Kitchen loratadine (CLARITIN) 10 MG tablet Take 1 tablet (10 mg total) by mouth daily.  Marland Kitchen omega-3 acid ethyl esters (LOVAZA) 1 G capsule Take 2 capsules (2 g total) by mouth 2 (two) times daily.  Marland Kitchen omeprazole (PRILOSEC) 40 MG capsule Take 1 capsule (40 mg total) by mouth daily.  . phentermine (ADIPEX-P) 37.5 MG tablet Take 0.5-1 tablets (18.75-37.5 mg total) by mouth daily before breakfast.  . Testosterone (ANDROGEL PUMP) 20.25 MG/ACT (1.62%) GEL Place 1 application onto the skin daily.  . [DISCONTINUED] neomycin-polymyxin-hydrocortisone (CORTISPORIN) otic solution Use 3-4 drops 4 times daily with a cotton wick to the left ear as directed x7-10 day  . rosuvastatin (CRESTOR) 5 MG tablet Take 1 tablet (5 mg total) by mouth daily. (Patient  not taking: Reported on 03/27/2015)  . [DISCONTINUED] doxycycline (VIBRA-TABS) 100 MG tablet Take 1 tablet (100 mg total) by mouth 2 (two) times daily.   No facility-administered encounter medications on file as of 03/27/2015.      Review of Systems  Constitutional: Negative.   HENT: Positive for postnasal drip and sinus pressure.   Eyes: Negative.   Respiratory: Negative.   Cardiovascular: Negative.   Gastrointestinal: Negative.   Endocrine: Negative.   Genitourinary: Negative.   Musculoskeletal: Positive for arthralgias  (gout of right great toe).  Skin: Negative.   Allergic/Immunologic: Negative.   Neurological: Negative.   Hematological: Negative.   Psychiatric/Behavioral: Negative.        Objective:   Physical Exam  Constitutional: He is oriented to person, place, and time. He appears well-developed and well-nourished.  HENT:  Head: Normocephalic and atraumatic.  Right Ear: External ear normal.  Left Ear: External ear normal.  Mouth/Throat: Oropharynx is clear and moist. No oropharyngeal exudate.  Nasal congestion bilaterally  Eyes: Conjunctivae and EOM are normal. Pupils are equal, round, and reactive to light. Right eye exhibits no discharge. Left eye exhibits no discharge. No scleral icterus.  Neck: Normal range of motion. Neck supple. No thyromegaly present.  No anterior cervical adenopathy or bruits  Cardiovascular: Normal rate, regular rhythm, normal heart sounds and intact distal pulses.  Exam reveals no friction rub.   No murmur heard. At 96/m with a regular rate and rhythm  Pulmonary/Chest: Effort normal and breath sounds normal. No respiratory distress. He has no wheezes. He has no rales. He exhibits no tenderness.  Clear anteriorly and posteriorly  Abdominal: Soft. Bowel sounds are normal. He exhibits no mass. There is no tenderness. There is no rebound and no guarding.  No abdominal tenderness masses or bruits  Musculoskeletal: Normal range of motion. He exhibits no edema.  Lymphadenopathy:    He has no cervical adenopathy.  Neurological: He is alert and oriented to person, place, and time. He has normal reflexes. No cranial nerve deficit.  Skin: Skin is warm and dry. No rash noted.  Psychiatric: He has a normal mood and affect. His behavior is normal. Judgment and thought content normal.  Nursing note and vitals reviewed.  BP 144/89 mmHg  Pulse 109  Temp(Src) 97.7 F (36.5 C) (Oral)  Ht  (1.803 m)  Wt 233 lb (105.688 kg)  BMI 32.51 kg/m2        Assessment & Plan:   1. Hyperlipidemia -Continue fish oil and consider trying a different statin drug at the next visit. Continue aggressive therapeutic lifestyle changes. - omega-3 acid ethyl esters (LOVAZA) 1 G capsule; Take 2 capsules (2 g total) by mouth 2 (two) times daily.  Dispense: 270 capsule; Refill: 3  2. Depression -Try Prozac 10 mg daily. Call in 4 weeks and as long as there are no side effects with sexual dysfunction, we will increase to 20 mg at that time  3. Obesity -Continue to work aggressively on diet and exercise  4. Hypogonadism male -Continue AndroGel is doing  5. Acute idiopathic gout of right foot - Uric acid  6. URI (upper respiratory infection) -Take antibiotic as directed until completed and use nasal saline frequently for nasal congestion  Meds ordered this encounter  Medications  . omega-3 acid ethyl esters (LOVAZA) 1 G capsule    Sig: Take 2 capsules (2 g total) by mouth 2 (two) times daily.    Dispense:  270 capsule    Refill:  3  .  amoxicillin (AMOXIL) 500 MG capsule    Sig: Take 1 capsule (500 mg total) by mouth 3 (three) times daily.    Dispense:  30 capsule    Refill:  0  . FLUoxetine (PROZAC) 10 MG capsule    Sig: Take 1 capsule (10 mg total) by mouth daily.    Dispense:  30 capsule    Refill:  1   Patient Instructions  The patient should take the antibiotic as directed We will do a uric acid on the day because of concerns about you out with knees and to He will continue using the AndroGel as he has been doing He will drink plenty of fluids and stay well hydrated and use nasal saline for for head congestion He'll start the antidepressant Prozac 10 mg and will call us again in about 4 weeks and we can up it to 20 at that time as long as he is not having any side effects from the medicine Use nasal saline frequently and the nose 3 or 4 times daily--ayr   Nyra Capeson W. Moore MD

## 2015-03-27 NOTE — Patient Instructions (Addendum)
The patient should take the antibiotic as directed We will do a uric acid on the day because of concerns about you out with knees and to He will continue using the AndroGel as he has been doing He will drink plenty of fluids and stay well hydrated and use nasal saline for for head congestion He'll start the antidepressant Prozac 10 mg and will call us again in about 4 weeks and we can up it to 20 at that time as long as he is not having any side effects from the medicine Use nasal saline frequently and the nose 3 or 4 times daily--ayr

## 2015-03-28 LAB — URIC ACID: URIC ACID: 8.7 mg/dL — AB (ref 3.7–8.6)

## 2015-04-07 ENCOUNTER — Telehealth: Payer: Self-pay | Admitting: Family Medicine

## 2015-04-07 NOTE — Telephone Encounter (Signed)
Per [facility]  - pt already has DOXY on hand - her will take 100 mg BID with Food for 14 days.

## 2015-04-19 ENCOUNTER — Telehealth: Payer: Self-pay | Admitting: *Deleted

## 2015-04-19 MED ORDER — FLUOXETINE HCL 20 MG PO CAPS: 20.0000 mg | ORAL_CAPSULE | Freq: Every day | ORAL | Status: DC

## 2015-04-19 NOTE — Telephone Encounter (Signed)
The patient can be increased to 20 mg daily please call a new prescription in for this strength

## 2015-04-19 NOTE — Telephone Encounter (Signed)
Patient states that he has not seen much change with the prozac and wants to see if we can increase to 20mg . Please advise

## 2015-04-19 NOTE — Telephone Encounter (Signed)
Pt aware.

## 2015-05-11 ENCOUNTER — Ambulatory Visit (INDEPENDENT_AMBULATORY_CARE_PROVIDER_SITE_OTHER): Payer: BLUE CROSS/BLUE SHIELD | Admitting: Family Medicine

## 2015-05-11 ENCOUNTER — Encounter: Payer: Self-pay | Admitting: Family Medicine

## 2015-05-11 VITALS — BP 126/80 | HR 88 | Temp 97.9°F | Ht 71.0 in | Wt 235.2 lb

## 2015-05-11 DIAGNOSIS — R030 Elevated blood-pressure reading, without diagnosis of hypertension: Secondary | ICD-10-CM

## 2015-05-11 NOTE — Patient Instructions (Signed)
Great to meet you!  Come back in 1 month, keep a blood pressure log, we can make decisions together  Cut out caffeine, This is in soda, coffee, and Tea

## 2015-05-11 NOTE — Progress Notes (Signed)
   HPI  Patient presents today to discuss her blood pressure and racing heart.  Patient explains that starting last Saturday he noticed that his pulse was 105 105 while checking his pulse when his wife was sick. Next few days he continued to have pulse ranged from 9210. [facility] and found to be 154/93. He has had stable dyspnea with exercise, but seems a little bit more than usual, over the last 2 years or so.  He denies any chest pain. No palpitations. He is pretty anxious and has lots of things for on his family right now that are stressful.  He was seen at urgent care yesterday and found to have a normal EKG, he did have something that appeared to be the beginnings of a delta wave and only lead two, this did not meet criteria  PMH: Smoking status noted ROS: Per HPI  Objective: BP 126/80 mmHg  Pulse 88  Temp(Src) 97.9 F (36.6 C) (Oral)  Ht  (1.803 m)  Wt 235 lb 3.2 oz (106.686 kg)  BMI 32.82 kg/m2 Gen: NAD, alert, cooperative with exam HEENT: NCAT, CV: RRR, good S1/S2, no murmur Resp: CTABL, no wheezes, non-labored Ext: No edema, warm Neuro: Alert and oriented, No gross deficits  Assessment and plan:  # Elevated blood pressure, tachycardia His blood pressure and heart rate are normal today, I believe that this is most likely secondary to anxiety. He does have a work physical coming up but given him a blood pressure log and asked him to keep 3-4 readings per week. Consider low-dose beta blocker if he is persistently over 144/100 heart rate  Consider reeat TSH if persistently elevated   Murtis Sink, MD [place] [facility] 05/11/2015, 8:24 AM

## 2015-05-14 ENCOUNTER — Ambulatory Visit: Payer: BLUE CROSS/BLUE SHIELD | Admitting: Family Medicine

## 2015-06-01 ENCOUNTER — Encounter: Payer: Self-pay | Admitting: Family Medicine

## 2015-06-01 ENCOUNTER — Ambulatory Visit (INDEPENDENT_AMBULATORY_CARE_PROVIDER_SITE_OTHER): Payer: BLUE CROSS/BLUE SHIELD | Admitting: Family Medicine

## 2015-06-01 VITALS — BP 131/86 | HR 107 | Temp 98.0°F | Ht 71.0 in | Wt 239.0 lb

## 2015-06-01 DIAGNOSIS — N400 Enlarged prostate without lower urinary tract symptoms: Secondary | ICD-10-CM

## 2015-06-01 DIAGNOSIS — E785 Hyperlipidemia, unspecified: Secondary | ICD-10-CM

## 2015-06-01 DIAGNOSIS — IMO0001 Reserved for inherently not codable concepts without codable children: Secondary | ICD-10-CM

## 2015-06-01 DIAGNOSIS — F4323 Adjustment disorder with mixed anxiety and depressed mood: Secondary | ICD-10-CM

## 2015-06-01 DIAGNOSIS — K219 Gastro-esophageal reflux disease without esophagitis: Secondary | ICD-10-CM | POA: Diagnosis not present

## 2015-06-01 DIAGNOSIS — M109 Gout, unspecified: Secondary | ICD-10-CM

## 2015-06-01 DIAGNOSIS — R0989 Other specified symptoms and signs involving the circulatory and respiratory systems: Secondary | ICD-10-CM | POA: Diagnosis not present

## 2015-06-01 DIAGNOSIS — F329 Major depressive disorder, single episode, unspecified: Secondary | ICD-10-CM | POA: Insufficient documentation

## 2015-06-01 DIAGNOSIS — E559 Vitamin D deficiency, unspecified: Secondary | ICD-10-CM

## 2015-06-01 DIAGNOSIS — F32A Depression, unspecified: Secondary | ICD-10-CM

## 2015-06-01 DIAGNOSIS — R030 Elevated blood-pressure reading, without diagnosis of hypertension: Secondary | ICD-10-CM

## 2015-06-01 DIAGNOSIS — E291 Testicular hypofunction: Secondary | ICD-10-CM

## 2015-06-01 DIAGNOSIS — F418 Other specified anxiety disorders: Secondary | ICD-10-CM | POA: Insufficient documentation

## 2015-06-01 DIAGNOSIS — Z0289 Encounter for other administrative examinations: Secondary | ICD-10-CM

## 2015-06-01 NOTE — Progress Notes (Signed)
Subjective:    Patient ID: Scott Rasmussen, male    DOB: April 28, 1965, 50 y.o.   MRN: [id number]  HPI Pt here for follow up and management of chronic medical problems which includes hyperlipidemia and decreased testosterone. He is taking medications regularly. The patient stopped the antidepressant medicine that he was taking because he did not think that it helped any. He is considering stopping his testosterone treatment because he is not sure that that is helping him any either. The patient has a lot of stress and anxiety on his plate right now. He is seeing a Social worker and discussing this information in full with a counselor. He is concerned about his daughter who has had a recent breakdown and has decided not to go to [facility] He is concerned to some extent about the relationship he has with his wife. He is also concerned and worried because his father died at an early age of a heart attack but we reassured him today that he had a stress test and this is good that this was normal. He denies any shortness of breath. He does have a history of hiatal hernia but is doing well with that presently. He denies any chest pain. He is not having any trouble with his stomach and his stools are normal without blood. He is passing his water without problems. He has had more problems with gout in the right great toe.      Patient Active Problem List   Diagnosis Date Noted  . Depression 06/01/2015  . Allergic rhinitis 08/15/2014  . Abnormal liver function test 11/08/2013  . Obesity (BMI 30.0-34.9) 01/03/2013  . Gout 11/26/2012  . Hyperlipidemia 11/26/2012  . GERD (gastroesophageal reflux disease) 11/26/2012   Outpatient Encounter Prescriptions as of 06/01/2015  Medication Sig  . fenofibrate (TRICOR) 145 MG tablet Take 1 tablet (145 mg total) by mouth daily.  . fluocinonide-emollient (LIDEX-E) 0.05 % cream Apply 1 application topically 2 (two) times daily. Use sparingly for 7-10  . fluticasone  (FLONASE) 50 MCG/ACT nasal spray Place 2 sprays into both nostrils daily.  Marland Kitchen loratadine (CLARITIN) 10 MG tablet Take 1 tablet (10 mg total) by mouth daily.  Marland Kitchen omega-3 acid ethyl esters (LOVAZA) 1 G capsule Take 2 capsules (2 g total) by mouth 2 (two) times daily.  Marland Kitchen omeprazole (PRILOSEC) 40 MG capsule Take 1 capsule (40 mg total) by mouth daily.  . Testosterone (ANDROGEL PUMP) 20.25 MG/ACT (1.62%) GEL Place 1 application onto the skin daily.   No facility-administered encounter medications on file as of 06/01/2015.     Review of Systems  Constitutional: Negative.   HENT: Negative.   Eyes: Negative.   Respiratory: Negative.   Cardiovascular: Negative.        Elevated HR  Gastrointestinal: Negative.   Endocrine: Negative.   Genitourinary: Negative.   Musculoskeletal: Negative.        Gout flare - right great toe  Skin: Negative.   Allergic/Immunologic: Negative.   Neurological: Negative.   Hematological: Negative.   Psychiatric/Behavioral: Negative.        Objective:   Physical Exam  Constitutional: He is oriented to person, place, and time. He appears well-developed and well-nourished. No distress.  HENT:  Head: Normocephalic and atraumatic.  Right Ear: External ear normal.  Left Ear: External ear normal.  Nose: Nose normal.  Mouth/Throat: Oropharynx is clear and moist. No oropharyngeal exudate.  Eyes: Conjunctivae and EOM are normal. Pupils are equal, round, and reactive to light. Right eye  exhibits no discharge. Left eye exhibits no discharge. No scleral icterus.  Neck: Normal range of motion. Neck supple. No thyromegaly present.  Without bruits or thyromegaly  Cardiovascular: Normal rate, regular rhythm, normal heart sounds and intact distal pulses.   No murmur heard. The heart rate is anywhere from 84-96/m and regular  Pulmonary/Chest: Effort normal and breath sounds normal. No respiratory distress. He has no wheezes. He has no rales. He exhibits no tenderness.  Chest  clear anteriorly and posteriorly  Abdominal: Soft. Bowel sounds are normal. He exhibits no mass. There is no tenderness. There is no rebound and no guarding.  The abdomen is slightly obese without masses tenderness or organ enlargement  Genitourinary: Rectum normal and prostate normal.  Only a rectal exam was done today. There were no rectal masses. The prostate was slightly enlarged but soft and smooth without masses.  Musculoskeletal: Normal range of motion. He exhibits no edema.  Lymphadenopathy:    He has no cervical adenopathy.  Neurological: He is alert and oriented to person, place, and time.  Skin: Skin is warm and dry. No rash noted.  Psychiatric: He has a normal mood and affect. His behavior is normal. Judgment and thought content normal.  The patient is very anxious and uptight about his multiple issues in his life worrying about his daughter worrying about his relationship with his wife worrying about things that happened as a child worrying about losing his dad when he was 61 years of age.  Nursing note and vitals reviewed.  BP 131/86 mmHg  Pulse 107  Temp(Src) 98 F (36.7 C) (Oral)  Ht _0  (1.803 m)  Wt 239 lb (108.41 kg)  BMI 33.35 kg/m2        Assessment & Plan:  1. Hyperlipidemia -Continue fenofibrate and aggressive therapeutic lifestyle changes - BMP8+EGFR; Future - CBC with Differential/Platelet; Future - Hepatic function panel; Future - NMR, lipoprofile; Future  2. Hypogonadism male -Continue current AndroGel dose pending results of lab work. He is currently doing 1. Daily. - CBC with Differential/Platelet; Future - PSA, total and free; Future - Testosterone,Free and Total; Future - POCT urinalysis dipstick - POCT UA - Microscopic Only  3. Vitamin D deficiency -Continue current treatment which according to med records is nothing currently pending results of lab work - CBC with Differential/Platelet; Future - VITAMIN D 25 Hydroxy (Vit-D Deficiency,  Fractures); Future  4. Gastroesophageal reflux disease, esophagitis presence not specified -This seems to be stable currently - CBC with Differential/Platelet; Future - Hepatic function panel; Future  5. BPH (benign prostatic hyperplasia) -The prostate is slightly enlarged and there is no lumps or masses. There are no rectal masses. This was checked today because the patient has been on testosterone and we will check this every 6 months - CBC with Differential/Platelet; Future - PSA, total and free; Future - Testosterone,Free and Total; Future - POCT urinalysis dipstick - POCT UA - Microscopic Only  6. Elevated blood pressure reading without diagnosis of hypertension -The blood pressure is good today but the patient will continue to monitor this at home and work on continued weight loss and sodium restriction and reduced his caffeine intake - BMP8+EGFR; Future - CBC with Differential/Platelet; Future - Hepatic function panel; Future - Thyroid Panel With TSH; Future  7. Depression -Continue counseling sessions  8. Elevated heart rate and blood pressure -Continue sodium restriction and caffeine restriction and monitor heart rate and blood pressure and bring these readings in the office in 4 weeks for review.  If the heart rate remains elevated consistently greater than 100 and all the lab work is good we will consider talking to the cardiologist about putting him on a low-dose beta blocker. - Thyroid Panel With TSH; Future  9. Gout of right foot, unspecified cause, unspecified chronicity -Check uric acid. The toe was not inflamed or hot today. - Uric acid; Future  10. Adjustment disorder with mixed anxiety and depressed mood -Continue with seeing counselor and discussing issues going on in his life  No orders of the defined types were placed in this encounter.   Patient Instructions  Continue current medications. Continue good therapeutic lifestyle changes which include good diet  and exercise. Fall precautions discussed with patient. If an FOBT was given today- please return it to our front desk. If you are over 60 years old - you may need Prevnar 30 or the adult Pneumonia vaccine.  **Flu shots are available--- please call and schedule a FLU-CLINIC appointment**  After your visit with [place] today you will receive a survey in the mail or online from [employer] regarding your care with [place]. Please take a moment to fill this out. Your feedback is very important to [place] as you can help [place] better understand your patient needs as well as improve your experience and satisfaction. WE CARE ABOUT YOU!!!   Continue to see the counselor Discontinue caffeine Check BP and pulse and bring in readings in 4 weeks  - if at this time heart rate is still running over 100 - we will contact cardiology to discuss options.  When the lab work is returned we will call you with the results and at that time we'll consider increasing the AndroGel    Arrie Senate MD

## 2015-06-01 NOTE — Patient Instructions (Addendum)
Continue current medications. Continue good therapeutic lifestyle changes which include good diet and exercise. Fall precautions discussed with patient. If an FOBT was given today- please return it to our front desk. If you are over 48 years old - you may need Prevnar 13 or the adult Pneumonia vaccine.  **Flu shots are available--- please call and schedule a FLU-CLINIC appointment**  After your visit with [place] today you will receive a survey in the mail or online from [employer] regarding your care with [place]. Please take a moment to fill this out. Your feedback is very important to [place] as you can help [place] better understand your patient needs as well as improve your experience and satisfaction. WE CARE ABOUT YOU!!!   Continue to see the counselor Discontinue caffeine Check BP and pulse and bring in readings in 4 weeks  - if at this time heart rate is still running over 100 - we will contact cardiology to discuss options.  When the lab work is returned we will call you with the results and at that time we'll consider increasing the AndroGel

## 2015-06-04 ENCOUNTER — Ambulatory Visit (INDEPENDENT_AMBULATORY_CARE_PROVIDER_SITE_OTHER): Payer: BLUE CROSS/BLUE SHIELD

## 2015-06-04 ENCOUNTER — Other Ambulatory Visit: Payer: Self-pay | Admitting: *Deleted

## 2015-06-04 ENCOUNTER — Other Ambulatory Visit: Payer: BLUE CROSS/BLUE SHIELD

## 2015-06-04 DIAGNOSIS — M25551 Pain in right hip: Secondary | ICD-10-CM | POA: Diagnosis not present

## 2015-06-04 DIAGNOSIS — K219 Gastro-esophageal reflux disease without esophagitis: Secondary | ICD-10-CM

## 2015-06-04 DIAGNOSIS — E291 Testicular hypofunction: Secondary | ICD-10-CM

## 2015-06-04 DIAGNOSIS — R030 Elevated blood-pressure reading, without diagnosis of hypertension: Secondary | ICD-10-CM

## 2015-06-04 DIAGNOSIS — N400 Enlarged prostate without lower urinary tract symptoms: Secondary | ICD-10-CM

## 2015-06-04 DIAGNOSIS — M109 Gout, unspecified: Secondary | ICD-10-CM

## 2015-06-04 DIAGNOSIS — E559 Vitamin D deficiency, unspecified: Secondary | ICD-10-CM

## 2015-06-04 DIAGNOSIS — E785 Hyperlipidemia, unspecified: Secondary | ICD-10-CM

## 2015-06-04 DIAGNOSIS — IMO0001 Reserved for inherently not codable concepts without codable children: Secondary | ICD-10-CM

## 2015-06-05 ENCOUNTER — Other Ambulatory Visit: Payer: Self-pay | Admitting: *Deleted

## 2015-06-05 MED ORDER — TESTOSTERONE 20.25 MG/ACT (1.62%) TD GEL: 2.0000 "application " | Freq: Every day | TRANSDERMAL | Status: DC

## 2015-06-06 ENCOUNTER — Other Ambulatory Visit: Payer: BLUE CROSS/BLUE SHIELD

## 2015-06-06 DIAGNOSIS — Z1212 Encounter for screening for malignant neoplasm of rectum: Secondary | ICD-10-CM

## 2015-06-06 LAB — HEPATIC FUNCTION PANEL
ALBUMIN: 4.8 g/dL (ref 3.5–5.5)
ALK PHOS: 52 IU/L (ref 39–117)
ALT: 44 IU/L (ref 0–44)
AST: 30 IU/L (ref 0–40)
Bilirubin Total: 0.4 mg/dL (ref 0.0–1.2)
Bilirubin, Direct: 0.12 mg/dL (ref 0.00–0.40)
TOTAL PROTEIN: 7.1 g/dL (ref 6.0–8.5)

## 2015-06-06 LAB — CBC WITH DIFFERENTIAL/PLATELET
BASOS ABS: 0 10*3/uL (ref 0.0–0.2)
Basos: 1 %
EOS (ABSOLUTE): 0.2 10*3/uL (ref 0.0–0.4)
Eos: 4 %
HEMOGLOBIN: 17.1 g/dL (ref 12.6–17.7)
Hematocrit: 50.3 % (ref 37.5–51.0)
Immature Grans (Abs): 0 10*3/uL (ref 0.0–0.1)
Immature Granulocytes: 0 %
LYMPHS ABS: 1.8 10*3/uL (ref 0.7–3.1)
Lymphs: 35 %
MCH: 29.6 pg (ref 26.6–33.0)
MCHC: 34 g/dL (ref 31.5–35.7)
MCV: 87 fL (ref 79–97)
MONOCYTES: 9 %
Monocytes Absolute: 0.4 10*3/uL (ref 0.1–0.9)
Neutrophils Absolute: 2.6 10*3/uL (ref 1.4–7.0)
Neutrophils: 51 %
PLATELETS: 319 10*3/uL (ref 150–379)
RBC: 5.77 x10E6/uL (ref 4.14–5.80)
RDW: 13.7 % (ref 12.3–15.4)
WBC: 5.1 10*3/uL (ref 3.4–10.8)

## 2015-06-06 LAB — THYROID PANEL WITH TSH
Free Thyroxine Index: 2.3 (ref 1.2–4.9)
T3 Uptake Ratio: 30 % (ref 24–39)
T4, Total: 7.6 ug/dL (ref 4.5–12.0)
TSH: 2.06 u[IU]/mL (ref 0.450–4.500)

## 2015-06-06 LAB — BMP8+EGFR
BUN / CREAT RATIO: 13 (ref 9–20)
BUN: 13 mg/dL (ref 6–24)
CALCIUM: 10 mg/dL (ref 8.7–10.2)
CHLORIDE: 101 mmol/L (ref 96–106)
CO2: 24 mmol/L (ref 18–29)
Creatinine, Ser: 1.04 mg/dL (ref 0.76–1.27)
GFR calc Af Amer: 98 mL/min/{1.73_m2} (ref >59)
GFR calc non Af Amer: 85 mL/min/{1.73_m2} (ref >59)
GLUCOSE: 88 mg/dL (ref 65–99)
Potassium: 4.2 mmol/L (ref 3.5–5.2)
Sodium: 144 mmol/L (ref 134–144)

## 2015-06-06 LAB — LIPID PANEL
CHOL/HDL RATIO: 4.6 ratio (ref 0.0–5.0)
Cholesterol, Total: 152 mg/dL (ref 100–199)
HDL: 33 mg/dL — ABNORMAL LOW (ref >39)
LDL CALC: 80 mg/dL (ref 0–99)
TRIGLYCERIDES: 193 mg/dL — AB (ref 0–149)
VLDL Cholesterol Cal: 39 mg/dL (ref 5–40)

## 2015-06-06 LAB — TESTOSTERONE,FREE AND TOTAL
TESTOSTERONE FREE: 11.6 pg/mL (ref 6.8–21.5)
TESTOSTERONE: 290 ng/dL — AB (ref 348–1197)

## 2015-06-06 LAB — PSA, TOTAL AND FREE
PSA FREE PCT: 40 %
PSA FREE: 0.08 ng/mL
Prostate Specific Ag, Serum: 0.2 ng/mL (ref 0.0–4.0)

## 2015-06-06 LAB — VITAMIN D 25 HYDROXY (VIT D DEFICIENCY, FRACTURES): Vit D, 25-Hydroxy: 34.9 ng/mL (ref 30.0–100.0)

## 2015-06-06 LAB — URIC ACID: Uric Acid: 6.9 mg/dL (ref 3.7–8.6)

## 2015-06-07 LAB — FECAL OCCULT BLOOD, IMMUNOCHEMICAL: Fecal Occult Bld: NEGATIVE

## 2015-06-27 ENCOUNTER — Ambulatory Visit: Payer: BLUE CROSS/BLUE SHIELD | Admitting: Family Medicine

## 2015-06-29 ENCOUNTER — Ambulatory Visit: Payer: BLUE CROSS/BLUE SHIELD | Admitting: Family Medicine

## 2015-07-04 ENCOUNTER — Other Ambulatory Visit: Payer: Self-pay | Admitting: *Deleted

## 2015-07-04 ENCOUNTER — Ambulatory Visit: Payer: BLUE CROSS/BLUE SHIELD | Admitting: Family Medicine

## 2015-07-04 ENCOUNTER — Encounter: Payer: Self-pay | Admitting: *Deleted

## 2015-07-04 MED ORDER — FLUTICASONE PROPIONATE 50 MCG/ACT NA SUSP: 2.0000 | Freq: Every day | NASAL | Status: DC

## 2015-07-04 MED ORDER — LORATADINE 10 MG PO TABS: 10.0000 mg | ORAL_TABLET | Freq: Every day | ORAL | Status: DC

## 2015-07-04 NOTE — Progress Notes (Signed)
Patient came by today to check in with DWM about his depression and his BP.  His BP is some better - reading today was 128 / 88 and 108 / 80  He is going to discuss getting a more in depth counselor and / or psychiatrist.

## 2015-08-08 DIAGNOSIS — E538 Deficiency of other specified B group vitamins: Secondary | ICD-10-CM | POA: Diagnosis not present

## 2015-08-08 DIAGNOSIS — E669 Obesity, unspecified: Secondary | ICD-10-CM | POA: Diagnosis not present

## 2015-08-08 DIAGNOSIS — K219 Gastro-esophageal reflux disease without esophagitis: Secondary | ICD-10-CM | POA: Diagnosis not present

## 2015-08-08 DIAGNOSIS — E786 Lipoprotein deficiency: Secondary | ICD-10-CM | POA: Diagnosis not present

## 2015-08-16 DIAGNOSIS — E786 Lipoprotein deficiency: Secondary | ICD-10-CM | POA: Diagnosis not present

## 2015-08-16 DIAGNOSIS — E669 Obesity, unspecified: Secondary | ICD-10-CM | POA: Diagnosis not present

## 2015-08-16 DIAGNOSIS — K219 Gastro-esophageal reflux disease without esophagitis: Secondary | ICD-10-CM | POA: Diagnosis not present

## 2015-08-16 DIAGNOSIS — E538 Deficiency of other specified B group vitamins: Secondary | ICD-10-CM | POA: Diagnosis not present

## 2015-08-22 DIAGNOSIS — E786 Lipoprotein deficiency: Secondary | ICD-10-CM | POA: Diagnosis not present

## 2015-08-22 DIAGNOSIS — E538 Deficiency of other specified B group vitamins: Secondary | ICD-10-CM | POA: Diagnosis not present

## 2015-08-22 DIAGNOSIS — K219 Gastro-esophageal reflux disease without esophagitis: Secondary | ICD-10-CM | POA: Diagnosis not present

## 2015-08-22 DIAGNOSIS — E669 Obesity, unspecified: Secondary | ICD-10-CM | POA: Diagnosis not present

## 2015-10-01 ENCOUNTER — Other Ambulatory Visit: Payer: Self-pay | Admitting: *Deleted

## 2015-10-01 DIAGNOSIS — E785 Hyperlipidemia, unspecified: Secondary | ICD-10-CM

## 2015-10-01 MED ORDER — OMEGA-3-ACID ETHYL ESTERS 1 G PO CAPS
2.0000 g | ORAL_CAPSULE | Freq: Two times a day (BID) | ORAL | Status: DC
Start: 2015-10-01 — End: 2015-12-05

## 2015-10-01 MED ORDER — DOXYCYCLINE HYCLATE 100 MG PO TABS: 100.0000 mg | ORAL_TABLET | Freq: Two times a day (BID) | ORAL | Status: DC

## 2015-11-27 ENCOUNTER — Other Ambulatory Visit: Payer: Self-pay | Admitting: Family Medicine

## 2015-12-05 ENCOUNTER — Encounter: Payer: Self-pay | Admitting: Family Medicine

## 2015-12-05 ENCOUNTER — Ambulatory Visit (INDEPENDENT_AMBULATORY_CARE_PROVIDER_SITE_OTHER): Payer: BLUE CROSS/BLUE SHIELD | Admitting: Family Medicine

## 2015-12-05 VITALS — BP 120/77 | HR 85 | Temp 97.6°F | Ht 71.0 in | Wt 229.0 lb

## 2015-12-05 DIAGNOSIS — N400 Enlarged prostate without lower urinary tract symptoms: Secondary | ICD-10-CM

## 2015-12-05 DIAGNOSIS — K219 Gastro-esophageal reflux disease without esophagitis: Secondary | ICD-10-CM

## 2015-12-05 DIAGNOSIS — E291 Testicular hypofunction: Secondary | ICD-10-CM

## 2015-12-05 DIAGNOSIS — E349 Endocrine disorder, unspecified: Secondary | ICD-10-CM

## 2015-12-05 DIAGNOSIS — N489 Disorder of penis, unspecified: Secondary | ICD-10-CM

## 2015-12-05 DIAGNOSIS — I700 Atherosclerosis of aorta: Secondary | ICD-10-CM | POA: Insufficient documentation

## 2015-12-05 DIAGNOSIS — E785 Hyperlipidemia, unspecified: Secondary | ICD-10-CM

## 2015-12-05 DIAGNOSIS — R21 Rash and other nonspecific skin eruption: Secondary | ICD-10-CM

## 2015-12-05 DIAGNOSIS — E559 Vitamin D deficiency, unspecified: Secondary | ICD-10-CM

## 2015-12-05 LAB — URINALYSIS, COMPLETE
Bilirubin, UA: NEGATIVE
GLUCOSE, UA: NEGATIVE
KETONES UA: NEGATIVE
Leukocytes, UA: NEGATIVE
NITRITE UA: NEGATIVE
Protein, UA: NEGATIVE
SPEC GRAV UA: 1.02 (ref 1.005–1.030)
UUROB: 0.2 mg/dL (ref 0.2–1.0)
pH, UA: 6.5 (ref 5.0–7.5)

## 2015-12-05 LAB — MICROSCOPIC EXAMINATION
BACTERIA UA: NONE SEEN
EPITHELIAL CELLS (NON RENAL): NONE SEEN /HPF (ref 0–10)
RBC, UA: NONE SEEN /hpf (ref 0–?)

## 2015-12-05 MED ORDER — OMEGA-3-ACID ETHYL ESTERS 1 G PO CAPS: 2.0000 g | ORAL_CAPSULE | Freq: Two times a day (BID) | ORAL | 3 refills | Status: DC

## 2015-12-05 MED ORDER — OMEPRAZOLE 40 MG PO CPDR: 40.0000 mg | DELAYED_RELEASE_CAPSULE | Freq: Every day | ORAL | 3 refills | Status: DC

## 2015-12-05 MED ORDER — FLUTICASONE PROPIONATE 50 MCG/ACT NA SUSP: 2.0000 | Freq: Every day | NASAL | 3 refills | Status: DC

## 2015-12-05 MED ORDER — FENOFIBRATE 145 MG PO TABS: 145.0000 mg | ORAL_TABLET | Freq: Every day | ORAL | 3 refills | Status: DC

## 2015-12-05 NOTE — Patient Instructions (Addendum)
Continue current medications. Continue good therapeutic lifestyle changes which include good diet and exercise. Fall precautions discussed with patient. If an FOBT was given today- please return it to our front desk. If you are over 48 years old - you may need Prevnar 13 or the adult Pneumonia vaccine.   After your visit with us today you will receive a survey in the mail or online from [employer] regarding your care with us. Please take a moment to fill this out. Your feedback is very important to us as you can help us better understand your patient needs as well as improve your experience and satisfaction. WE CARE ABOUT YOU!!!   Continue to use soaps fabric softeners and detergents that are scent free Consider getting your spouse to have a vaginal wet prep to see if she might have a yeast infection You can hold the testosterone applications and we will continue to monitor this since you're not able to tell that testosterone has helped you any Continue to follow-up with the counselor and possibly with the psychiatrist, we will be glad to help you get an appointment with the psychiatrist if necessary Continue with aggressive therapeutic lifestyle changes and weight loss efforts

## 2015-12-05 NOTE — Addendum Note (Signed)
Addended by: Magdalene RiverBULLINS, Winta Barcelo H on: 12/05/2015 08:50 AM   Modules accepted: Orders

## 2015-12-05 NOTE — Progress Notes (Signed)
Subjective:    Patient ID: Scott Rasmussen, male    DOB: Oct 26, 1965, 50 y.o.   MRN: [id number]  HPI Pt here for follow up and management of chronic medical problems which includes hyperlipidemia and testosterone def. He is taking most medications regularly. The patient is doing well overall. He's had a lot stresses in his life which we have discussed in the past. He is due for a PSA and prostate exam today as he is continuing to take AndroGel. He will get lab work today and a urinalysis today. The good news is also that his weight is down 10 pounds from the previous check. He did have a stress test back in June 2016. He has been out of his AndroGel for 2 weeks. He is requesting refills on several of his medicines. The patient is not sure that the AndroGel is making any difference. And this is one reason why he left it off for 2 weeks to see what his testosterone level is today. He is talking to a counselor and he has been opened and above board with her regarding the issues with his wife has children and an issue that occurred with an uncles son in his early life. He denies any chest pain shortness of breath trouble swallowing heartburn indigestion nausea vomiting diarrhea blood in the stool or black tarry bowel movements. He also denies any trouble with passing his water. He is due to get an eye exam tomorrow he will make sure that they send [place] a copy of that report.      Patient Active Problem List   Diagnosis Date Noted  . Depression 06/01/2015  . Allergic rhinitis 08/15/2014  . Abnormal liver function test 11/08/2013  . Obesity (BMI 30.0-34.9) 01/03/2013  . Gout 11/26/2012  . Hyperlipidemia 11/26/2012  . GERD (gastroesophageal reflux disease) 11/26/2012   Outpatient Encounter Prescriptions as of 12/05/2015  Medication Sig  . fenofibrate (TRICOR) 145 MG tablet Take 1 tablet (145 mg total) by mouth daily.  . fluocinonide-emollient (LIDEX-E) 0.05 % cream APPLY 1 APPLICATION TOPICALLY 2  (TWO) TIMES DAILY. USE SPARINGLY FOR 7-10 DAYS  . fluticasone (FLONASE) 50 MCG/ACT nasal spray Place 2 sprays into both nostrils daily.  Marland Kitchen omega-3 acid ethyl esters (LOVAZA) 1 g capsule Take 2 capsules (2 g total) by mouth 2 (two) times daily.  Marland Kitchen omeprazole (PRILOSEC) 40 MG capsule Take 1 capsule (40 mg total) by mouth daily.  Marland Kitchen loratadine (CLARITIN) 10 MG tablet Take 1 tablet (10 mg total) by mouth daily. (Patient not taking: Reported on 12/05/2015)  . Testosterone (ANDROGEL PUMP) 20.25 MG/ACT (1.62%) GEL Place 2 application onto the skin daily. (Patient not taking: Reported on 12/05/2015)  . [DISCONTINUED] doxycycline (VIBRA-TABS) 100 MG tablet Take 1 tablet (100 mg total) by mouth 2 (two) times daily.   No facility-administered encounter medications on file as of 12/05/2015.      Review of Systems  Constitutional: Negative.   HENT: Negative.   Eyes: Negative.   Respiratory: Negative.   Cardiovascular: Negative.   Gastrointestinal: Negative.   Endocrine: Negative.   Genitourinary: Negative.   Musculoskeletal: Negative.   Skin: Negative.   Allergic/Immunologic: Negative.   Neurological: Negative.   Hematological: Negative.   Psychiatric/Behavioral: Negative.        Objective:   Physical Exam  Constitutional: He is oriented to person, place, and time. He appears well-developed and well-nourished. No distress.  HENT:  Head: Normocephalic and atraumatic.  Right Ear: External ear normal.  Left Ear:  External ear normal.  Mouth/Throat: Oropharynx is clear and moist. No oropharyngeal exudate.  Slight nasal congestion and turbinate swelling right greater than left  Eyes: Conjunctivae and EOM are normal. Pupils are equal, round, and reactive to light. Right eye exhibits no discharge. Left eye exhibits no discharge. No scleral icterus.  Neck: Normal range of motion. Neck supple. No thyromegaly present.  No bruits or thyroid enlargement  Cardiovascular: Normal rate, regular rhythm, normal  heart sounds and intact distal pulses.   No murmur heard. Heart is regular at 84/m  Pulmonary/Chest: Effort normal and breath sounds normal. No respiratory distress. He has no wheezes. He has no rales. He exhibits no tenderness.  Clear anteriorly and posteriorly  Abdominal: Soft. Bowel sounds are normal. He exhibits no mass. There is no tenderness. There is no rebound and no guarding.  No abdominal tenderness or organ enlargement or bruits  Genitourinary: Rectum normal, prostate normal and penis normal. No penile tenderness.  Genitourinary Comments: The prostate is slightly enlarged but soft and smooth. The rectal was clear of masses. There were no inguinal hernias palpable. External genitalia were within normal limits. There are no rashes apparent today.  Musculoskeletal: Normal range of motion. He exhibits no edema.  Lymphadenopathy:    He has no cervical adenopathy.  Neurological: He is alert and oriented to person, place, and time. He has normal reflexes. No cranial nerve deficit.  Skin: Skin is warm and dry. No rash noted.  Psychiatric: He has a normal mood and affect. His behavior is normal. Judgment and thought content normal.  The patient is very open about talking about some of the stressful issues in his life and he is discussing all this with his counselor and is waiting on appointment with the psychiatrist as he has been intolerant to some of the medicines that he was given in the past for depression.  Nursing note and vitals reviewed.  BP 120/77 (BP Location: Left Arm)   Pulse 85   Temp 97.6 F (36.4 C) (Oral)   Ht '5\' 11"'  (1.803 m)   Wt 229 lb (103.9 kg)   BMI 31.94 kg/m         Assessment & Plan:  1. Hyperlipidemia -Continue with aggressive therapeutic lifestyle changes and fenofibrate and omega-3 - BMP8+EGFR - CBC with Differential/Platelet - Hepatic function panel - NMR, lipoprofile  2. Hypogonadism male -The patient has been on AndroGel but discontinued this 2  weeks ago because he wanted to see what his testosterone level was without using it. Frankly, he cannot tell any difference with using it and not using it. He is inclined to think that he will leave it off even if the levels are low because he can't see any difference when he is using it. He understands the ramifications of testosterone deficiency. - CBC with Differential/Platelet  3. Vitamin D deficiency -Continue current treatment - CBC with Differential/Platelet - VITAMIN D 25 Hydroxy (Vit-D Deficiency, Fractures)  4. Gastroesophageal reflux disease, esophagitis presence not specified -Patient is having no symptoms with this and we'll continue with his current treatment - CBC with Differential/Platelet - Hepatic function panel  5. BPH (benign prostatic hyperplasia) -The prostate is slightly enlarged with no lumps or masses and no trouble with voiding. - CBC with Differential/Platelet - PSA, total and free - Testosterone,Free and Total  6. Testosterone deficiency -Hold testosterone applications as discussed above - CBC with Differential/Platelet - PSA, total and free - Testosterone,Free and Total - Urinalysis, Complete  7. Penile rash -Currently  there is no rash today and the patient will pay attention to soaps fabric softeners and detergents and possibly getting his spouse to have a vaginal wet prep   Several the patient's medications will be renewed.  Patient Instructions  Continue current medications. Continue good therapeutic lifestyle changes which include good diet and exercise. Fall precautions discussed with patient. If an FOBT was given today- please return it to our front desk. If you are over 58 years old - you may need Prevnar 56 or the adult Pneumonia vaccine.   After your visit with [place] today you will receive a survey in the mail or online from [employer] regarding your care with [place]. Please take a moment to fill this out. Your feedback is very important to [place]  as you can help [place] better understand your patient needs as well as improve your experience and satisfaction. WE CARE ABOUT YOU!!!   Continue to use soaps fabric softeners and detergents that are scent free Consider getting your spouse to have a vaginal wet prep to see if she might have a yeast infection You can hold the testosterone applications and we will continue to monitor this since you're not able to tell that testosterone has helped you any Continue to follow-up with the counselor and possibly with the psychiatrist, we will be glad to help you get an appointment with the psychiatrist if necessary Continue with aggressive therapeutic lifestyle changes and weight loss efforts    Arrie Senate MD

## 2015-12-06 LAB — PSA, TOTAL AND FREE
PROSTATE SPECIFIC AG, SERUM: 0.2 ng/mL (ref 0.0–4.0)
PSA FREE: 0.07 ng/mL
PSA, Free Pct: 35 %

## 2015-12-06 LAB — CBC WITH DIFFERENTIAL/PLATELET
BASOS ABS: 0 10*3/uL (ref 0.0–0.2)
Basos: 1 %
EOS (ABSOLUTE): 0.3 10*3/uL (ref 0.0–0.4)
EOS: 6 %
HEMATOCRIT: 49 % (ref 37.5–51.0)
HEMOGLOBIN: 16.5 g/dL (ref 12.6–17.7)
IMMATURE GRANS (ABS): 0 10*3/uL (ref 0.0–0.1)
Immature Granulocytes: 0 %
LYMPHS: 29 %
Lymphocytes Absolute: 1.5 10*3/uL (ref 0.7–3.1)
MCH: 29.4 pg (ref 26.6–33.0)
MCHC: 33.7 g/dL (ref 31.5–35.7)
MCV: 87 fL (ref 79–97)
MONOCYTES: 8 %
Monocytes Absolute: 0.4 10*3/uL (ref 0.1–0.9)
Neutrophils Absolute: 2.9 10*3/uL (ref 1.4–7.0)
Neutrophils: 56 %
Platelets: 304 10*3/uL (ref 150–379)
RBC: 5.61 x10E6/uL (ref 4.14–5.80)
RDW: 13.5 % (ref 12.3–15.4)
WBC: 5.2 10*3/uL (ref 3.4–10.8)

## 2015-12-06 LAB — TESTOSTERONE,FREE AND TOTAL
Testosterone, Free: 9.2 pg/mL (ref 6.8–21.5)
Testosterone: 250 ng/dL — ABNORMAL LOW (ref 264–916)

## 2015-12-06 LAB — BMP8+EGFR
BUN / CREAT RATIO: 16 (ref 9–20)
BUN: 19 mg/dL (ref 6–24)
CALCIUM: 9.4 mg/dL (ref 8.7–10.2)
CO2: 20 mmol/L (ref 18–29)
CREATININE: 1.19 mg/dL (ref 0.76–1.27)
Chloride: 104 mmol/L (ref 96–106)
GFR, EST AFRICAN AMERICAN: 83 mL/min/{1.73_m2} (ref >59)
GFR, EST NON AFRICAN AMERICAN: 72 mL/min/{1.73_m2} (ref >59)
Glucose: 94 mg/dL (ref 65–99)
Potassium: 4.3 mmol/L (ref 3.5–5.2)
Sodium: 144 mmol/L (ref 134–144)

## 2015-12-06 LAB — NMR, LIPOPROFILE
Cholesterol: 135 mg/dL (ref 100–199)
HDL Cholesterol by NMR: 35 mg/dL — ABNORMAL LOW (ref >39)
HDL PARTICLE NUMBER: 28.2 umol/L — AB (ref >=30.5)
LDL Particle Number: 1114 nmol/L — ABNORMAL HIGH (ref <1000)
LDL SIZE: 20.6 nm (ref >20.5)
LDL-C: 69 mg/dL (ref 0–99)
LP-IR SCORE: 85 — AB (ref <=45)
SMALL LDL PARTICLE NUMBER: 580 nmol/L — AB (ref <=527)
Triglycerides by NMR: 156 mg/dL — ABNORMAL HIGH (ref 0–149)

## 2015-12-06 LAB — HEPATIC FUNCTION PANEL
ALK PHOS: 57 IU/L (ref 39–117)
ALT: 26 IU/L (ref 0–44)
AST: 25 IU/L (ref 0–40)
Albumin: 4.6 g/dL (ref 3.5–5.5)
BILIRUBIN, DIRECT: 0.08 mg/dL (ref 0.00–0.40)
Bilirubin Total: 0.3 mg/dL (ref 0.0–1.2)
Total Protein: 6.5 g/dL (ref 6.0–8.5)

## 2016-01-09 ENCOUNTER — Ambulatory Visit: Payer: BLUE CROSS/BLUE SHIELD | Admitting: Family Medicine

## 2016-01-10 ENCOUNTER — Other Ambulatory Visit: Payer: Self-pay

## 2016-01-10 ENCOUNTER — Encounter: Payer: Self-pay | Admitting: Nurse Practitioner

## 2016-01-10 ENCOUNTER — Encounter: Payer: Self-pay | Admitting: Family Medicine

## 2016-01-10 ENCOUNTER — Ambulatory Visit (INDEPENDENT_AMBULATORY_CARE_PROVIDER_SITE_OTHER): Payer: BLUE CROSS/BLUE SHIELD | Admitting: Nurse Practitioner

## 2016-01-10 VITALS — BP 137/89 | HR 84 | Temp 97.2°F | Ht 71.0 in | Wt 233.0 lb

## 2016-01-10 DIAGNOSIS — M10071 Idiopathic gout, right ankle and foot: Secondary | ICD-10-CM

## 2016-01-10 DIAGNOSIS — M109 Gout, unspecified: Secondary | ICD-10-CM

## 2016-01-10 MED ORDER — INDOMETHACIN 50 MG PO CAPS: 50.0000 mg | ORAL_CAPSULE | Freq: Two times a day (BID) | ORAL | 1 refills | Status: DC

## 2016-01-10 NOTE — Patient Instructions (Signed)
Low-Purine Diet  Purines are compounds that affect the level of uric acid in your body. A low-purine diet is a diet that is low in purines. Eating a low-purine diet can prevent the level of uric acid in your body from getting too high and causing gout or kidney stones or both.  WHAT DO I NEED TO KNOW ABOUT THIS DIET?  · Choose low-purine foods. Examples of low-purine foods are listed in the next section.  · Drink plenty of fluids, especially water. Fluids can help remove uric acid from your body. Try to drink 8-16 cups (1.9-3.8 L) a day.  · Limit foods high in fat, especially saturated fat, as fat makes it harder for the body to get rid of uric acid. Foods high in saturated fat include pizza, cheese, ice cream, whole milk, fried foods, and gravies. Choose foods that are lower in fat and lean sources of protein. Use olive oil when cooking as it contains healthy fats that are not high in saturated fat.  · Limit alcohol. Alcohol interferes with the elimination of uric acid from your body. If you are having a gout attack, avoid all alcohol.  · Keep in mind that different people's bodies react differently to different foods. You will probably learn over time which foods do or do not affect you. If you discover that a food tends to cause your gout to flare up, avoid eating that food. You can more freely enjoy foods that do not cause problems. If you have any questions about a food item, talk to your dietitian or health care provider.  WHICH FOODS ARE LOW, MODERATE, AND HIGH IN PURINES?  The following is a list of foods that are low, moderate, and high in purines. You can eat any amount of the foods that are low in purines. You may be able to have small amounts of foods that are moderate in purines. Ask your health care provider how much of a food moderate in purines you can have. Avoid foods high in purines.  Grains  · Foods low in purines: Enriched white bread, pasta, rice, cake, cornbread, popcorn.  · Foods moderate in  purines: Whole-grain breads and cereals, wheat germ, bran, oatmeal. Uncooked oatmeal. Dry wheat bran or wheat germ.  · Foods high in purines: Pancakes, French toast, biscuits, muffins.  Vegetables  · Foods low in purines: All vegetables, except those that are moderate in purines.  · Foods moderate in purines: Asparagus, cauliflower, spinach, mushrooms, green peas.  Fruits  · All fruits are low in purines.  Meats and other Protein Foods  · Foods low in purines: Eggs, nuts, peanut butter.  · Foods moderate in purines: 80-90% lean beef, lamb, veal, pork, poultry, fish, eggs, peanut butter, nuts. Crab, lobster, oysters, and shrimp. Cooked dried beans, peas, and lentils.  · Foods high in purines: Anchovies, sardines, herring, mussels, tuna, codfish, scallops, trout, and haddock. Bacon. Organ meats (such as liver or kidney). Tripe. Game meat. Goose. Sweetbreads.  Dairy  · All dairy foods are low in purines. Low-fat and fat-free dairy products are best because they are low in saturated fat.  Beverages  · Drinks low in purines: Water, carbonated beverages, tea, coffee, cocoa.  · Drinks moderate in purines: Soft drinks and other drinks sweetened with high-fructose corn syrup. Juices. To find whether a food or drink is sweetened with high-fructose corn syrup, look at the ingredients list.  · Drinks high in purines: Alcoholic beverages (such as beer).  Condiments  · Foods   low in purines: Salt, herbs, olives, pickles, relishes, vinegar.  · Foods moderate in purines: Butter, margarine, oils, mayonnaise.  Fats and Oils  · Foods low in purines: All types, except gravies and sauces made with meat.  · Foods high in purines: Gravies and sauces made with meat.  Other Foods  · Foods low in purines: Sugars, sweets, gelatin. Cake. Soups made without meat.  · Foods moderate in purines: Meat-based or fish-based soups, broths, or bouillons. Foods and drinks sweetened with high-fructose corn syrup.  · Foods high in purines: High-fat desserts  (such as ice cream, cookies, cakes, pies, doughnuts, and chocolate).  Contact your dietitian for more information on foods that are not listed here.     This information is not intended to replace advice given to you by your health care provider. Make sure you discuss any questions you have with your health care provider.     Document Released: 08/02/2010 Document Revised: 04/12/2013 Document Reviewed: 03/14/2013  Elsevier Interactive Patient Education ©2016 Else[employer] Inc.

## 2016-01-10 NOTE — Progress Notes (Signed)
   Subjective:    Patient ID: Scott HeidelbergWilliam K Axley, male    DOB: 08-13-1965, 50 y.o.   MRN: [id number]  HPI  Patient here today to discuss Gout. He had a flare up Tuesday of this week. Pain was in his right big toe. He took motrin which helped. He use to be on allopurinol daily but stopped taking about 2 years ago. He has flare ups 5-6 x a year. His diet really affects his flare ups.   Review of Systems  Constitutional: Negative.   HENT: Negative.   Respiratory: Negative.   Cardiovascular: Negative.   Gastrointestinal: Negative.   Genitourinary: Negative.   Neurological: Negative.   Psychiatric/Behavioral: Negative.   All other systems reviewed and are negative.      Objective:   Physical Exam  Constitutional: He is oriented to person, place, and time. He appears well-developed and well-nourished.  Cardiovascular: Normal rate, regular rhythm and normal heart sounds.   Pulmonary/Chest: Effort normal and breath sounds normal.  Musculoskeletal:  No erythema or pain in right great toe today.  Neurological: He is alert and oriented to person, place, and time.  Skin: Skin is warm.  Psychiatric: He has a normal mood and affect. His behavior is normal. Judgment and thought content normal.    BP 137/89   Pulse 84   Temp 97.2 F (36.2 C) (Oral)   Ht 5\' 11"  (1.803 m)   Wt 233 lb (105.7 kg)   BMI 32.50 kg/m        Assessment & Plan:   1. Acute gout of right foot, unspecified cause    Meds ordered this encounter  Medications  . indomethacin (INDOCIN) 50 MG capsule    Sig: Take 1 capsule (50 mg total) by mouth 2 (two) times daily with a meal.    Dispense:  30 capsule    Refill:  1    Order Specific Question:   Supervising Provider    Answer:   Oswaldo DoneVINCENT, CAROL L [[id number]]   Low purine diet RTO prn  Mary-Margaret Daphine DeutscherMartin, FNP

## 2016-01-28 ENCOUNTER — Encounter: Payer: Self-pay | Admitting: *Deleted

## 2016-02-04 DIAGNOSIS — D229 Melanocytic nevi, unspecified: Secondary | ICD-10-CM | POA: Diagnosis not present

## 2016-02-04 DIAGNOSIS — L821 Other seborrheic keratosis: Secondary | ICD-10-CM | POA: Diagnosis not present

## 2016-03-03 ENCOUNTER — Ambulatory Visit (INDEPENDENT_AMBULATORY_CARE_PROVIDER_SITE_OTHER): Payer: BLUE CROSS/BLUE SHIELD

## 2016-03-03 DIAGNOSIS — Z23 Encounter for immunization: Secondary | ICD-10-CM

## 2016-04-09 ENCOUNTER — Encounter: Payer: Self-pay | Admitting: Family Medicine

## 2016-04-09 ENCOUNTER — Telehealth: Payer: Self-pay | Admitting: Family Medicine

## 2016-04-09 ENCOUNTER — Ambulatory Visit (INDEPENDENT_AMBULATORY_CARE_PROVIDER_SITE_OTHER): Payer: BLUE CROSS/BLUE SHIELD | Admitting: Family Medicine

## 2016-04-09 VITALS — BP 118/79 | HR 96 | Temp 97.7°F | Ht 71.0 in | Wt 235.0 lb

## 2016-04-09 DIAGNOSIS — F330 Major depressive disorder, recurrent, mild: Secondary | ICD-10-CM | POA: Diagnosis not present

## 2016-04-09 DIAGNOSIS — F418 Other specified anxiety disorders: Secondary | ICD-10-CM

## 2016-04-09 DIAGNOSIS — J209 Acute bronchitis, unspecified: Secondary | ICD-10-CM | POA: Diagnosis not present

## 2016-04-09 DIAGNOSIS — R0989 Other specified symptoms and signs involving the circulatory and respiratory systems: Secondary | ICD-10-CM | POA: Diagnosis not present

## 2016-04-09 MED ORDER — AZITHROMYCIN 250 MG PO TABS: ORAL_TABLET | ORAL | 0 refills | Status: DC

## 2016-04-09 MED ORDER — VORTIOXETINE HBR 10 MG PO TABS: 1.0000 | ORAL_TABLET | Freq: Every day | ORAL | 4 refills | Status: DC

## 2016-04-09 NOTE — Telephone Encounter (Signed)
Let's go ahead with the samples first and then see how he tolerates those before we call a prescription in. It is good that the insurance will pay for this so we have to raise the dose we can go with insurance with a stronger dose.

## 2016-04-09 NOTE — Telephone Encounter (Signed)
Please advise 
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
## 2016-04-09 NOTE — Patient Instructions (Signed)
Take antibiotic as directed Start Trentellix 10 mg 1 daily----follow up in 4 weeks before running out of this strength of medicine Use Mucinex one twice daily with a large glass of water Use nasal saline frequently in each nostril Continue to use Flonase 1 spray each nostril

## 2016-04-10 NOTE — Telephone Encounter (Signed)
Pt notified to try samples first

## 2016-05-15 ENCOUNTER — Ambulatory Visit (INDEPENDENT_AMBULATORY_CARE_PROVIDER_SITE_OTHER): Payer: BLUE CROSS/BLUE SHIELD | Admitting: Family Medicine

## 2016-05-15 ENCOUNTER — Encounter: Payer: Self-pay | Admitting: Family Medicine

## 2016-05-15 ENCOUNTER — Ambulatory Visit: Payer: BLUE CROSS/BLUE SHIELD | Admitting: Family Medicine

## 2016-05-15 VITALS — BP 129/87 | HR 93 | Temp 97.6°F | Ht 71.0 in | Wt 238.0 lb

## 2016-05-15 DIAGNOSIS — M25579 Pain in unspecified ankle and joints of unspecified foot: Secondary | ICD-10-CM | POA: Diagnosis not present

## 2016-05-15 DIAGNOSIS — H10022 Other mucopurulent conjunctivitis, left eye: Secondary | ICD-10-CM | POA: Diagnosis not present

## 2016-05-15 DIAGNOSIS — E7800 Pure hypercholesterolemia, unspecified: Secondary | ICD-10-CM | POA: Diagnosis not present

## 2016-05-15 DIAGNOSIS — K219 Gastro-esophageal reflux disease without esophagitis: Secondary | ICD-10-CM

## 2016-05-15 DIAGNOSIS — M109 Gout, unspecified: Secondary | ICD-10-CM | POA: Diagnosis not present

## 2016-05-15 DIAGNOSIS — E559 Vitamin D deficiency, unspecified: Secondary | ICD-10-CM | POA: Diagnosis not present

## 2016-05-15 DIAGNOSIS — F418 Other specified anxiety disorders: Secondary | ICD-10-CM

## 2016-05-15 MED ORDER — FLUOCINONIDE-E 0.05 % EX CREA: 1.0000 "application " | TOPICAL_CREAM | Freq: Two times a day (BID) | CUTANEOUS | 2 refills | Status: DC

## 2016-05-15 MED ORDER — VORTIOXETINE HBR 20 MG PO TABS: 20.0000 mg | ORAL_TABLET | Freq: Every day | ORAL | 3 refills | Status: DC

## 2016-05-15 MED ORDER — POLYMYXIN B-TRIMETHOPRIM 10000-0.1 UNIT/ML-% OP SOLN: 1.0000 [drp] | Freq: Three times a day (TID) | OPHTHALMIC | 0 refills | Status: DC

## 2016-05-15 NOTE — Progress Notes (Signed)
Subjective:    Patient ID: Scott Rasmussen, male    DOB: 02/29/1966, 51 y.o.   MRN: [id number]  HPI Pt here for follow up and management of chronic medical problems which includes hyperlipidemia. He is taking medication regularly.Patient today complains of some left eye soreness and some right great toe pain worried about gout. He is due to get lab work today and will be given an FOBT to return. The left lower eye lid has been bothering him for several days and has had some slight swelling and redness. He says that the trintellix has helped him some but he feels like he needs to go up on the dose. He has not been getting any counseling yet he is still in his marriage. He is not happy. I encouraged him to go back and get with his counselor. He also has continued has some trouble with what he thinks is gout in his toes and he wants to stay away from allopurinol. We will not start him on anything until we check a uric acid. The patient denies any chest pain or shortness of breath. He denies any problems with his stomach intestinal track or with passing his water.    Patient Active Problem List   Diagnosis Date Noted  . Thoracic aortic atherosclerosis ([place]) 12/05/2015  . Depression 06/01/2015  . Allergic rhinitis 08/15/2014  . Abnormal liver function test 11/08/2013  . Obesity (BMI 30.0-34.9) 01/03/2013  . Gout 11/26/2012  . Hyperlipidemia 11/26/2012  . GERD (gastroesophageal reflux disease) 11/26/2012   Outpatient Encounter Prescriptions as of 05/15/2016  Medication Sig  . fenofibrate (TRICOR) 145 MG tablet Take 1 tablet (145 mg total) by mouth daily.  . fluocinonide-emollient (LIDEX-E) 0.05 % cream Apply 1 application topically 2 (two) times daily.  . fluticasone (FLONASE) 50 MCG/ACT nasal spray Place 2 sprays into both nostrils daily.  . indomethacin (INDOCIN) 50 MG capsule Take 1 capsule (50 mg total) by mouth 2 (two) times daily with a meal.  . loratadine (CLARITIN) 10 MG tablet Take 1  tablet (10 mg total) by mouth daily.  Marland Kitchen omega-3 acid ethyl esters (LOVAZA) 1 g capsule Take 2 capsules (2 g total) by mouth 2 (two) times daily.  Marland Kitchen omeprazole (PRILOSEC) 40 MG capsule Take 1 capsule (40 mg total) by mouth daily.  Marland Kitchen vortioxetine HBr (TRINTELLIX) 10 MG TABS Take 1 tablet (10 mg total) by mouth daily.  . [DISCONTINUED] azithromycin (ZITHROMAX) 250 MG tablet As directed   No facility-administered encounter medications on file as of 05/15/2016.        Review of Systems  Constitutional: Negative.   HENT: Positive for congestion.   Eyes: Negative.        Left eye sore  Respiratory: Negative.   Cardiovascular: Negative.   Gastrointestinal: Negative.   Endocrine: Negative.   Genitourinary: Negative.   Musculoskeletal: Positive for arthralgias (pain in right toe).  Skin: Negative.   Allergic/Immunologic: Negative.   Neurological: Negative.   Hematological: Negative.   Psychiatric/Behavioral: Negative.        Objective:   Physical Exam  Constitutional: He is oriented to person, place, and time. He appears well-developed and well-nourished.  The patient is calm and relaxed today. It seems to be less stressed than previously.  HENT:  Head: Normocephalic and atraumatic.  Right Ear: External ear normal.  Left Ear: External ear normal.  Nose: Nose normal.  Mouth/Throat: Oropharynx is clear and moist. No oropharyngeal exudate.  Eyes: Conjunctivae and EOM are normal. Pupils are  equal, round, and reactive to light. Right eye exhibits no discharge. Left eye exhibits no discharge. No scleral icterus.  Mild conjunctivitis left lower lid of left eye  Neck: Normal range of motion. Neck supple. No thyromegaly present.  Bruits thyromegaly or anterior cervical adenopathy are not present  Cardiovascular: Normal rate, regular rhythm, normal heart sounds and intact distal pulses.   No murmur heard. Pulmonary/Chest: Effort normal and breath sounds normal. No respiratory distress. He  has no wheezes. He has no rales. He exhibits no tenderness.  Clear anteriorly and posteriorly and no axillary adenopathy  Abdominal: Soft. Bowel sounds are normal. He exhibits no mass. There is no tenderness. There is no rebound and no guarding.  No abdominal tenderness masses or organ enlargement  Musculoskeletal: Normal range of motion. He exhibits no edema or tenderness.  There is no swelling of any of the toes and no tenderness to palpation. We will do a uric acid to see what his level is.  Lymphadenopathy:    He has no cervical adenopathy.  Neurological: He is alert and oriented to person, place, and time. He has normal reflexes. No cranial nerve deficit.  Skin: Skin is warm and dry. No rash noted.  Psychiatric: He has a normal mood and affect. His behavior is normal. Judgment and thought content normal.  The patient's mood and affect seem to be improved from previously. We will increase his antidepressant medicine to the higher dose of 20 mg.  Nursing note and vitals reviewed.   BP 129/87 (BP Location: Left Arm)   Pulse 93   Temp 97.6 F (36.4 C) (Oral)   Ht 5' 11" (1.803 m)   Wt 238 lb (108 kg)   BMI 33.19 kg/m        Assessment & Plan:  1. Pure hypercholesterolemia -Continue with fenofibrate and as aggressive therapeutic lifestyle changes as possible - CBC with Differential/Platelet - BMP8+EGFR - Hepatic function panel - Lipid panel  2. Gastroesophageal reflux disease, esophagitis presence not specified -Continue with omeprazole and dietary management - CBC with Differential/Platelet  3. Vitamin D deficiency -Continue with vitamin D replacement pending results of lab work - CBC with Differential/Platelet - VITAMIN D 25 Hydroxy (Vit-D Deficiency, Fractures)  4. Gout involving toe of right foot, unspecified cause, unspecified chronicity -Check uric acid and then make a determination if treatment is needed like uloric - CBC with Differential/Platelet - Uric  acid  5. Pain in joint involving ankle and foot, unspecified laterality -Uric acid  6. Other mucopurulent conjunctivitis of left eye -Polytrim ophthalmic drops  7. Situational anxiety -Increase trintellix to 20 mg -Get counseling advice regarding marriage and unhappiness -Please call us back if he would like for [place] to make an appointment with a psychiatrist for him  Meds ordered this encounter  Medications  . fluocinonide-emollient (LIDEX-E) 0.05 % cream    Sig: Apply 1 application topically 2 (two) times daily.    Dispense:  30 g    Refill:  2  . vortioxetine HBr (TRINTELLIX) 20 MG TABS    Sig: Take 20 mg by mouth daily.    Dispense:  90 tablet    Refill:  3  . trimethoprim-polymyxin b (POLYTRIM) ophthalmic solution    Sig: Place 1-2 drops into the left eye 3 (three) times daily. For 7 days    Dispense:  10 mL    Refill:  0   Patient Instructions  Continue current medications. Continue good therapeutic lifestyle changes which include good diet and exercise.  Fall precautions discussed with patient. If an FOBT was given today- please return it to our front desk. If you are over 110 years old - you may need Prevnar 59 or the adult Pneumonia vaccine.  **Flu shots are available--- please call and schedule a FLU-CLINIC appointment**  After your visit with [place] today you will receive a survey in the mail or online from [employer] regarding your care with [place]. Please take a moment to fill this out. Your feedback is very important to [place] as you can help [place] better understand your patient needs as well as improve your experience and satisfaction. WE CARE ABOUT YOU!!!   Please try to schedule a therapy session with your counselor We will call you with the lab work results as soon as they become available Use the Polytrim eyedrops as prescribed for the left eye if the uric acid is greater than 6 and since she had been having problems we will consider starting ULoric We will increase the  antidepressant medicine to 20 mg. Please let us know if you would like for [place] to schedule a visit with a psychiatrist and we will be glad to do this for you.   Arrie Senate MD

## 2016-05-15 NOTE — Patient Instructions (Addendum)
Continue current medications. Continue good therapeutic lifestyle changes which include good diet and exercise. Fall precautions discussed with patient. If an FOBT was given today- please return it to our front desk. If you are over 185 years old - you may need Prevnar 13 or the adult Pneumonia vaccine.  **Flu shots are available--- please call and schedule a FLU-CLINIC appointment**  After your visit with us today you will receive a survey in the mail or online from [employer] regarding your care with us. Please take a moment to fill this out. Your feedback is very important to us as you can help us better understand your patient needs as well as improve your experience and satisfaction. WE CARE ABOUT YOU!!!   Please try to schedule a therapy session with your counselor We will call you with the lab work results as soon as they become available Use the Polytrim eyedrops as prescribed for the left eye if the uric acid is greater than 6 and since she had been having problems we will consider starting ULoric We will increase the antidepressant medicine to 20 mg. Please let us know if you would like for us to schedule a visit with a psychiatrist and we will be glad to do this for you.

## 2016-05-16 ENCOUNTER — Telehealth: Payer: Self-pay | Admitting: Family Medicine

## 2016-05-16 ENCOUNTER — Other Ambulatory Visit: Payer: Self-pay | Admitting: *Deleted

## 2016-05-16 LAB — LIPID PANEL
Chol/HDL Ratio: 5.3 ratio units — ABNORMAL HIGH (ref 0.0–5.0)
Cholesterol, Total: 196 mg/dL (ref 100–199)
HDL: 37 mg/dL — ABNORMAL LOW (ref >39)
LDL CALC: 105 mg/dL — AB (ref 0–99)
TRIGLYCERIDES: 272 mg/dL — AB (ref 0–149)
VLDL Cholesterol Cal: 54 mg/dL — ABNORMAL HIGH (ref 5–40)

## 2016-05-16 LAB — CBC WITH DIFFERENTIAL/PLATELET
BASOS ABS: 0.1 10*3/uL (ref 0.0–0.2)
Basos: 1 %
EOS (ABSOLUTE): 0.3 10*3/uL (ref 0.0–0.4)
Eos: 4 %
Hematocrit: 48.6 % (ref 37.5–51.0)
Hemoglobin: 16.9 g/dL (ref 13.0–17.7)
Immature Grans (Abs): 0 10*3/uL (ref 0.0–0.1)
Immature Granulocytes: 0 %
LYMPHS ABS: 2.3 10*3/uL (ref 0.7–3.1)
LYMPHS: 33 %
MCH: 28.7 pg (ref 26.6–33.0)
MCHC: 34.8 g/dL (ref 31.5–35.7)
MCV: 83 fL (ref 79–97)
MONOCYTES: 8 %
Monocytes Absolute: 0.6 10*3/uL (ref 0.1–0.9)
NEUTROS ABS: 4 10*3/uL (ref 1.4–7.0)
Neutrophils: 54 %
PLATELETS: 317 10*3/uL (ref 150–379)
RBC: 5.89 x10E6/uL — AB (ref 4.14–5.80)
RDW: 13.4 % (ref 12.3–15.4)
WBC: 7.2 10*3/uL (ref 3.4–10.8)

## 2016-05-16 LAB — BMP8+EGFR
BUN / CREAT RATIO: 16 (ref 9–20)
BUN: 18 mg/dL (ref 6–24)
CHLORIDE: 102 mmol/L (ref 96–106)
CO2: 23 mmol/L (ref 18–29)
Calcium: 9.8 mg/dL (ref 8.7–10.2)
Creatinine, Ser: 1.13 mg/dL (ref 0.76–1.27)
GFR calc Af Amer: 88 mL/min/{1.73_m2} (ref >59)
GFR calc non Af Amer: 76 mL/min/{1.73_m2} (ref >59)
GLUCOSE: 81 mg/dL (ref 65–99)
POTASSIUM: 4.3 mmol/L (ref 3.5–5.2)
SODIUM: 142 mmol/L (ref 134–144)

## 2016-05-16 LAB — HEPATIC FUNCTION PANEL
ALBUMIN: 4.8 g/dL (ref 3.5–5.5)
ALK PHOS: 69 IU/L (ref 39–117)
ALT: 37 IU/L (ref 0–44)
AST: 24 IU/L (ref 0–40)
Bilirubin Total: 0.2 mg/dL (ref 0.0–1.2)
Bilirubin, Direct: 0.07 mg/dL (ref 0.00–0.40)
Total Protein: 7.3 g/dL (ref 6.0–8.5)

## 2016-05-16 LAB — VITAMIN D 25 HYDROXY (VIT D DEFICIENCY, FRACTURES): VIT D 25 HYDROXY: 32.1 ng/mL (ref 30.0–100.0)

## 2016-05-16 LAB — URIC ACID: Uric Acid: 8.4 mg/dL (ref 3.7–8.6)

## 2016-05-16 MED ORDER — FEBUXOSTAT 40 MG PO TABS: 40.0000 mg | ORAL_TABLET | Freq: Every day | ORAL | 3 refills | Status: DC

## 2016-05-16 NOTE — Telephone Encounter (Signed)
Please authorize this is patient has tried other antidepressants and they did not work.

## 2016-05-16 NOTE — Telephone Encounter (Signed)
Please authorize this as patient has tried other antidepressants and they were not successful.

## 2016-05-19 ENCOUNTER — Telehealth: Payer: Self-pay | Admitting: Family Medicine

## 2016-05-19 NOTE — Telephone Encounter (Signed)
[facility] need [facility]

## 2016-05-21 ENCOUNTER — Other Ambulatory Visit: Payer: BLUE CROSS/BLUE SHIELD

## 2016-05-21 ENCOUNTER — Telehealth: Payer: Self-pay | Admitting: Physician Assistant

## 2016-05-21 DIAGNOSIS — Z1211 Encounter for screening for malignant neoplasm of colon: Secondary | ICD-10-CM

## 2016-05-21 MED ORDER — OSELTAMIVIR PHOSPHATE 75 MG PO CAPS: 75.0000 mg | ORAL_CAPSULE | Freq: Every day | ORAL | 0 refills | Status: AC

## 2016-05-21 MED ORDER — OSELTAMIVIR PHOSPHATE 75 MG PO CAPS: 75.0000 mg | ORAL_CAPSULE | Freq: Every day | ORAL | 0 refills | Status: DC

## 2016-05-21 NOTE — Telephone Encounter (Signed)
Duplicate message. 

## 2016-05-21 NOTE — Telephone Encounter (Signed)
Please advise if this was done.

## 2016-05-21 NOTE — Telephone Encounter (Signed)
exposure to influenza Prescription sent to pharmacy   

## 2016-05-22 LAB — FECAL OCCULT BLOOD, IMMUNOCHEMICAL: FECAL OCCULT BLD: NEGATIVE

## 2016-05-27 NOTE — Telephone Encounter (Signed)
This was authorized.

## 2016-06-13 ENCOUNTER — Ambulatory Visit: Payer: BLUE CROSS/BLUE SHIELD | Admitting: Family Medicine

## 2016-06-19 DIAGNOSIS — M4686 Other specified inflammatory spondylopathies, lumbar region: Secondary | ICD-10-CM | POA: Diagnosis not present

## 2016-06-19 DIAGNOSIS — M25551 Pain in right hip: Secondary | ICD-10-CM | POA: Diagnosis not present

## 2016-06-20 DIAGNOSIS — M545 Low back pain: Secondary | ICD-10-CM | POA: Diagnosis not present

## 2016-06-23 DIAGNOSIS — M545 Low back pain: Secondary | ICD-10-CM | POA: Diagnosis not present

## 2016-06-26 ENCOUNTER — Ambulatory Visit: Payer: BLUE CROSS/BLUE SHIELD | Admitting: Family Medicine

## 2016-06-27 DIAGNOSIS — M545 Low back pain: Secondary | ICD-10-CM | POA: Diagnosis not present

## 2016-06-30 DIAGNOSIS — M545 Low back pain: Secondary | ICD-10-CM | POA: Diagnosis not present

## 2016-07-03 DIAGNOSIS — M545 Low back pain: Secondary | ICD-10-CM | POA: Diagnosis not present

## 2016-07-07 DIAGNOSIS — M545 Low back pain: Secondary | ICD-10-CM | POA: Diagnosis not present

## 2016-07-11 DIAGNOSIS — M545 Low back pain: Secondary | ICD-10-CM | POA: Diagnosis not present

## 2016-09-29 ENCOUNTER — Telehealth: Payer: Self-pay | Admitting: *Deleted

## 2016-09-29 NOTE — Telephone Encounter (Signed)
Doxycycline 100 mg twice daily with food for 10 days

## 2016-09-29 NOTE — Telephone Encounter (Signed)
Has a history of "sweat knots around the back of his hat line" wants to know if we can send a rx in  [facility]

## 2016-09-30 MED ORDER — DOXYCYCLINE HYCLATE 100 MG PO TABS: 100.0000 mg | ORAL_TABLET | Freq: Two times a day (BID) | ORAL | 0 refills | Status: DC

## 2016-09-30 NOTE — Telephone Encounter (Signed)
Rx ordered and sent in pt notified

## 2016-10-15 ENCOUNTER — Encounter: Payer: Self-pay | Admitting: Family Medicine

## 2016-10-15 ENCOUNTER — Ambulatory Visit: Payer: BLUE CROSS/BLUE SHIELD | Admitting: Family Medicine

## 2016-10-15 ENCOUNTER — Ambulatory Visit (INDEPENDENT_AMBULATORY_CARE_PROVIDER_SITE_OTHER): Payer: BLUE CROSS/BLUE SHIELD | Admitting: Family Medicine

## 2016-10-15 ENCOUNTER — Other Ambulatory Visit: Payer: Self-pay | Admitting: Family Medicine

## 2016-10-15 ENCOUNTER — Ambulatory Visit (INDEPENDENT_AMBULATORY_CARE_PROVIDER_SITE_OTHER): Payer: BLUE CROSS/BLUE SHIELD

## 2016-10-15 VITALS — BP 149/89 | HR 90 | Temp 97.4°F | Ht 71.0 in | Wt 242.0 lb

## 2016-10-15 DIAGNOSIS — Z6833 Body mass index (BMI) 33.0-33.9, adult: Secondary | ICD-10-CM

## 2016-10-15 DIAGNOSIS — I700 Atherosclerosis of aorta: Secondary | ICD-10-CM

## 2016-10-15 DIAGNOSIS — E349 Endocrine disorder, unspecified: Secondary | ICD-10-CM

## 2016-10-15 DIAGNOSIS — E559 Vitamin D deficiency, unspecified: Secondary | ICD-10-CM

## 2016-10-15 DIAGNOSIS — N400 Enlarged prostate without lower urinary tract symptoms: Secondary | ICD-10-CM

## 2016-10-15 DIAGNOSIS — Z0000 Encounter for general adult medical examination without abnormal findings: Secondary | ICD-10-CM

## 2016-10-15 DIAGNOSIS — E7800 Pure hypercholesterolemia, unspecified: Secondary | ICD-10-CM

## 2016-10-15 DIAGNOSIS — K219 Gastro-esophageal reflux disease without esophagitis: Secondary | ICD-10-CM

## 2016-10-15 DIAGNOSIS — E291 Testicular hypofunction: Secondary | ICD-10-CM

## 2016-10-15 DIAGNOSIS — R030 Elevated blood-pressure reading, without diagnosis of hypertension: Secondary | ICD-10-CM

## 2016-10-15 DIAGNOSIS — F418 Other specified anxiety disorders: Secondary | ICD-10-CM

## 2016-10-15 DIAGNOSIS — R0683 Snoring: Secondary | ICD-10-CM

## 2016-10-15 DIAGNOSIS — R5383 Other fatigue: Secondary | ICD-10-CM

## 2016-10-15 LAB — URINALYSIS
BILIRUBIN UA: NEGATIVE
GLUCOSE, UA: NEGATIVE
Leukocytes, UA: NEGATIVE
Nitrite, UA: NEGATIVE
PROTEIN UA: NEGATIVE
Specific Gravity, UA: 1.02 (ref 1.005–1.030)
UUROB: 0.2 mg/dL (ref 0.2–1.0)
pH, UA: 7 (ref 5.0–7.5)

## 2016-10-15 MED ORDER — DOXYCYCLINE HYCLATE 100 MG PO TABS: 100.0000 mg | ORAL_TABLET | Freq: Two times a day (BID) | ORAL | 2 refills | Status: DC

## 2016-10-15 MED ORDER — FLUTICASONE PROPIONATE 50 MCG/ACT NA SUSP: 2.0000 | Freq: Every day | NASAL | 3 refills | Status: DC

## 2016-10-15 MED ORDER — FLUOCINONIDE-E 0.05 % EX CREA: 1.0000 "application " | TOPICAL_CREAM | Freq: Two times a day (BID) | CUTANEOUS | 3 refills | Status: DC

## 2016-10-15 MED ORDER — LORATADINE 10 MG PO TABS: 10.0000 mg | ORAL_TABLET | Freq: Every day | ORAL | 3 refills | Status: DC

## 2016-10-15 NOTE — Patient Instructions (Addendum)
Continue current medications. Continue good therapeutic lifestyle changes which include good diet and exercise. Fall precautions discussed with patient. If an FOBT was given today- please return it to our front desk. If you are over 49 years old - you may need Prevnar 13 or the adult Pneumonia vaccine.  **Flu shots are available--- please call and schedule a FLU-CLINIC appointment**  After your visit with us today you will receive a survey in the mail or online from [employer] regarding your care with us. Please take a moment to fill this out. Your feedback is very important to us as you can help us better understand your patient needs as well as improve your experience and satisfaction. WE CARE ABOUT YOU!!!   We will arrange for you to have an evaluation [facility] sleep ap[facility] Bring blood pressure readings back when you get your blood work done on Friday and have the nurse check her blood pressure here Watch sodium intake Drink more water

## 2016-10-15 NOTE — Progress Notes (Signed)
Subjective:    Patient ID: Scott Rasmussen, male    DOB: May 06, 1965, 51 y.o.   MRN: [id number]  HPI Patient is here today for annual wellness exam and follow up of chronic medical problems which includes hyperlipidemia and GERD. He is taking medication regularly.The patient comes in for his annual physical exam and does complain of a cyst in the groin area. Also complains of sinus congestion. His blood pressure was elevated on both initial readings today. He will return to the office on Friday for lab work. He will get a chest x-ray today and get his PSA prostate checked. The patient is concerned that he may still have sleep apnea and this is why he feels so tired and fatigued during the day. Previously he did have surgery for sleep apnea and had the uvula removed. He did not snore then after this happened. He is now snoring again and no one can be around him or in the room with him at night. The patient denies any chest pain or shortness of breath. He denies any trouble with swallowing heartburn indigestion nausea vomiting diarrhea or blood in the stool. He's passing his water without problems and having no sexual dysfunction. He has had some low testosterone levels in the past but sexual function is good so we will not push that anymore on him currently. He does have a lot of allergy related issues and is requesting refills on his allergy medicines. He has not been checking his blood pressures at home recently but will try to check them more frequently. He is not taking his antidepressant as he felt like this made him more sleepy. His biggest concern and complaint remains his daytime sleepiness and tiredness feeling.   Patient Active Problem List   Diagnosis Date Noted  . Thoracic aortic atherosclerosis ([place]) 12/05/2015  . Depression 06/01/2015  . Allergic rhinitis 08/15/2014  . Abnormal liver function test 11/08/2013  . Obesity (BMI 30.0-34.9) 01/03/2013  . Gout 11/26/2012  . Hyperlipidemia  11/26/2012  . GERD (gastroesophageal reflux disease) 11/26/2012   Outpatient Encounter Prescriptions as of 10/15/2016  Medication Sig  . febuxostat (ULORIC) 40 MG tablet Take 1 tablet (40 mg total) by mouth daily.  . fenofibrate (TRICOR) 145 MG tablet Take 1 tablet (145 mg total) by mouth daily.  . fluocinonide-emollient (LIDEX-E) 0.05 % cream Apply 1 application topically 2 (two) times daily.  . fluticasone (FLONASE) 50 MCG/ACT nasal spray Place 2 sprays into both nostrils daily.  Marland Kitchen loratadine (CLARITIN) 10 MG tablet Take 1 tablet (10 mg total) by mouth daily.  Marland Kitchen omega-3 acid ethyl esters (LOVAZA) 1 g capsule Take 2 capsules (2 g total) by mouth 2 (two) times daily.  Marland Kitchen omeprazole (PRILOSEC) 40 MG capsule Take 1 capsule (40 mg total) by mouth daily.  Marland Kitchen trimethoprim-polymyxin b (POLYTRIM) ophthalmic solution Place 1-2 drops into the left eye 3 (three) times daily. For 7 days  . [DISCONTINUED] doxycycline (VIBRA-TABS) 100 MG tablet Take 1 tablet (100 mg total) by mouth 2 (two) times daily. With food (Patient not taking: Reported on 10/15/2016)  . [DISCONTINUED] indomethacin (INDOCIN) 50 MG capsule Take 1 capsule (50 mg total) by mouth 2 (two) times daily with a meal.  . [DISCONTINUED] vortioxetine HBr (TRINTELLIX) 20 MG TABS Take 20 mg by mouth daily.   No facility-administered encounter medications on file as of 10/15/2016.       Review of Systems  Constitutional: Negative.   HENT: Positive for sinus pressure.   Eyes: Negative.  Respiratory: Negative.   Cardiovascular: Negative.   Gastrointestinal: Negative.   Endocrine: Negative.   Genitourinary: Negative.        Cyst - groin area  Musculoskeletal: Positive for arthralgias.  Skin: Negative.   Allergic/Immunologic: Negative.   Neurological: Negative.   Hematological: Negative.   Psychiatric/Behavioral: Negative.        Objective:   Physical Exam  Constitutional: He is oriented to person, place, and time. He appears  well-developed and well-nourished. No distress.  The patient is pleasant and relaxed his blood pressure were checked one more time and the reading was 141/92 in the left arm with a large cuff  HENT:  Head: Normocephalic and atraumatic.  Right Ear: External ear normal.  Left Ear: External ear normal.  Mouth/Throat: Oropharynx is clear and moist. No oropharyngeal exudate.  Nasal congestion and turbinate swelling left greater than right  Eyes: Conjunctivae and EOM are normal. Pupils are equal, round, and reactive to light. Right eye exhibits no discharge. Left eye exhibits no discharge. No scleral icterus.  Neck: Normal range of motion. Neck supple. No thyromegaly present.  No bruits thyromegaly or anterior cervical adenopathy  Cardiovascular: Normal rate, regular rhythm, normal heart sounds and intact distal pulses.   No murmur heard. Heart had a regular rate and rhythm at 84/m  Pulmonary/Chest: Effort normal and breath sounds normal. No respiratory distress. He has no wheezes. He has no rales. He exhibits no tenderness.  No axillary adenopathy no chest wall masses and good breath sounds anteriorly and posteriorly  Abdominal: Soft. Bowel sounds are normal. He exhibits no mass. There is no tenderness. There is no rebound and no guarding.  Mild obesity without liver or spleen enlargement masses or inguinal adenopathy  Genitourinary: Rectum normal and penis normal.  Genitourinary Comments: The prostate was minimally in largest with no rectal masses and there were no inguinal hernias palpated and the external genitalia were within normal limits  Musculoskeletal: Normal range of motion. He exhibits no edema.  Lymphadenopathy:    He has no cervical adenopathy.  Neurological: He is alert and oriented to person, place, and time. He has normal reflexes. No cranial nerve deficit.  Skin: Skin is warm and dry. No rash noted.  There were 2 small areas which appear to be healed perineal cyst and currently  there were not inflamed or irritated. We will continue to monitor these.  Psychiatric: He has a normal mood and affect. His behavior is normal. Judgment and thought content normal.  Nursing note and vitals reviewed.  BP (!) 146/98 (BP Location: Left Arm)   Pulse 90   Temp 97.4 F (36.3 C) (Oral)   Ht '5\' 11"'  (1.803 m)   Wt 242 lb (109.8 kg)   BMI 33.75 kg/m         Assessment & Plan:  1. Annual physical exam -We'll schedule patient for sleep apnea evaluation -He will be 51 years old in January 2019 and we will arrange for him to have a colonoscopy sometime after his birthday. - CBC with Differential/Platelet; Future - BMP8+EGFR; Future - Hepatic function panel; Future - VITAMIN D 25 Hydroxy (Vit-D Deficiency, Fractures); Future - NMR, lipoprofile; Future - PSA, total and free; Future - Testosterone,Free and Total; Future - Urinalysis, Complete - DG Chest 2 View; Future  2. Pure hypercholesterolemia -Continue with aggressive therapeutic lifestyle changes and current fenofibrate pending results of lab work - CBC with Differential/Platelet; Future - NMR, lipoprofile; Future - DG Chest 2 View; Future  3. Gastroesophageal  reflux disease, esophagitis presence not specified -No complaints today with reflux disease - CBC with Differential/Platelet; Future - Hepatic function panel; Future  4. Vitamin D deficiency -Continue vitamin D replacement pending results of lab work - CBC with Differential/Platelet; Future - VITAMIN D 25 Hydroxy (Vit-D Deficiency, Fractures); Future  5. Hypogonadism male -The patient does complain of fatigue but does not complain of erectile dysfunction. - CBC with Differential/Platelet; Future  6. Testosterone deficiency -The patient has had low testosterone levels in the past but has not had any positive results with taking testosterone replacement for fatigue. - CBC with Differential/Platelet; Future - PSA, total and free; Future -  Testosterone,Free and Total; Future - Urinalysis, Complete  7. Benign prostatic hyperplasia, unspecified whether lower urinary tract symptoms present - CBC with Differential/Platelet; Future - PSA, total and free; Future - Testosterone,Free and Total; Future - Urinalysis, Complete  8. Situational anxiety -He has stopped his antidepressant because he felt like this made him more sleepy.  9. Thoracic aortic atherosclerosis ([place]) -Continue with aggressive therapeutic lifestyle changes  10. BMI 33.0-33.9,adult -Continue to make all efforts at working on weight with diet and exercise  11. Elevated blood-pressure reading without diagnosis of hypertension -Watch sodium intake check readings at home and bring readings to the office with blood draw and have nurse recheck blood pressure at that time  Meds ordered this encounter  Medications  . loratadine (CLARITIN) 10 MG tablet    Sig: Take 1 tablet (10 mg total) by mouth daily.    Dispense:  90 tablet    Refill:  3  . fluticasone (FLONASE) 50 MCG/ACT nasal spray    Sig: Place 2 sprays into both nostrils daily.    Dispense:  48 g    Refill:  3  . fluocinonide-emollient (LIDEX-E) 0.05 % cream    Sig: Apply 1 application topically 2 (two) times daily.    Dispense:  90 g    Refill:  3   Patient Instructions  Continue current medications. Continue good therapeutic lifestyle changes which include good diet and exercise. Fall precautions discussed with patient. If an FOBT was given today- please return it to our front desk. If you are over 72 years old - you may need Prevnar 77 or the adult Pneumonia vaccine.  **Flu shots are available--- please call and schedule a FLU-CLINIC appointment**  After your visit with [place] today you will receive a survey in the mail or online from [employer] regarding your care with [place]. Please take a moment to fill this out. Your feedback is very important to [place] as you can help [place] better understand your patient  needs as well as improve your experience and satisfaction. WE CARE ABOUT YOU!!!   We will arrange for you to have an evaluation for sleep apnea with a pulmonologist in [facility] blood pressure readings back when you get your blood work done on Friday and have the nurse check her blood pressure here Watch sodium intake Drink more water   Arrie Senate MD

## 2016-10-17 ENCOUNTER — Other Ambulatory Visit: Payer: BLUE CROSS/BLUE SHIELD

## 2016-10-17 DIAGNOSIS — N400 Enlarged prostate without lower urinary tract symptoms: Secondary | ICD-10-CM | POA: Diagnosis not present

## 2016-10-17 DIAGNOSIS — E559 Vitamin D deficiency, unspecified: Secondary | ICD-10-CM | POA: Diagnosis not present

## 2016-10-17 DIAGNOSIS — E349 Endocrine disorder, unspecified: Secondary | ICD-10-CM

## 2016-10-17 DIAGNOSIS — Z0000 Encounter for general adult medical examination without abnormal findings: Secondary | ICD-10-CM | POA: Diagnosis not present

## 2016-10-17 DIAGNOSIS — K219 Gastro-esophageal reflux disease without esophagitis: Secondary | ICD-10-CM | POA: Diagnosis not present

## 2016-10-17 DIAGNOSIS — E7800 Pure hypercholesterolemia, unspecified: Secondary | ICD-10-CM | POA: Diagnosis not present

## 2016-10-17 DIAGNOSIS — E291 Testicular hypofunction: Secondary | ICD-10-CM | POA: Diagnosis not present

## 2016-10-18 LAB — CBC WITH DIFFERENTIAL/PLATELET
Basophils Absolute: 0.1 10*3/uL (ref 0.0–0.2)
Basos: 2 %
EOS (ABSOLUTE): 0.2 10*3/uL (ref 0.0–0.4)
EOS: 5 %
HEMATOCRIT: 50.2 % (ref 37.5–51.0)
HEMOGLOBIN: 17.2 g/dL (ref 13.0–17.7)
Immature Grans (Abs): 0 10*3/uL (ref 0.0–0.1)
Immature Granulocytes: 0 %
LYMPHS ABS: 1.8 10*3/uL (ref 0.7–3.1)
Lymphs: 34 %
MCH: 29.5 pg (ref 26.6–33.0)
MCHC: 34.3 g/dL (ref 31.5–35.7)
MCV: 86 fL (ref 79–97)
MONOCYTES: 7 %
Monocytes Absolute: 0.4 10*3/uL (ref 0.1–0.9)
Neutrophils Absolute: 2.8 10*3/uL (ref 1.4–7.0)
Neutrophils: 52 %
Platelets: 279 10*3/uL (ref 150–379)
RBC: 5.83 x10E6/uL — AB (ref 4.14–5.80)
RDW: 13.8 % (ref 12.3–15.4)
WBC: 5.4 10*3/uL (ref 3.4–10.8)

## 2016-10-18 LAB — BMP8+EGFR
BUN/Creatinine Ratio: 16 (ref 9–20)
BUN: 15 mg/dL (ref 6–24)
CALCIUM: 9.9 mg/dL (ref 8.7–10.2)
CO2: 21 mmol/L (ref 20–29)
CREATININE: 0.95 mg/dL (ref 0.76–1.27)
Chloride: 104 mmol/L (ref 96–106)
GFR calc Af Amer: 108 mL/min/{1.73_m2} (ref >59)
GFR, EST NON AFRICAN AMERICAN: 94 mL/min/{1.73_m2} (ref >59)
Glucose: 84 mg/dL (ref 65–99)
Potassium: 4.3 mmol/L (ref 3.5–5.2)
SODIUM: 142 mmol/L (ref 134–144)

## 2016-10-18 LAB — HEPATIC FUNCTION PANEL
ALBUMIN: 4.7 g/dL (ref 3.5–5.5)
ALK PHOS: 72 IU/L (ref 39–117)
ALT: 41 IU/L (ref 0–44)
AST: 29 IU/L (ref 0–40)
BILIRUBIN TOTAL: 0.5 mg/dL (ref 0.0–1.2)
BILIRUBIN, DIRECT: 0.13 mg/dL (ref 0.00–0.40)
Total Protein: 6.8 g/dL (ref 6.0–8.5)

## 2016-10-18 LAB — NMR, LIPOPROFILE
CHOLESTEROL: 170 mg/dL (ref 100–199)
HDL Cholesterol by NMR: 39 mg/dL — ABNORMAL LOW (ref >39)
HDL PARTICLE NUMBER: 33.5 umol/L (ref >=30.5)
LDL Particle Number: 1572 nmol/L — ABNORMAL HIGH (ref <1000)
LDL SIZE: 20.4 nm (ref >20.5)
LDL-C: 83 mg/dL (ref 0–99)
LP-IR SCORE: 100 — AB (ref <=45)
SMALL LDL PARTICLE NUMBER: 915 nmol/L — AB (ref <=527)
Triglycerides by NMR: 242 mg/dL — ABNORMAL HIGH (ref 0–149)

## 2016-10-18 LAB — VITAMIN D 25 HYDROXY (VIT D DEFICIENCY, FRACTURES): Vit D, 25-Hydroxy: 36.1 ng/mL (ref 30.0–100.0)

## 2016-10-18 LAB — PSA, TOTAL AND FREE
PSA FREE PCT: 35 %
PSA FREE: 0.07 ng/mL
Prostate Specific Ag, Serum: 0.2 ng/mL (ref 0.0–4.0)

## 2016-10-18 LAB — TESTOSTERONE,FREE AND TOTAL
TESTOSTERONE FREE: 10 pg/mL (ref 6.8–21.5)
Testosterone: 312 ng/dL (ref 264–916)

## 2016-10-20 ENCOUNTER — Other Ambulatory Visit: Payer: Self-pay

## 2016-10-20 DIAGNOSIS — G4730 Sleep apnea, unspecified: Secondary | ICD-10-CM

## 2016-11-05 ENCOUNTER — Ambulatory Visit: Payer: BLUE CROSS/BLUE SHIELD | Admitting: Family Medicine

## 2016-11-10 ENCOUNTER — Telehealth: Payer: Self-pay | Admitting: *Deleted

## 2016-11-10 NOTE — Telephone Encounter (Addendum)
Patient called and wants to try Axiron 90 days generic. [facility] and 90 day supply. He will need prior authorization. [phone]

## 2016-11-11 ENCOUNTER — Encounter: Payer: Self-pay | Admitting: Family Medicine

## 2016-11-11 ENCOUNTER — Encounter: Payer: Self-pay | Admitting: *Deleted

## 2016-11-11 NOTE — Telephone Encounter (Signed)
The patient's last testosterone numbers were within normal limits. At that time he had not been using any testosterone replacement. I do not think insurance will justify treatment for testosterone with testosterone numbers being within normal limits. Please explained this to him. We can have this discussion further if necessary and if I need to call him.

## 2016-11-11 NOTE — Telephone Encounter (Signed)
Sent  Dr. Kathi DerMoore's [facility]

## 2016-12-25 ENCOUNTER — Encounter: Payer: Self-pay | Admitting: Family Medicine

## 2016-12-26 ENCOUNTER — Other Ambulatory Visit: Payer: Self-pay

## 2016-12-26 ENCOUNTER — Other Ambulatory Visit: Payer: Self-pay | Admitting: *Deleted

## 2016-12-26 DIAGNOSIS — R945 Abnormal results of liver function studies: Principal | ICD-10-CM

## 2016-12-26 DIAGNOSIS — M109 Gout, unspecified: Secondary | ICD-10-CM

## 2016-12-26 DIAGNOSIS — R7989 Other specified abnormal findings of blood chemistry: Secondary | ICD-10-CM

## 2017-01-02 ENCOUNTER — Encounter: Payer: Self-pay | Admitting: *Deleted

## 2017-01-02 NOTE — Progress Notes (Unsigned)
Scanned in outside lab results for patient and sent to Dr. Christell Constant. Patient was worried a couple of weeks ago because previous lab work showed elevated liver enzymes and this was the repeat lab work .

## 2017-01-05 NOTE — Telephone Encounter (Signed)
-----   Message from Ernestina Penna, MD sent at 01/02/2017  7:00 PM EDT ----- There was a history from the patient of elevated liver function tests from an outside report. It appears that on this report all liver function tests were within normal limits. He was to have liver function tests repeated in our office. Please correct me if I have  read this incorrectly and then if we are getting liver function tests in the office, we will double check and make sure that they all remained normal. Also there is no specific date on when the outside blood work tests were done. On the tests reported the magnesium and uric acid tests were slightly elevated but not an alarming way.

## 2017-01-06 ENCOUNTER — Other Ambulatory Visit: Payer: BLUE CROSS/BLUE SHIELD

## 2017-01-06 DIAGNOSIS — M109 Gout, unspecified: Secondary | ICD-10-CM

## 2017-01-06 DIAGNOSIS — R945 Abnormal results of liver function studies: Principal | ICD-10-CM

## 2017-01-06 DIAGNOSIS — R7989 Other specified abnormal findings of blood chemistry: Secondary | ICD-10-CM | POA: Diagnosis not present

## 2017-01-07 ENCOUNTER — Encounter: Payer: Self-pay | Admitting: Family Medicine

## 2017-01-07 LAB — HEPATIC FUNCTION PANEL
ALK PHOS: 63 IU/L (ref 39–117)
ALT: 44 IU/L (ref 0–44)
AST: 42 IU/L — AB (ref 0–40)
Albumin: 4.7 g/dL (ref 3.5–5.5)
BILIRUBIN, DIRECT: 0.09 mg/dL (ref 0.00–0.40)
Bilirubin Total: 0.3 mg/dL (ref 0.0–1.2)
Total Protein: 7.1 g/dL (ref 6.0–8.5)

## 2017-01-07 LAB — URIC ACID: URIC ACID: 5.8 mg/dL (ref 3.7–8.6)

## 2017-01-27 ENCOUNTER — Ambulatory Visit (INDEPENDENT_AMBULATORY_CARE_PROVIDER_SITE_OTHER): Payer: BLUE CROSS/BLUE SHIELD

## 2017-01-27 DIAGNOSIS — Z23 Encounter for immunization: Secondary | ICD-10-CM | POA: Diagnosis not present

## 2017-02-03 ENCOUNTER — Institutional Professional Consult (permissible substitution): Payer: BLUE CROSS/BLUE SHIELD | Admitting: Internal Medicine

## 2017-02-19 ENCOUNTER — Encounter: Payer: Self-pay | Admitting: Family Medicine

## 2017-02-19 ENCOUNTER — Other Ambulatory Visit: Payer: Self-pay

## 2017-02-19 DIAGNOSIS — E785 Hyperlipidemia, unspecified: Secondary | ICD-10-CM

## 2017-02-19 MED ORDER — FENOFIBRATE 145 MG PO TABS
145.0000 mg | ORAL_TABLET | Freq: Every day | ORAL | 3 refills | Status: DC
Start: 2017-02-19 — End: 2017-07-01

## 2017-02-19 MED ORDER — OMEPRAZOLE 40 MG PO CPDR: 40.0000 mg | DELAYED_RELEASE_CAPSULE | Freq: Every day | ORAL | 3 refills | Status: DC

## 2017-04-16 ENCOUNTER — Ambulatory Visit: Payer: BLUE CROSS/BLUE SHIELD | Admitting: Family Medicine

## 2017-05-04 ENCOUNTER — Ambulatory Visit: Payer: BLUE CROSS/BLUE SHIELD | Admitting: Family Medicine

## 2017-05-04 ENCOUNTER — Encounter: Payer: Self-pay | Admitting: Internal Medicine

## 2017-05-04 ENCOUNTER — Encounter: Payer: Self-pay | Admitting: Family Medicine

## 2017-05-04 VITALS — BP 138/89 | HR 88 | Temp 98.0°F | Ht 71.0 in | Wt 245.0 lb

## 2017-05-04 DIAGNOSIS — Z1211 Encounter for screening for malignant neoplasm of colon: Secondary | ICD-10-CM | POA: Diagnosis not present

## 2017-05-04 DIAGNOSIS — B370 Candidal stomatitis: Secondary | ICD-10-CM

## 2017-05-04 DIAGNOSIS — T7840XA Allergy, unspecified, initial encounter: Secondary | ICD-10-CM

## 2017-05-04 DIAGNOSIS — L739 Follicular disorder, unspecified: Secondary | ICD-10-CM | POA: Diagnosis not present

## 2017-05-04 MED ORDER — MUPIROCIN 2 % EX OINT: 1.0000 "application " | TOPICAL_OINTMENT | Freq: Two times a day (BID) | CUTANEOUS | 0 refills | Status: DC

## 2017-05-04 MED ORDER — NYSTATIN 100000 UNIT/ML MT SUSP: 5.0000 mL | Freq: Four times a day (QID) | OROMUCOSAL | 0 refills | Status: DC

## 2017-05-04 NOTE — Progress Notes (Signed)
BP 138/89   Pulse 88   Temp 98 F (36.7 C) (Oral)   Ht 5\' 11"  (1.803 m)   Wt 245 lb (111.1 kg)   BMI 34.17 kg/m    Subjective:    Patient ID: Scott Rasmussen, male    DOB: Aug 27, 1965, 52 y.o.   MRN: [id number]  HPI: Scott Rasmussen is a 52 y.o. male presenting on 05/04/2017 for Cyst in groin area and Mouth dryness, blisters (5 days ago was riding down road eating an apple and blisters began forming in his mouth, since then mouth has been extremely dry)   HPI Blisters and sores in mouth Patient is coming in today because about 5 days ago he had eaten an apple and he developed blisters and sores in his mouth almost immediately after eating the apple.  He says the blisters and sores have resolved but he still has an abnormal sensation and tingling in his mouth along with some white small patches covering his tongue.  He also says that things just taste different than what they normally do.  He denies any fevers or chills or shortness of breath or wheezing.  He denies any closing up of his throat.  He has never had this before and he is eaten many capsules previously.  The only thing that he can think of is about the output from a different store and he did not clean it off previously.  He has not eaten an apple since this time.  Skin spot on left groin Patient has a small spot on the mons pubis of his left groin that is similar to the ones that he had one follicles get infected at the back of his scalp.  He says this 1 though has not formed a head and does not feel hot or warm or painful but he has come in to get it checked out.  He denies any fevers or chills or redness or warmth.  He denies any drainage from this spot.  Relevant past medical, surgical, family and social history reviewed and updated as indicated. Interim medical history since our last visit reviewed. Allergies and medications reviewed and updated.  Review of Systems  Constitutional: Negative for chills and fever.  HENT:  Positive for mouth sores. Negative for congestion, ear discharge, ear pain, rhinorrhea, sinus pressure, sinus pain, sneezing and sore throat.   Eyes: Negative for discharge.  Respiratory: Negative for cough, shortness of breath and wheezing.   Cardiovascular: Negative for chest pain and leg swelling.  Musculoskeletal: Negative for back pain and gait problem.  Skin: Positive for rash.  All other systems reviewed and are negative.   Per HPI unless specifically indicated above        Objective:    BP 138/89   Pulse 88   Temp 98 F (36.7 C) (Oral)   Ht 5\' 11"  (1.803 m)   Wt 245 lb (111.1 kg)   BMI 34.17 kg/m   Wt Readings from Last 3 Encounters:  05/04/17 245 lb (111.1 kg)  10/15/16 242 lb (109.8 kg)  05/15/16 238 lb (108 kg)    Physical Exam  Constitutional: He is oriented to person, place, and time. He appears well-developed and well-nourished. No distress.  Eyes: Conjunctivae are normal. No scleral icterus.  Cardiovascular: Normal rate, regular rhythm, normal heart sounds and intact distal pulses.  No murmur heard. Pulmonary/Chest: Effort normal and breath sounds normal. No respiratory distress. He has no wheezes. He has no rales.  Musculoskeletal: Normal  range of motion. He exhibits no edema.  Neurological: He is alert and oriented to person, place, and time. Coordination normal.  Skin: Skin is warm and dry. Lesion (Small papule on right mons pubis, no induration or fluctuation or tenderness or warmth) noted. No rash noted. He is not diaphoretic.  Psychiatric: He has a normal mood and affect. His behavior is normal.  Nursing note and vitals reviewed.     Assessment & Plan:   Problem List Items Addressed This Visit    None    Visit Diagnoses    Folliculitis    -  Primary   Relevant Medications   mupirocin ointment (BACTROBAN) 2 %   Encounter for screening colonoscopy       Relevant Orders   Ambulatory referral to Gastroenterology   Thrush, oral       Relevant  Medications   mupirocin ointment (BACTROBAN) 2 %   nystatin (MYCOSTATIN) 100000 UNIT/ML suspension   Allergic reaction, initial encounter        Recommended for patient to be cautious with apples in the future and if he has any similar reactions that we will need to send him to an allergist.  Follow up plan: Return if symptoms worsen or fail to improve.  Counseling provided for all of the vaccine components Orders Placed This Encounter  Procedures  . Ambulatory referral to Gastroenterology    Arville CareJoshua Dettinger, MD [facility] 05/04/2017, 12:09 PM

## 2017-05-18 ENCOUNTER — Ambulatory Visit: Payer: BLUE CROSS/BLUE SHIELD | Admitting: Family Medicine

## 2017-06-20 ENCOUNTER — Other Ambulatory Visit: Payer: Self-pay | Admitting: Family Medicine

## 2017-07-01 ENCOUNTER — Ambulatory Visit: Payer: BLUE CROSS/BLUE SHIELD | Admitting: Family Medicine

## 2017-07-01 ENCOUNTER — Encounter: Payer: Self-pay | Admitting: Family Medicine

## 2017-07-01 VITALS — BP 139/88 | HR 104 | Temp 97.8°F | Ht 71.0 in | Wt 245.0 lb

## 2017-07-01 DIAGNOSIS — M109 Gout, unspecified: Secondary | ICD-10-CM | POA: Diagnosis not present

## 2017-07-01 DIAGNOSIS — N400 Enlarged prostate without lower urinary tract symptoms: Secondary | ICD-10-CM | POA: Diagnosis not present

## 2017-07-01 DIAGNOSIS — E781 Pure hyperglyceridemia: Secondary | ICD-10-CM

## 2017-07-01 DIAGNOSIS — E7800 Pure hypercholesterolemia, unspecified: Secondary | ICD-10-CM | POA: Diagnosis not present

## 2017-07-01 DIAGNOSIS — I700 Atherosclerosis of aorta: Secondary | ICD-10-CM | POA: Diagnosis not present

## 2017-07-01 DIAGNOSIS — L290 Pruritus ani: Secondary | ICD-10-CM

## 2017-07-01 DIAGNOSIS — L739 Follicular disorder, unspecified: Secondary | ICD-10-CM | POA: Diagnosis not present

## 2017-07-01 DIAGNOSIS — E349 Endocrine disorder, unspecified: Secondary | ICD-10-CM

## 2017-07-01 DIAGNOSIS — K219 Gastro-esophageal reflux disease without esophagitis: Secondary | ICD-10-CM

## 2017-07-01 DIAGNOSIS — E559 Vitamin D deficiency, unspecified: Secondary | ICD-10-CM

## 2017-07-01 LAB — URINALYSIS, COMPLETE
BILIRUBIN UA: NEGATIVE
GLUCOSE, UA: NEGATIVE
KETONES UA: NEGATIVE
Leukocytes, UA: NEGATIVE
NITRITE UA: NEGATIVE
Protein, UA: NEGATIVE
SPEC GRAV UA: 1.02 (ref 1.005–1.030)
UUROB: 0.2 mg/dL (ref 0.2–1.0)
pH, UA: 6 (ref 5.0–7.5)

## 2017-07-01 LAB — MICROSCOPIC EXAMINATION
BACTERIA UA: NONE SEEN
Epithelial Cells (non renal): NONE SEEN /hpf (ref 0–10)
RENAL EPITHEL UA: NONE SEEN /HPF
WBC UA: NONE SEEN /HPF (ref 0–?)

## 2017-07-01 MED ORDER — ICOSAPENT ETHYL 1 G PO CAPS: 2.0000 g | ORAL_CAPSULE | Freq: Two times a day (BID) | ORAL | 3 refills | Status: DC

## 2017-07-01 MED ORDER — HYDROCORTISONE 2.5 % RE CREA: 1.0000 "application " | TOPICAL_CREAM | Freq: Two times a day (BID) | RECTAL | 1 refills | Status: DC

## 2017-07-01 MED ORDER — OMEPRAZOLE 40 MG PO CPDR: 40.0000 mg | DELAYED_RELEASE_CAPSULE | Freq: Every day | ORAL | 3 refills | Status: DC

## 2017-07-01 MED ORDER — FEBUXOSTAT 40 MG PO TABS: 40.0000 mg | ORAL_TABLET | Freq: Every day | ORAL | 3 refills | Status: DC

## 2017-07-01 MED ORDER — DOXYCYCLINE HYCLATE 100 MG PO TABS: 100.0000 mg | ORAL_TABLET | Freq: Two times a day (BID) | ORAL | 1 refills | Status: DC

## 2017-07-01 NOTE — Patient Instructions (Addendum)
Continue current medications. Continue good therapeutic lifestyle changes which include good diet and exercise. Fall precautions discussed with patient. If an FOBT was given today- please return it to our front desk. If you are over 50 years old - you may need Prevnar 13 or the adult Pneumonia vaccine.  **Flu shots are available--- please call and schedule a FLU-CLINIC appointment**  After your visit with us today you will receive a survey in the mail or online from [employer] regarding your care with us. Please take a moment to fill this out. Your feedback is very important to us as you can help us better understand your patient needs as well as improve your experience and satisfaction. WE CARE ABOUT YOU!!!   Keep appt for colonoscopy Take antibiotic as directed We will call you when your Urine and labs return Apply ProctoCream twice daily to anal area using the applicator for the first couple of times and then apply topically for 7-10 days. Use soaps fabric softeners and detergents that are scent free Continue to drink plenty of water and stay well-hydrated

## 2017-07-01 NOTE — Progress Notes (Signed)
Subjective:    Patient ID: Scott Rasmussen, male    DOB: Jun 28, 1965, 52 y.o.   MRN: [id number]  HPI Pt here for follow up and management of chronic medical problems which includes hyperlipidemia.Marland Kitchen He is taking medication regularly.  The patient complains today of continued problems with hair follicles in his scalp and groin area.  He would like a refill on his doxycycline.  He continues to have problems with his foot and with gout pain.  We will make sure that he stays on his [place] and check a uric acid today.  He also complains of anal itching off and on that is bothersome.  He is scheduled for his first colonoscopy on April 11.  He denies any chest pain or shortness of breath.  He denies any other problems with his intestinal tract other than the anal itching.  He is passing his water without problems and has no complaints with that part of his body today.    Patient Active Problem List   Diagnosis Date Noted  . Thoracic aortic atherosclerosis ([place]) 12/05/2015  . Depression 06/01/2015  . Allergic rhinitis 08/15/2014  . Abnormal liver function test 11/08/2013  . Obesity (BMI 30.0-34.9) 01/03/2013  . Gout 11/26/2012  . Hyperlipidemia 11/26/2012  . GERD (gastroesophageal reflux disease) 11/26/2012   Outpatient Encounter Medications as of 07/01/2017  Medication Sig  . febuxostat (ULORIC) 40 MG tablet Take 1 tablet (40 mg total) by mouth daily.  . fenofibrate (TRICOR) 145 MG tablet Take 1 tablet (145 mg total) by mouth daily.  . fluocinonide-emollient (LIDEX-E) 0.05 % cream Apply 1 application topically 2 (two) times daily.  . fluticasone (FLONASE) 50 MCG/ACT nasal spray Place 2 sprays into both nostrils daily. (Patient taking differently: Place 2 sprays into both nostrils as needed. )  . loratadine (CLARITIN) 10 MG tablet Take 1 tablet (10 mg total) by mouth daily. (Patient taking differently: Take 10 mg by mouth daily as needed. )  . nystatin (MYCOSTATIN) 100000 UNIT/ML suspension Take  5 mLs (500,000 Units total) by mouth 4 (four) times daily.  Marland Kitchen omega-3 acid ethyl esters (LOVAZA) 1 g capsule Take 2 capsules (2 g total) by mouth 2 (two) times daily.  Marland Kitchen omeprazole (PRILOSEC) 40 MG capsule Take 1 capsule (40 mg total) by mouth daily.  Marland Kitchen trimethoprim-polymyxin b (POLYTRIM) ophthalmic solution Place 1-2 drops into the left eye 3 (three) times daily. For 7 days  . [DISCONTINUED] mupirocin ointment (BACTROBAN) 2 % Place 1 application into the nose 2 (two) times daily.   No facility-administered encounter medications on file as of 07/01/2017.       Review of Systems  Constitutional: Negative.   HENT: Negative.   Eyes: Negative.   Respiratory: Negative.   Cardiovascular: Negative.   Gastrointestinal: Negative.   Endocrine: Negative.   Genitourinary:       Peri-rectal itching  Musculoskeletal: Negative.        Gout flare up - from time to time  Skin: Negative.        Knots in head - folliculitis  Allergic/Immunologic: Negative.   Neurological: Negative.   Hematological: Negative.   Psychiatric/Behavioral: Negative.        Objective:   Physical Exam  Constitutional: He is oriented to person, place, and time. He appears well-developed and well-nourished. No distress.  Patient is pleasant and alert and seems calmer today.  HENT:  Head: Normocephalic and atraumatic.  Right Ear: External ear normal.  Left Ear: External ear normal.  Mouth/Throat:  Oropharynx is clear and moist. No oropharyngeal exudate.  Nasal turbinate congestion  Eyes: Conjunctivae and EOM are normal. Pupils are equal, round, and reactive to light. Right eye exhibits no discharge. Left eye exhibits no discharge. No scleral icterus.  Neck: Normal range of motion. Neck supple. No thyromegaly present.  No bruits thyromegaly or anterior or posterior cervical adenopathy.  Areas of folliculitis and posterior scalp  Cardiovascular: Normal rate, regular rhythm, normal heart sounds and intact distal pulses.    No murmur heard. The heart is regular at 96/min  Pulmonary/Chest: Effort normal and breath sounds normal. No respiratory distress. He has no wheezes. He has no rales.  Clear anteriorly and posteriorly no axillary adenopathy  Abdominal: Soft. Bowel sounds are normal. He exhibits no mass. There is no tenderness. There is no rebound and no guarding.  Obesity without masses tenderness or organ enlargement.  Genitourinary: Rectum normal.  Genitourinary Comments: Pale scaling area of skin around anal opening with no specific hemorrhoids seen externally.  Musculoskeletal: Normal range of motion. He exhibits no edema.  Lymphadenopathy:    He has no cervical adenopathy.  Neurological: He is alert and oriented to person, place, and time. He has normal reflexes. No cranial nerve deficit.  Skin: Skin is warm and dry. No rash noted.  Hair follicle inflammation in suprapubic area and occipital scalp.  Psychiatric: He has a normal mood and affect. His behavior is normal. Judgment and thought content normal.  Nursing note and vitals reviewed.   BP 139/88 (BP Location: Left Arm)   Pulse (!) 104   Temp 97.8 F (36.6 C) (Oral)   Ht '5\' 11"'  (1.803 m)   Wt 245 lb (111.1 kg)   BMI 34.17 kg/m       Assessment & Plan:  1. Gastroesophageal reflux disease, esophagitis presence not specified -No complaints with stomach today and patient will continue with his omeprazole - CBC with Differential/Platelet; Future - Hepatic function panel; Future  2. Vitamin D deficiency -Continue with vitamin D replacement pending results of lab work - CBC with Differential/Platelet; Future - VITAMIN D 25 Hydroxy (Vit-D Deficiency, Fractures); Future  3. Pure hypercholesterolemia -Patient does have mixed dyslipidemia with elevated triglycerides as well as cholesterol.  He is on fenofibrate and omega-3 fatty acids.  We will try switching him to Vascepa if it is covered by his insurance.- BMP8+EGFR; Future - CBC with  Differential/Platelet; Future - Lipid panel; Future  4. Testosterone deficiency -Continue with current treatment - Urine Culture - Urinalysis, Complete - CBC with Differential/Platelet; Future - Testosterone,Free and Total; Future - PSA, total and free; Future  5. Benign prostatic hyperplasia, unspecified whether lower urinary tract symptoms present -Prostate was not examined today only a rectal inspection was done because of his itching issues. - Urine Culture - Urinalysis, Complete - CBC with Differential/Platelet; Future - Testosterone,Free and Total; Future - PSA, total and free; Future  6. Thoracic aortic atherosclerosis ([place]) -Continue with aggressive therapeutic lifestyle changes pending results of lab work and consider adding the new omega-3 fatty acid or vascepa - BMP8+EGFR; Future - CBC with Differential/Platelet; Future - Lipid panel; Future  7. Gout, unspecified cause, unspecified chronicity, unspecified site -Continue with uloric - Uric acid; Future  8. Folliculitis -Doxycycline 100 mg twice daily for at least 7 days  9. Pruritus ani -ProctoCream-HC  10. Hypertriglyceridemia -Vascepa 2 g twice daily and discontinue omega-3 fatty acids  Meds ordered this encounter  Medications  . febuxostat (ULORIC) 40 MG tablet    Sig:  Take 1 tablet (40 mg total) by mouth daily.    Dispense:  90 tablet    Refill:  3  . omeprazole (PRILOSEC) 40 MG capsule    Sig: Take 1 capsule (40 mg total) by mouth daily.    Dispense:  90 capsule    Refill:  3  . Icosapent Ethyl 1 g CAPS    Sig: Take 2 capsules (2 g total) by mouth 2 (two) times daily.    Dispense:  360 capsule    Refill:  3  . hydrocortisone (ANUSOL-HC) 2.5 % rectal cream    Sig: Place 1 application rectally 2 (two) times daily.    Dispense:  30 g    Refill:  1  . doxycycline (VIBRA-TABS) 100 MG tablet    Sig: Take 1 tablet (100 mg total) by mouth 2 (two) times daily. 1 po bid    Dispense:  20 tablet     Refill:  1   Patient Instructions  Continue current medications. Continue good therapeutic lifestyle changes which include good diet and exercise. Fall precautions discussed with patient. If an FOBT was given today- please return it to our front desk. If you are over 18 years old - you may need Prevnar 47 or the adult Pneumonia vaccine.  **Flu shots are available--- please call and schedule a FLU-CLINIC appointment**  After your visit with [place] today you will receive a survey in the mail or online from [employer] regarding your care with [place]. Please take a moment to fill this out. Your feedback is very important to [place] as you can help [place] better understand your patient needs as well as improve your experience and satisfaction. WE CARE ABOUT YOU!!!   Keep appt for colonoscopy Take antibiotic as directed We will call you when your Urine and labs return Apply ProctoCream twice daily to anal area using the applicator for the first couple of times and then apply topically for 7-10 days. Use soaps fabric softeners and detergents that are scent free Continue to drink plenty of water and stay well-hydrated  Arrie Senate MD

## 2017-07-02 ENCOUNTER — Other Ambulatory Visit: Payer: BLUE CROSS/BLUE SHIELD

## 2017-07-02 DIAGNOSIS — I700 Atherosclerosis of aorta: Secondary | ICD-10-CM | POA: Diagnosis not present

## 2017-07-02 DIAGNOSIS — M109 Gout, unspecified: Secondary | ICD-10-CM | POA: Diagnosis not present

## 2017-07-02 DIAGNOSIS — N400 Enlarged prostate without lower urinary tract symptoms: Secondary | ICD-10-CM | POA: Diagnosis not present

## 2017-07-02 DIAGNOSIS — E7800 Pure hypercholesterolemia, unspecified: Secondary | ICD-10-CM

## 2017-07-02 DIAGNOSIS — K219 Gastro-esophageal reflux disease without esophagitis: Secondary | ICD-10-CM | POA: Diagnosis not present

## 2017-07-02 DIAGNOSIS — E349 Endocrine disorder, unspecified: Secondary | ICD-10-CM | POA: Diagnosis not present

## 2017-07-02 DIAGNOSIS — E559 Vitamin D deficiency, unspecified: Secondary | ICD-10-CM

## 2017-07-03 LAB — BMP8+EGFR
BUN/Creatinine Ratio: 16 (ref 9–20)
BUN: 18 mg/dL (ref 6–24)
CHLORIDE: 104 mmol/L (ref 96–106)
CO2: 24 mmol/L (ref 20–29)
Calcium: 9.9 mg/dL (ref 8.7–10.2)
Creatinine, Ser: 1.11 mg/dL (ref 0.76–1.27)
GFR, EST AFRICAN AMERICAN: 89 mL/min/{1.73_m2} (ref >59)
GFR, EST NON AFRICAN AMERICAN: 77 mL/min/{1.73_m2} (ref >59)
Glucose: 95 mg/dL (ref 65–99)
POTASSIUM: 4.5 mmol/L (ref 3.5–5.2)
SODIUM: 143 mmol/L (ref 134–144)

## 2017-07-03 LAB — LIPID PANEL
CHOL/HDL RATIO: 4.4 ratio (ref 0.0–5.0)
Cholesterol, Total: 163 mg/dL (ref 100–199)
HDL: 37 mg/dL — AB (ref >39)
LDL CALC: 90 mg/dL (ref 0–99)
Triglycerides: 182 mg/dL — ABNORMAL HIGH (ref 0–149)
VLDL CHOLESTEROL CAL: 36 mg/dL (ref 5–40)

## 2017-07-03 LAB — HEPATIC FUNCTION PANEL
ALBUMIN: 4.7 g/dL (ref 3.5–5.5)
ALK PHOS: 72 IU/L (ref 39–117)
ALT: 51 IU/L — ABNORMAL HIGH (ref 0–44)
AST: 36 IU/L (ref 0–40)
BILIRUBIN TOTAL: 0.3 mg/dL (ref 0.0–1.2)
BILIRUBIN, DIRECT: 0.12 mg/dL (ref 0.00–0.40)
TOTAL PROTEIN: 6.9 g/dL (ref 6.0–8.5)

## 2017-07-03 LAB — CBC WITH DIFFERENTIAL/PLATELET
Basophils Absolute: 0.1 10*3/uL (ref 0.0–0.2)
Basos: 1 %
EOS (ABSOLUTE): 0.2 10*3/uL (ref 0.0–0.4)
Eos: 4 %
HEMATOCRIT: 51.4 % — AB (ref 37.5–51.0)
Hemoglobin: 17.7 g/dL (ref 13.0–17.7)
IMMATURE GRANULOCYTES: 0 %
Immature Grans (Abs): 0 10*3/uL (ref 0.0–0.1)
LYMPHS ABS: 2 10*3/uL (ref 0.7–3.1)
Lymphs: 35 %
MCH: 29.4 pg (ref 26.6–33.0)
MCHC: 34.4 g/dL (ref 31.5–35.7)
MCV: 85 fL (ref 79–97)
MONOS ABS: 0.5 10*3/uL (ref 0.1–0.9)
Monocytes: 9 %
NEUTROS PCT: 51 %
Neutrophils Absolute: 2.9 10*3/uL (ref 1.4–7.0)
PLATELETS: 254 10*3/uL (ref 150–379)
RBC: 6.03 x10E6/uL — AB (ref 4.14–5.80)
RDW: 13.7 % (ref 12.3–15.4)
WBC: 5.6 10*3/uL (ref 3.4–10.8)

## 2017-07-03 LAB — URIC ACID: Uric Acid: 6.5 mg/dL (ref 3.7–8.6)

## 2017-07-03 LAB — TESTOSTERONE,FREE AND TOTAL
TESTOSTERONE FREE: 8.7 pg/mL (ref 7.2–24.0)
Testosterone: 254 ng/dL — ABNORMAL LOW (ref 264–916)

## 2017-07-03 LAB — VITAMIN D 25 HYDROXY (VIT D DEFICIENCY, FRACTURES): Vit D, 25-Hydroxy: 27.7 ng/mL — ABNORMAL LOW (ref 30.0–100.0)

## 2017-07-03 LAB — URINE CULTURE: Organism ID, Bacteria: NO GROWTH

## 2017-07-03 LAB — PSA, TOTAL AND FREE
PSA FREE PCT: 45 %
PSA, Free: 0.09 ng/mL
Prostate Specific Ag, Serum: 0.2 ng/mL (ref 0.0–4.0)

## 2017-07-06 ENCOUNTER — Other Ambulatory Visit: Payer: Self-pay | Admitting: *Deleted

## 2017-07-06 MED ORDER — VITAMIN D (ERGOCALCIFEROL) 1.25 MG (50000 UNIT) PO CAPS: 50000.0000 [IU] | ORAL_CAPSULE | ORAL | 3 refills | Status: DC

## 2017-07-15 ENCOUNTER — Other Ambulatory Visit: Payer: Self-pay | Admitting: Family Medicine

## 2017-07-22 ENCOUNTER — Ambulatory Visit (AMBULATORY_SURGERY_CENTER): Payer: Self-pay | Admitting: *Deleted

## 2017-07-22 ENCOUNTER — Other Ambulatory Visit: Payer: Self-pay

## 2017-07-22 VITALS — Ht 70.5 in | Wt 237.0 lb

## 2017-07-22 DIAGNOSIS — Z1211 Encounter for screening for malignant neoplasm of colon: Secondary | ICD-10-CM

## 2017-07-22 MED ORDER — PEG-KCL-NACL-NASULF-NA ASC-C 140 G PO SOLR: 1.0000 | Freq: Once | ORAL | 0 refills | Status: AC

## 2017-07-22 NOTE — Progress Notes (Signed)
Patient denies any allergies to eggs or soy. Patient denies any problems with anesthesia/sedation. Patient denies any oxygen use at home. Patient denies taking any diet/weight loss medications or blood thinners. EMMI education assisgned to patient on colonoscopy, this was explained and instructions given to patient. plenvu universal coupon given to pt during pv.

## 2017-07-23 ENCOUNTER — Encounter: Payer: Self-pay | Admitting: Gastroenterology

## 2017-07-30 ENCOUNTER — Encounter: Payer: Self-pay | Admitting: Gastroenterology

## 2017-07-30 ENCOUNTER — Other Ambulatory Visit: Payer: Self-pay | Admitting: *Deleted

## 2017-07-30 ENCOUNTER — Ambulatory Visit (AMBULATORY_SURGERY_CENTER): Payer: BLUE CROSS/BLUE SHIELD | Admitting: Gastroenterology

## 2017-07-30 ENCOUNTER — Other Ambulatory Visit: Payer: Self-pay

## 2017-07-30 VITALS — BP 128/85 | HR 74 | Temp 98.7°F | Resp 18

## 2017-07-30 DIAGNOSIS — Z1211 Encounter for screening for malignant neoplasm of colon: Secondary | ICD-10-CM

## 2017-07-30 DIAGNOSIS — Z1212 Encounter for screening for malignant neoplasm of rectum: Secondary | ICD-10-CM | POA: Diagnosis not present

## 2017-07-30 MED ORDER — FLUTICASONE PROPIONATE 50 MCG/ACT NA SUSP: 2.0000 | Freq: Every day | NASAL | 3 refills | Status: DC

## 2017-07-30 MED ORDER — SODIUM CHLORIDE 0.9 % IV SOLN: 500.0000 mL | Freq: Once | INTRAVENOUS | Status: DC

## 2017-07-30 NOTE — Patient Instructions (Signed)
YOU HAD AN ENDOSCOPIC PROCEDURE TODAY AT THE [facility]:   Refer to the procedure report that was given to you for any specific questions about what was found during the examination.  If the procedure report does not answer your questions, please call your gastroenterologist to clarify.  If you requested that your care partner not be given the details of your procedure findings, then the procedure report has been included in a sealed envelope for you to review at your convenience later.  YOU SHOULD EXPECT: Some feelings of bloating in the abdomen. Passage of more gas than usual.  Walking can help get rid of the air that was put into your GI tract during the procedure and reduce the bloating. If you had a lower endoscopy (such as a colonoscopy or flexible sigmoidoscopy) you may notice spotting of blood in your stool or on the toilet paper. If you underwent a bowel prep for your procedure, you may not have a normal bowel movement for a few days.  Please Note:  You might notice some irritation and congestion in your nose or some drainage.  This is from the oxygen used during your procedure.  There is no need for concern and it should clear up in a day or so.  SYMPTOMS TO REPORT IMMEDIATELY:   Following lower endoscopy (colonoscopy or flexible sigmoidoscopy):  Excessive amounts of blood in the stool  Significant tenderness or worsening of abdominal pains  Swelling of the abdomen that is new, acute  Fever of 100F or higher  Please see handouts given to you on Diverticulosis.  For urgent or emergent issues, a gastroenterologist can be reached at any hour by calling [phone].   DIET:  We do recommend a small meal at first, but then you may proceed to your regular diet.  Drink plenty of fluids but you should avoid alcoholic beverages for 24 hours.  ACTIVITY:  You should plan to take it easy for the rest of today and you should NOT DRIVE or use heavy machinery until tomorrow  (because of the sedation medicines used during the test).    FOLLOW UP: Our staff will call the number listed on your records the next business day following your procedure to check on you and address any questions or concerns that you may have regarding the information given to you following your procedure. If we do not reach you, we will leave a message.  However, if you are feeling well and you are not experiencing any problems, there is no need to return our call.  We will assume that you have returned to your regular daily activities without incident.  If any biopsies were taken you will be contacted by phone or by letter within the next 1-3 weeks.  Please call us at [phone] if you have not heard about the biopsies in 3 weeks.    SIGNATURES/CONFIDENTIALITY: You and/or your care partner have signed paperwork which will be entered into your electronic medical record.  These signatures attest to the fact that that the information above on your After Visit Summary has been reviewed and is understood.  Full responsibility of the confidentiality of this discharge information lies with you and/or your care-partner.  Thank you for letting us take care of your healthcare needs today. 

## 2017-07-30 NOTE — Progress Notes (Signed)
To PACU, vss patent aw report to rn 

## 2017-07-30 NOTE — Progress Notes (Signed)
No changes in medical or surgical hx since PV per pt 

## 2017-07-30 NOTE — Op Note (Signed)
[facility] Patient Name: Scott Rasmussen Procedure Date: 07/30/2017 1:17 PM MRN: [id number] Endoscopist: Sherilyn Cooter L. Myrtie Neither , MD Age: 52 Referring MD:  Date of Birth: [birth year] Gender: Male Account #: [id number] Procedure:                Colonoscopy Indications:              Screening for colorectal malignant neoplasm, This                            is the patient's first colonoscopy Medicines:                Monitored Anesthesia Care Procedure:                Pre-Anesthesia Assessment:                           - Prior to the procedure, a History and Physical                            was performed, and patient medications and                            allergies were reviewed. The patient's tolerance of                            previous anesthesia was also reviewed. The risks                            and benefits of the procedure and the sedation                            options and risks were discussed with the patient.                            All questions were answered, and informed consent                            was obtained. Prior Anticoagulants: The patient has                            taken no previous anticoagulant or antiplatelet                            agents. ASA Grade Assessment: II - A patient with                            mild systemic disease. After reviewing the risks                            and benefits, the patient was deemed in                            satisfactory condition to undergo the procedure.  After obtaining informed consent, the colonoscope                            was passed under direct vision. Throughout the                            procedure, the patient's blood pressure, pulse, and                            oxygen saturations were monitored continuously. The                            Model CF-HQ190L [id number]) scope was introduced                            through the anus and  advanced to the the cecum,                            identified by appendiceal orifice and ileocecal                            valve. The colonoscopy was performed without                            difficulty. The patient tolerated the procedure                            well. The quality of the bowel preparation was                            excellent. The ileocecal valve and rectum were                            photographed. A technical problem precluded                            photo-documentation of the appendiceal orifice. The                            quality of the bowel preparation was evaluated                            using the BBPS [facility] Bowel Preparation Scale)                            with scores of: Right Colon = 3, Transverse Colon =                            3 and Left Colon = 3 (entire mucosa seen well with                            no residual staining, small fragments of stool or  opaque liquid). The total BBPS score equals 9. Scope In: 1:25:56 PM Scope Out: 1:39:07 PM Scope Withdrawal Time: 0 hours 10 minutes 28 seconds  Total Procedure Duration: 0 hours 13 minutes 11 seconds  Findings:                 The perianal and digital rectal examinations were                            normal.                           A few small-mouthed diverticula were found in the                            left colon and right colon.                           The exam was otherwise without abnormality on                            direct and retroflexion views. Complications:            No immediate complications. Estimated Blood Loss:     Estimated blood loss: none. Impression:               - Diverticulosis in the left colon and in the right                            colon.                           - The examination was otherwise normal on direct                            and retroflexion views.                           - No specimens  collected. Recommendation:           - Patient has a contact number available for                            emergencies. The signs and symptoms of potential                            delayed complications were discussed with the                            patient. Return to normal activities tomorrow.                            Written discharge instructions were provided to the                            patient.                           -  Resume previous diet.                           - Continue present medications.                           - Repeat colonoscopy in 10 years for screening                            purposes. Frank Novelo L. Myrtie Neither, MD 07/30/2017 1:51:47 PM This report has been signed electronically.

## 2017-07-31 ENCOUNTER — Telehealth: Payer: Self-pay

## 2017-07-31 NOTE — Telephone Encounter (Signed)
  Follow up Call-  Call back number 07/30/2017  Post procedure Call Back phone  # [phone]  Permission to leave phone message Yes  Some recent data might be hidden     Patient questions:  Do you have a fever, pain , or abdominal swelling? No. Pain Score  0 *  Have you tolerated food without any problems? Yes.    Have you been able to return to your normal activities? Yes.    Do you have any questions about your discharge instructions: Diet   No. Medications  No. Follow up visit  No.  Do you have questions or concerns about your Care? No.  Actions: * If pain score is 4 or above: No action needed, pain <4.

## 2017-08-05 ENCOUNTER — Encounter: Payer: BLUE CROSS/BLUE SHIELD | Admitting: Internal Medicine

## 2017-08-06 ENCOUNTER — Encounter: Payer: BLUE CROSS/BLUE SHIELD | Admitting: Gastroenterology

## 2017-08-14 ENCOUNTER — Ambulatory Visit: Payer: BLUE CROSS/BLUE SHIELD | Admitting: Family Medicine

## 2017-08-14 ENCOUNTER — Encounter: Payer: Self-pay | Admitting: Family Medicine

## 2017-08-14 VITALS — BP 134/92 | HR 77 | Temp 97.6°F | Ht 70.5 in | Wt 240.6 lb

## 2017-08-14 DIAGNOSIS — M1A0710 Idiopathic chronic gout, right ankle and foot, without tophus (tophi): Secondary | ICD-10-CM | POA: Diagnosis not present

## 2017-08-14 DIAGNOSIS — S63501A Unspecified sprain of right wrist, initial encounter: Secondary | ICD-10-CM

## 2017-08-14 NOTE — Patient Instructions (Signed)
I think that the pain in your right wrist/forearm is a sprain.  There is nothing on exam to suggest a fracture.  Continue oral anti-inflammatories, ice, bracing and avoiding activities that worsen wrist pain.  I have provided you a handout with home physical therapy exercises.  If pain significantly worsens, you develop numbness or tingling or gross swelling, please seek reevaluation.

## 2017-08-14 NOTE — Progress Notes (Signed)
Subjective: CC: gout, fall PCP: Ernestina Penna, MD ZOX:[id number] CYLAN BORUM is a 52 y.o. male presenting to clinic today for:  1. Fall  Patient reports that he fell on outstretched hand, the right, 1 week ago.  He notes that he had some discomfort in the wrist after the fall but no gross swelling or ecchymosis.  On Saturday, he noticed some swelling in the wrist.  He does report that he did play golf after injury and thinks that perhaps he may have aggravated things, as he was starting to feel quite a bit of discomfort around the "ninth hole".  He has been wrapping/bracing his wrist, using ibuprofen 800 mg and icing the affected area with little improvement in symptoms.  He notes that pain seems to be worse with grasping/squeezing activities.  He denies weakness, numbness, tingling.  2. Gout Patient reports chronic gout in the right great toe.  He is currently on Uloric.  He notes that he walked around quite a bit this weekend and thinks that that perhaps aggravated his joint.  Denies any gross swelling today, redness or heat.   ROS: Per HPI  Allergies  Allergen Reactions  . Depo-Medrol [Methylprednisolone Acetate] Rash   Past Medical History:  Diagnosis Date  . Gout   . Hyperlipidemia   . Shortness of breath   . Sleep apnea    had sx   . Umbilical hernia     Current Outpatient Medications:  .  doxycycline (VIBRA-TABS) 100 MG tablet, Take 1 tablet (100 mg total) by mouth 2 (two) times daily. 1 po bid, Disp: 20 tablet, Rfl: 1 .  febuxostat (ULORIC) 40 MG tablet, Take 1 tablet (40 mg total) by mouth daily., Disp: 90 tablet, Rfl: 3 .  Fluocinonide Emulsified Base 0.05 % CREA, APPLY ONE APPLICATION TOPICALLY TWICE DAILY, Disp: 30 g, Rfl: 2 .  fluocinonide-emollient (LIDEX-E) 0.05 % cream, Apply 1 application topically 2 (two) times daily., Disp: 90 g, Rfl: 3 .  fluticasone (FLONASE) 50 MCG/ACT nasal spray, Place 2 sprays into both nostrils daily., Disp: 48 g, Rfl: 3 .  hydrocortisone  (ANUSOL-HC) 2.5 % rectal cream, Place 1 application rectally 2 (two) times daily., Disp: 30 g, Rfl: 1 .  Icosapent Ethyl 1 g CAPS, Take 2 capsules (2 g total) by mouth 2 (two) times daily., Disp: 360 capsule, Rfl: 3 .  loratadine (CLARITIN) 10 MG tablet, Take 1 tablet (10 mg total) by mouth daily. (Patient taking differently: Take 10 mg by mouth daily as needed. ), Disp: 90 tablet, Rfl: 3 .  omeprazole (PRILOSEC) 40 MG capsule, Take 1 capsule (40 mg total) by mouth daily., Disp: 90 capsule, Rfl: 3 .  trimethoprim-polymyxin b (POLYTRIM) ophthalmic solution, Place 1-2 drops into the left eye 3 (three) times daily. For 7 days, Disp: 10 mL, Rfl: 0 .  Vitamin D, Ergocalciferol, (DRISDOL) 50000 units CAPS capsule, Take 1 capsule (50,000 Units total) by mouth every 7 (seven) days., Disp: 12 capsule, Rfl: 3  Current Facility-Administered Medications:  .  0.9 %  sodium chloride infusion, 500 mL, Intravenous, Once, Danis, Andreas Blower, MD Social History   Socioeconomic History  . Marital status: Married    Spouse name: Not on file  . Number of children: Not on file  . Years of education: Not on file  . Highest education level: Not on file  Occupational History  . Not on file  Social Needs  . Financial resource strain: Not on file  . Food insecurity:  Worry: Not on file    Inability: Not on file  . Transportation needs:    Medical: Not on file    Non-medical: Not on file  Tobacco Use  . Smoking status: Former Games developermoker  . Smokeless tobacco: Never Used  Substance and Sexual Activity  . Alcohol use: Yes    Comment: 2 drinks per month per pt.  . Drug use: No  . Sexual activity: Not on file  Lifestyle  . Physical activity:    Days per week: Not on file    Minutes per session: Not on file  . Stress: Not on file  Relationships  . Social connections:    Talks on phone: Not on file    Gets together: Not on file    Attends religious service: Not on file    Active member of club or  organization: Not on file    Attends meetings of clubs or organizations: Not on file    Relationship status: Not on file  . Intimate partner violence:    Fear of current or ex partner: Not on file    Emotionally abused: Not on file    Physically abused: Not on file    Forced sexual activity: Not on file  Other Topics Concern  . Not on file  Social History Narrative  . Not on file   Family History  Problem Relation Age of Onset  . Heart disease Father        heart attack  . Colon cancer Neg Hx   . Esophageal cancer Neg Hx   . Rectal cancer Neg Hx   . Stomach cancer Neg Hx     Objective: Office vital signs reviewed. BP (!) 134/92   Pulse 77   Temp 97.6 F (36.4 C) (Oral)   Ht 5' 10.5" (1.791 m)   Wt 240 lb 9.6 oz (109.1 kg)   BMI 34.03 kg/m   Physical Examination:  General: Awake, alert, No acute distress Extremities: warm, well perfused, No edema, cyanosis or clubbing; +2 pulses bilaterally; good capillary refill to the right hand/fingers MSK: normal gait and normal station  Right wrist: No gross swelling, discoloration appreciated on exam.  He has full active range of motion but does have some discomfort with flexion of the wrist.  No difficulty with resisted pronation or supination.  He has normal strength.  No tenderness to palpation over the anatomic snuffbox or wrist.  No tenderness to palpation of the forearm.  No palpable bony abnormalities. Skin: dry; intact; no rashes or lesions Neuro: 5/5 upper extremity strength and light touch sensation grossly intact  Assessment/ Plan: 52 y.o. male   1. Sprain of right wrist, initial encounter No evidence of fracture or ligamentous tear on today's exam.  He had a fairly benign exam.  I suspect that he has a sprain in the right wrist.  Continue brace, ice, oral NSAID.  I did recommend that he decrease his 600 mg 3 times daily.  Home physical therapy exercises reviewed and provided to the patient.  Could consider obtaining a  muscle rub such as Tiger balm or blue emu to apply to the forearm.  Avoid aggravating activities, particularly golfing for at least the next week.  If symptoms are not improving or worsening, he is to seek medical attention.  At that point, would consider imaging studies.  2. Chronic gout of right foot, unspecified cause Not currently in flare.  Continue current medication regimen.   Raliegh IpAshly M Gottschalk, DO [facility] [phone]

## 2017-08-16 DIAGNOSIS — S56911A Strain of unspecified muscles, fascia and tendons at forearm level, right arm, initial encounter: Secondary | ICD-10-CM | POA: Diagnosis not present

## 2017-08-27 ENCOUNTER — Encounter: Payer: Self-pay | Admitting: Family Medicine

## 2017-08-27 ENCOUNTER — Other Ambulatory Visit: Payer: Self-pay | Admitting: *Deleted

## 2017-08-27 MED ORDER — FLUTICASONE PROPIONATE 50 MCG/ACT NA SUSP: 2.0000 | Freq: Every day | NASAL | 3 refills | Status: DC

## 2017-08-27 MED ORDER — LORATADINE 10 MG PO TABS: 10.0000 mg | ORAL_TABLET | Freq: Every day | ORAL | 3 refills | Status: DC

## 2017-10-08 ENCOUNTER — Encounter: Payer: Self-pay | Admitting: Family Medicine

## 2017-10-08 ENCOUNTER — Ambulatory Visit: Payer: BLUE CROSS/BLUE SHIELD | Admitting: Family Medicine

## 2017-10-08 VITALS — BP 135/90 | HR 92 | Temp 97.5°F | Ht 70.5 in | Wt 241.4 lb

## 2017-10-08 DIAGNOSIS — M109 Gout, unspecified: Secondary | ICD-10-CM | POA: Diagnosis not present

## 2017-10-08 MED ORDER — PREDNISONE 10 MG PO TABS
ORAL_TABLET | ORAL | 0 refills | Status: DC
Start: 2017-10-08 — End: 2018-02-23

## 2017-10-08 NOTE — Progress Notes (Signed)
   HPI  Patient presents today right great toe pain.  Patient explains he has had pain now for about 3 weeks, its characteristic of previous gout flares but not quite as severe.  It is however persistent and smoldering he feels.  He is had a recent course of prednisone for right wrist pain which improved that quite a bit.  This was about 8 weeks ago.  He states that the wrist is still bothering a little bit and he would like some prednisone to see if he can help both problems.  He is just returned from vacation and has been playing golf  PMH: Smoking status noted ROS: Per HPI  Objective: BP 135/90   Pulse 92   Temp (!) 97.5 F (36.4 C) (Oral)   Ht 5' 10.5" (1.791 m)   Wt 241 lb 6.4 oz (109.5 kg)   BMI 34.15 kg/m  Gen: NAD, alert, cooperative with exam HEENT: NCAT CV: RRR, good S1/S2, no murmur Resp: CTABL, no wheezes, non-labored Ext: No edema, no significant erythema, tenderness to palpation, or swelling of the right first MTP Neuro: Alert and oriented, No gross deficits  Assessment and plan:  #Acute gout flare Smoldering gout likely Prednisone course This will likely help his wrist pain as well, which has been evaluated with x-rays by orthopedics 8 weeks ago.    Murtis SinkSam Deep Bonawitz, MD [facility] 10/08/2017, 9:24 AM

## 2017-10-14 DIAGNOSIS — S63501A Unspecified sprain of right wrist, initial encounter: Secondary | ICD-10-CM | POA: Diagnosis not present

## 2017-10-14 DIAGNOSIS — S56911D Strain of unspecified muscles, fascia and tendons at forearm level, right arm, subsequent encounter: Secondary | ICD-10-CM | POA: Diagnosis not present

## 2017-10-20 DIAGNOSIS — M25531 Pain in right wrist: Secondary | ICD-10-CM | POA: Diagnosis not present

## 2017-10-29 DIAGNOSIS — M25631 Stiffness of right wrist, not elsewhere classified: Secondary | ICD-10-CM | POA: Diagnosis not present

## 2017-10-29 DIAGNOSIS — M25531 Pain in right wrist: Secondary | ICD-10-CM | POA: Diagnosis not present

## 2017-10-29 DIAGNOSIS — R531 Weakness: Secondary | ICD-10-CM | POA: Diagnosis not present

## 2017-11-19 DEATH — deceased

## 2017-12-29 ENCOUNTER — Ambulatory Visit: Payer: BLUE CROSS/BLUE SHIELD | Admitting: Family Medicine

## 2017-12-30 ENCOUNTER — Ambulatory Visit: Payer: BLUE CROSS/BLUE SHIELD | Admitting: Family Medicine

## 2018-02-09 DIAGNOSIS — L853 Xerosis cutis: Secondary | ICD-10-CM | POA: Diagnosis not present

## 2018-02-09 DIAGNOSIS — L918 Other hypertrophic disorders of the skin: Secondary | ICD-10-CM | POA: Diagnosis not present

## 2018-02-09 DIAGNOSIS — D229 Melanocytic nevi, unspecified: Secondary | ICD-10-CM | POA: Diagnosis not present

## 2018-02-09 DIAGNOSIS — L821 Other seborrheic keratosis: Secondary | ICD-10-CM | POA: Diagnosis not present

## 2018-02-12 DIAGNOSIS — Z6834 Body mass index (BMI) 34.0-34.9, adult: Secondary | ICD-10-CM | POA: Diagnosis not present

## 2018-02-12 DIAGNOSIS — R635 Abnormal weight gain: Secondary | ICD-10-CM | POA: Diagnosis not present

## 2018-02-16 ENCOUNTER — Ambulatory Visit (INDEPENDENT_AMBULATORY_CARE_PROVIDER_SITE_OTHER): Payer: BLUE CROSS/BLUE SHIELD | Admitting: *Deleted

## 2018-02-16 DIAGNOSIS — Z23 Encounter for immunization: Secondary | ICD-10-CM

## 2018-02-23 ENCOUNTER — Encounter: Payer: Self-pay | Admitting: Family Medicine

## 2018-02-23 ENCOUNTER — Ambulatory Visit (INDEPENDENT_AMBULATORY_CARE_PROVIDER_SITE_OTHER): Payer: BLUE CROSS/BLUE SHIELD | Admitting: Family Medicine

## 2018-02-23 VITALS — BP 120/81 | HR 79 | Temp 97.6°F | Ht 70.5 in | Wt 241.0 lb

## 2018-02-23 DIAGNOSIS — E7800 Pure hypercholesterolemia, unspecified: Secondary | ICD-10-CM

## 2018-02-23 DIAGNOSIS — K219 Gastro-esophageal reflux disease without esophagitis: Secondary | ICD-10-CM

## 2018-02-23 DIAGNOSIS — Z6834 Body mass index (BMI) 34.0-34.9, adult: Secondary | ICD-10-CM | POA: Diagnosis not present

## 2018-02-23 DIAGNOSIS — M109 Gout, unspecified: Secondary | ICD-10-CM | POA: Diagnosis not present

## 2018-02-23 DIAGNOSIS — E559 Vitamin D deficiency, unspecified: Secondary | ICD-10-CM

## 2018-02-23 DIAGNOSIS — Z0000 Encounter for general adult medical examination without abnormal findings: Secondary | ICD-10-CM

## 2018-02-23 DIAGNOSIS — Z0001 Encounter for general adult medical examination with abnormal findings: Secondary | ICD-10-CM | POA: Diagnosis not present

## 2018-02-23 DIAGNOSIS — E349 Endocrine disorder, unspecified: Secondary | ICD-10-CM

## 2018-02-23 DIAGNOSIS — I700 Atherosclerosis of aorta: Secondary | ICD-10-CM

## 2018-02-23 LAB — URINALYSIS, COMPLETE
BILIRUBIN UA: NEGATIVE
GLUCOSE, UA: NEGATIVE
KETONES UA: NEGATIVE
Leukocytes, UA: NEGATIVE
NITRITE UA: NEGATIVE
PROTEIN UA: NEGATIVE
SPEC GRAV UA: 1.02 (ref 1.005–1.030)
UUROB: 0.2 mg/dL (ref 0.2–1.0)
pH, UA: 6 (ref 5.0–7.5)

## 2018-02-23 LAB — MICROSCOPIC EXAMINATION
Bacteria, UA: NONE SEEN
Epithelial Cells (non renal): NONE SEEN /hpf (ref 0–10)
RBC, UA: NONE SEEN /hpf (ref 0–2)
Renal Epithel, UA: NONE SEEN /hpf
WBC, UA: NONE SEEN /hpf (ref 0–5)

## 2018-02-23 NOTE — Progress Notes (Signed)
Subjective:    Patient ID: Scott Rasmussen, male    DOB: 07-20-1965, 52 y.o.   MRN: [id number]  HPI  Patient is here today for annual wellness exam and follow up of chronic medical problems which includes hyperlipidemia and gerd. He is taking medication regularly.  The patient is doing well overall.  He is here today for his complete physical.  He will be given an FOBT to return will get lab work today urinalysis today and an EKG.  It is good that he is in a weight loss program at [facility].  His weight is 241 here today his blood pressure is slightly elevated initially but on repeat was good at 120/81.  Outside labs were brought with him to the visit today.  He did have a hemoglobin A1c that was good at 5.3%.  His insulin levels were within normal limits.  His TSH was normal at 2.791.  His cholesterol numbers had a triglyceride that was elevated at 272 and LDL see cholesterol that was elevated at 106 and should be less than 100 good cholesterol based on the lab that ordered this was within normal limits at 37.  The sodium potassium chloride blood sugar and creatinine were all normal.  All liver function tests were within normal limits.  The CBC had a normal white blood cell count and a good hemoglobin at 17.8.  The platelet count was adequate.  No abnormal blood cells were present.  This lab work was done recently and response to his being followed at the weight loss program at [facility] in [place].  Patient has a history of allergic rhinitis depression GERD gout and hyperlipidemia along with thoracic aortic atherosclerosis.  He is currently continuing to take Vascepa Uloric vitamin D replacement and a proton pump inhibitor in addition to Flonase and loratadine.  The patient is pleasant today and seems to be less stressed oriented.  He is going to be following the weight loss clinic at [facility] and I applauded him for this.  Today he denies any  chest pain pressure tightness or shortness of breath anymore than usual.  He has no trouble with swallowing heartburn indigestion nausea vomiting diarrhea blood in the stool constipation or black tarry bowel movements.  He did have a colonoscopy this spring and was told that this was normal and would not need another one for 10 years.  He is passing his water without problems.  He is up-to-date on his eye exams.     Patient Active Problem List   Diagnosis Date Noted  . Thoracic aortic atherosclerosis (HCC) 12/05/2015  . Depression 06/01/2015  . Allergic rhinitis 08/15/2014  . Abnormal liver function test 11/08/2013  . Obesity (BMI 30.0-34.9) 01/03/2013  . Gout 11/26/2012  . Hyperlipidemia 11/26/2012  . GERD (gastroesophageal reflux disease) 11/26/2012   Outpatient Encounter Medications as of 02/23/2018  Medication Sig  . febuxostat (ULORIC) 40 MG tablet Take 1 tablet (40 mg total) by mouth daily.  Bess Harvest Ethyl 1 g CAPS Take 2 capsules (2 g total) by mouth 2 (two) times daily.  Marland Kitchen omeprazole (PRILOSEC) 40 MG capsule Take 1 capsule (40 mg total) by mouth daily.  . Vitamin D, Ergocalciferol, (DRISDOL) 50000 units CAPS capsule Take 1 capsule (50,000 Units total) by mouth every 7 (seven) days.  . Fluocinonide Emulsified Base 0.05 % CREA APPLY ONE APPLICATION TOPICALLY TWICE DAILY (Patient not taking: Reported on 02/23/2018)  . fluocinonide-emollient (LIDEX-E) 0.05 % cream  Apply 1 application topically 2 (two) times daily. (Patient not taking: Reported on 02/23/2018)  . fluticasone (FLONASE) 50 MCG/ACT nasal spray Place 2 sprays into both nostrils daily. (Patient not taking: Reported on 02/23/2018)  . hydrocortisone (ANUSOL-HC) 2.5 % rectal cream Place 1 application rectally 2 (two) times daily.  Marland Kitchen loratadine (CLARITIN) 10 MG tablet Take 1 tablet (10 mg total) by mouth daily. (Patient not taking: Reported on 02/23/2018)  . trimethoprim-polymyxin b (POLYTRIM) ophthalmic solution Place 1-2 drops  into the left eye 3 (three) times daily. For 7 days (Patient not taking: Reported on 02/23/2018)  . [DISCONTINUED] doxycycline (VIBRA-TABS) 100 MG tablet Take 1 tablet (100 mg total) by mouth 2 (two) times daily. 1 po bid (Patient not taking: Reported on 10/08/2017)  . [DISCONTINUED] predniSONE (DELTASONE) 10 MG tablet Take 4 pills a day for 3 days, then 3 pills a day for 3 days, then 2 pills a day for 3 days, then 1 pill a day for 3 days, then stop  . [DISCONTINUED] 0.9 %  sodium chloride infusion    No facility-administered encounter medications on file as of 02/23/2018.      Review of Systems  Constitutional: Negative.   HENT: Negative.   Eyes: Negative.   Respiratory: Negative.   Cardiovascular: Negative.   Gastrointestinal: Negative.   Endocrine: Negative.   Genitourinary: Negative.   Musculoskeletal: Negative.   Skin: Negative.   Allergic/Immunologic: Negative.   Neurological: Negative.   Hematological: Negative.   Psychiatric/Behavioral: Negative.        Objective:   Physical Exam  Constitutional: He is oriented to person, place, and time. He appears well-developed and well-nourished. No distress.  The patient is pleasant alert and calm today.  HENT:  Head: Normocephalic and atraumatic.  Right Ear: External ear normal.  Left Ear: External ear normal.  Mouth/Throat: Oropharynx is clear and moist. No oropharyngeal exudate.  Nasal turbinate congestion left greater than right  Eyes: Pupils are equal, round, and reactive to light. Conjunctivae and EOM are normal. Right eye exhibits no discharge. Left eye exhibits no discharge. No scleral icterus.  Up-to-date on eye exams  Neck: Normal range of motion. Neck supple. No thyromegaly present.  No bruits thyromegaly or anterior cervical adenopathy  Cardiovascular: Normal rate, regular rhythm, normal heart sounds and intact distal pulses.  No murmur heard. Heart is regular at 60/min with good pedal pulses  Pulmonary/Chest: Effort  normal and breath sounds normal. He has no wheezes. He has no rales. He exhibits no tenderness.  Clear anteriorly and posteriorly and no axillary adenopathy or chest wall masses.  Abdominal: Soft. Bowel sounds are normal. He exhibits no mass. There is no tenderness.  Abdominal obesity without liver or spleen enlargement epigastric tenderness suprapubic tenderness masses or bruits.  Genitourinary: Rectum normal and penis normal.  Genitourinary Comments: Minimal if any enlargement of the prostate gland with no lumps or masses.  No rectal masses.  External genitalia were within normal limits and no inguinal hernias were palpable.  Musculoskeletal: Normal range of motion. He exhibits no edema, tenderness or deformity.  Lymphadenopathy:    He has no cervical adenopathy.  Neurological: He is alert and oriented to person, place, and time. He has normal reflexes. No cranial nerve deficit.  Reflexes were 2+ and equal with good strength bilaterally.  Skin: Skin is warm and dry. No rash noted.  No suspicious lumps bumps or nevi noted.  No rash.  Psychiatric: He has a normal mood and affect. His behavior is normal.  Judgment and thought content normal.  The patient's mood affect and behavior were all normal for him.  Nursing note and vitals reviewed.   BP (!) 131/92 (BP Location: Left Arm)   Pulse 79   Temp 97.6 F (36.4 C) (Oral)   Ht 5' 10.5" (1.791 m)   Wt 241 lb (109.3 kg)   BMI 34.09 kg/m   EKG with results pending==     Assessment & Plan:  1. Vitamin D deficiency -Continue with vitamin D replacement pending results of lab work - VITAMIN D 25 Hydroxy (Vit-D Deficiency, Fractures)  2. Pure hypercholesterolemia -Continue with Vascepa and take this 2 g twice daily.  3. Testosterone deficiency - PSA, total and free  4. Thoracic aortic atherosclerosis (HCC) -Continue more aggressive therapeutic lifestyle changes better triglyceride control and weight loss through diet and exercise - EKG  12-Lead  5. Gastroesophageal reflux disease, esophagitis presence not specified -Patient has no complaints with this today.  He will continue with his omeprazole and watch his diet closely.  6. Acute gout involving toe of right foot, unspecified cause -No complaints with gout today.  We will check uric acid. - Uric acid  7. Annual physical exam - Uric acid - PSA, total and free - VITAMIN D 25 Hydroxy (Vit-D Deficiency, Fractures) - Urinalysis, Complete - EKG 12-Lead  8. BMI 34.0-34.9,adult -Work with weight loss program at [facility]  Patient Instructions  Continue to follow-up with weight loss clinic and make every effort through diet and exercise to lose weight and reduce your BMI Stay active physically and drink lots of water Avoid the use of the overhead fan as much as possible Watch sodium intake We will call with the results of the additional lab work done to complement what was done at [facility] as soon as that lab work becomes available and should take that with you to your visit to the weight loss clinic.  Nyra Capes MD

## 2018-02-23 NOTE — Patient Instructions (Signed)
Continue to follow-up with weight loss clinic and make every effort through diet and exercise to lose weight and reduce your BMI Stay active physically and drink lots of water Avoid the use of the overhead fan as much as possible Watch sodium intake We will call with the results of the additional lab work done to complement what was done at [facility] as soon as that lab work becomes available and should take that with you to your visit to the weight loss clinic.

## 2018-02-24 LAB — PSA, TOTAL AND FREE
PSA FREE PCT: 45 %
PSA, Free: 0.09 ng/mL
Prostate Specific Ag, Serum: 0.2 ng/mL (ref 0.0–4.0)

## 2018-02-24 LAB — URIC ACID: Uric Acid: 8.2 mg/dL (ref 3.7–8.6)

## 2018-02-24 LAB — VITAMIN D 25 HYDROXY (VIT D DEFICIENCY, FRACTURES): VIT D 25 HYDROXY: 49.4 ng/mL (ref 30.0–100.0)

## 2018-03-02 ENCOUNTER — Other Ambulatory Visit: Payer: Self-pay | Admitting: *Deleted

## 2018-03-02 ENCOUNTER — Other Ambulatory Visit: Payer: BLUE CROSS/BLUE SHIELD

## 2018-03-02 DIAGNOSIS — Z129 Encounter for screening for malignant neoplasm, site unspecified: Secondary | ICD-10-CM

## 2018-03-03 LAB — FECAL OCCULT BLOOD, IMMUNOCHEMICAL: Fecal Occult Bld: NEGATIVE

## 2018-03-09 DIAGNOSIS — E781 Pure hyperglyceridemia: Secondary | ICD-10-CM | POA: Diagnosis not present

## 2018-03-09 DIAGNOSIS — R5383 Other fatigue: Secondary | ICD-10-CM | POA: Diagnosis not present

## 2018-03-09 DIAGNOSIS — E669 Obesity, unspecified: Secondary | ICD-10-CM | POA: Diagnosis not present

## 2018-03-09 DIAGNOSIS — E8881 Metabolic syndrome: Secondary | ICD-10-CM | POA: Insufficient documentation

## 2018-03-09 DIAGNOSIS — Z6835 Body mass index (BMI) 35.0-35.9, adult: Secondary | ICD-10-CM | POA: Diagnosis not present

## 2018-03-11 ENCOUNTER — Telehealth: Payer: Self-pay | Admitting: Family Medicine

## 2018-03-12 ENCOUNTER — Other Ambulatory Visit: Payer: Self-pay | Admitting: *Deleted

## 2018-03-12 DIAGNOSIS — R5383 Other fatigue: Secondary | ICD-10-CM

## 2018-03-12 NOTE — Telephone Encounter (Signed)
Order placed for B12 level - pt will need to come by the lab   Aware

## 2018-03-16 ENCOUNTER — Other Ambulatory Visit: Payer: BLUE CROSS/BLUE SHIELD

## 2018-03-16 DIAGNOSIS — R5383 Other fatigue: Secondary | ICD-10-CM

## 2018-03-17 LAB — VITAMIN B12: Vitamin B-12: 488 pg/mL (ref 232–1245)

## 2018-03-22 ENCOUNTER — Ambulatory Visit (INDEPENDENT_AMBULATORY_CARE_PROVIDER_SITE_OTHER): Payer: BLUE CROSS/BLUE SHIELD | Admitting: *Deleted

## 2018-03-22 DIAGNOSIS — Z23 Encounter for immunization: Secondary | ICD-10-CM | POA: Diagnosis not present

## 2018-03-22 NOTE — Progress Notes (Signed)
Shingrix given and patient tolerated well.  

## 2018-03-29 DIAGNOSIS — E669 Obesity, unspecified: Secondary | ICD-10-CM | POA: Diagnosis not present

## 2018-03-29 DIAGNOSIS — Z713 Dietary counseling and surveillance: Secondary | ICD-10-CM | POA: Diagnosis not present

## 2018-04-01 DIAGNOSIS — M5416 Radiculopathy, lumbar region: Secondary | ICD-10-CM | POA: Diagnosis not present

## 2018-04-06 DIAGNOSIS — E669 Obesity, unspecified: Secondary | ICD-10-CM | POA: Diagnosis not present

## 2018-04-06 DIAGNOSIS — E8881 Metabolic syndrome: Secondary | ICD-10-CM | POA: Diagnosis not present

## 2018-04-06 DIAGNOSIS — I10 Essential (primary) hypertension: Secondary | ICD-10-CM | POA: Diagnosis not present

## 2018-04-12 DIAGNOSIS — M545 Low back pain: Secondary | ICD-10-CM | POA: Diagnosis not present

## 2018-04-22 DIAGNOSIS — M5416 Radiculopathy, lumbar region: Secondary | ICD-10-CM | POA: Diagnosis not present

## 2018-05-13 DIAGNOSIS — M5126 Other intervertebral disc displacement, lumbar region: Secondary | ICD-10-CM | POA: Diagnosis not present

## 2018-05-14 ENCOUNTER — Encounter: Payer: Self-pay | Admitting: Family Medicine

## 2018-05-14 ENCOUNTER — Other Ambulatory Visit: Payer: Self-pay | Admitting: *Deleted

## 2018-05-14 MED ORDER — OMEPRAZOLE 40 MG PO CPDR: 40.0000 mg | DELAYED_RELEASE_CAPSULE | Freq: Every day | ORAL | 3 refills | Status: DC

## 2018-05-24 ENCOUNTER — Other Ambulatory Visit: Payer: Self-pay | Admitting: Family

## 2018-05-24 ENCOUNTER — Other Ambulatory Visit: Payer: Self-pay | Admitting: Family Medicine

## 2018-05-24 MED ORDER — OSELTAMIVIR PHOSPHATE 75 MG PO CAPS: 75.0000 mg | ORAL_CAPSULE | Freq: Every day | ORAL | 0 refills | Status: DC

## 2018-05-24 NOTE — Telephone Encounter (Signed)
Needs to be seen for antibiotics.

## 2018-05-24 NOTE — Progress Notes (Signed)
Pt's wife positive for Flu A. Tamiflu Prescription sent to pharmacy

## 2018-06-10 ENCOUNTER — Telehealth: Payer: Self-pay | Admitting: *Deleted

## 2018-06-10 DIAGNOSIS — M2021 Hallux rigidus, right foot: Secondary | ICD-10-CM | POA: Insufficient documentation

## 2018-06-10 DIAGNOSIS — M79674 Pain in right toe(s): Secondary | ICD-10-CM | POA: Diagnosis not present

## 2018-06-10 MED ORDER — FEBUXOSTAT 40 MG PO TABS: 40.0000 mg | ORAL_TABLET | Freq: Every day | ORAL | 3 refills | Status: DC

## 2018-06-10 MED ORDER — VITAMIN D (ERGOCALCIFEROL) 1.25 MG (50000 UNIT) PO CAPS: 50000.0000 [IU] | ORAL_CAPSULE | ORAL | 3 refills | Status: DC

## 2018-06-10 NOTE — Telephone Encounter (Signed)
Refill sent in for Vitamin D and [facility]ic [facility]

## 2018-06-14 ENCOUNTER — Ambulatory Visit (INDEPENDENT_AMBULATORY_CARE_PROVIDER_SITE_OTHER): Payer: BLUE CROSS/BLUE SHIELD | Admitting: *Deleted

## 2018-06-14 DIAGNOSIS — Z23 Encounter for immunization: Secondary | ICD-10-CM | POA: Diagnosis not present

## 2018-06-14 NOTE — Progress Notes (Signed)
Pt given 2nd Shingrix vaccine Tolerated well 

## 2018-06-15 DIAGNOSIS — E8881 Metabolic syndrome: Secondary | ICD-10-CM | POA: Diagnosis not present

## 2018-06-15 DIAGNOSIS — E781 Pure hyperglyceridemia: Secondary | ICD-10-CM | POA: Diagnosis not present

## 2018-06-15 DIAGNOSIS — E669 Obesity, unspecified: Secondary | ICD-10-CM | POA: Diagnosis not present

## 2018-06-15 DIAGNOSIS — Z6833 Body mass index (BMI) 33.0-33.9, adult: Secondary | ICD-10-CM | POA: Diagnosis not present

## 2018-06-28 ENCOUNTER — Ambulatory Visit: Payer: BLUE CROSS/BLUE SHIELD | Admitting: Family Medicine

## 2018-06-29 ENCOUNTER — Other Ambulatory Visit: Payer: Self-pay | Admitting: Family Medicine

## 2018-06-29 ENCOUNTER — Encounter: Payer: Self-pay | Admitting: Family Medicine

## 2018-06-29 ENCOUNTER — Ambulatory Visit: Payer: BLUE CROSS/BLUE SHIELD | Admitting: Family Medicine

## 2018-06-29 VITALS — BP 128/88 | HR 75 | Temp 97.1°F | Ht 70.5 in | Wt 237.0 lb

## 2018-06-29 DIAGNOSIS — I10 Essential (primary) hypertension: Secondary | ICD-10-CM

## 2018-06-29 DIAGNOSIS — R5383 Other fatigue: Secondary | ICD-10-CM

## 2018-06-29 DIAGNOSIS — R0683 Snoring: Secondary | ICD-10-CM

## 2018-06-29 DIAGNOSIS — M109 Gout, unspecified: Secondary | ICD-10-CM | POA: Diagnosis not present

## 2018-06-29 DIAGNOSIS — R030 Elevated blood-pressure reading, without diagnosis of hypertension: Secondary | ICD-10-CM

## 2018-06-29 DIAGNOSIS — Z6834 Body mass index (BMI) 34.0-34.9, adult: Secondary | ICD-10-CM

## 2018-06-29 DIAGNOSIS — J301 Allergic rhinitis due to pollen: Secondary | ICD-10-CM

## 2018-06-29 DIAGNOSIS — H6122 Impacted cerumen, left ear: Secondary | ICD-10-CM

## 2018-06-29 MED ORDER — LISINOPRIL 10 MG PO TABS: 10.0000 mg | ORAL_TABLET | Freq: Every day | ORAL | 3 refills | Status: DC

## 2018-06-29 NOTE — Patient Instructions (Addendum)
  We will do a referral back to the sleep apnea physician to further evaluate this Please follow-up with Dr. Victorino Dike for the toe problem Please continue to follow through with weight management Start lisinopril 10 mg 1 daily and check BMP in 4 weeks with blood pressure. Have weight management also check blood pressure Watch sodium intake Continue to drink lots of water Use nasal saline in each nostril and use Flonase regularly 1 spray each nostril at bedtime

## 2018-06-29 NOTE — Progress Notes (Signed)
Subjective:    Patient ID: Scott Rasmussen, male    DOB: [birth year], 53 y.o.   MRN: [id number]  HPI Patient here today for follow up on gout and elevated BP.  Patient brings in blood pressure readings for review today.  The diastolics are running in the low 75Z and the systolics sometimes in the 140 range.  He is being followed regularly for his feet by Dr. Doran Durand.  He questions whether he may have gout or not.  He is taking Uloric.  The blood pressure by the nurse in the office was good at 129/77.  He does have an increased BMI at 34.9.  The weight is 237.  He has hyperlipidemia.  He also has sleep apnea.  He is on omeprazole for his stomach.  The patient denies any chest pain or shortness of breath.  He does have some headaches the past couple of days.  He is going to wait management with [facility] in [place].  He has lost some weight and he continues to work on this and sees them once monthly.  They have encouraged him to take blood pressure medicine but he wanted to wait.  2 readings today had high diastolic readings.  He denies any trouble with his stomach or any trouble with passing his water.    Patient Active Problem List   Diagnosis Date Noted  . Thoracic aortic atherosclerosis ([place]) 12/05/2015  . Depression 06/01/2015  . Allergic rhinitis 08/15/2014  . Abnormal liver function test 11/08/2013  . Obesity (BMI 30.0-34.9) 01/03/2013  . Gout 11/26/2012  . Hyperlipidemia 11/26/2012  . GERD (gastroesophageal reflux disease) 11/26/2012   Outpatient Encounter Medications as of 06/29/2018  Medication Sig  . febuxostat (ULORIC) 40 MG tablet Take 1 tablet (40 mg total) by mouth daily.  Vanessa Kick Ethyl 1 g CAPS Take 2 capsules (2 g total) by mouth 2 (two) times daily.  Marland Kitchen omeprazole (PRILOSEC) 40 MG capsule Take 1 capsule (40 mg total) by mouth daily.  . Vitamin D, Ergocalciferol, (DRISDOL) 1.25 MG (50000 UT) CAPS capsule Take 1 capsule (50,000 Units total) by mouth every 7 (seven) days.   . Fluocinonide Emulsified Base 0.05 % CREA APPLY ONE APPLICATION TOPICALLY TWICE DAILY (Patient not taking: Reported on 02/23/2018)  . fluocinonide-emollient (LIDEX-E) 0.05 % cream Apply 1 application topically 2 (two) times daily. (Patient not taking: Reported on 02/23/2018)  . fluticasone (FLONASE) 50 MCG/ACT nasal spray Place 2 sprays into both nostrils daily. (Patient not taking: Reported on 02/23/2018)  . hydrocortisone (ANUSOL-HC) 2.5 % rectal cream Place 1 application rectally 2 (two) times daily. (Patient not taking: Reported on 06/29/2018)  . loratadine (CLARITIN) 10 MG tablet Take 1 tablet (10 mg total) by mouth daily. (Patient not taking: Reported on 02/23/2018)  . [DISCONTINUED] oseltamivir (TAMIFLU) 75 MG capsule Take 1 capsule (75 mg total) by mouth daily.  . [DISCONTINUED] trimethoprim-polymyxin b (POLYTRIM) ophthalmic solution Place 1-2 drops into the left eye 3 (three) times daily. For 7 days (Patient not taking: Reported on 02/23/2018)   No facility-administered encounter medications on file as of 06/29/2018.      Review of Systems  Constitutional: Negative.   HENT: Negative.   Eyes: Negative.   Respiratory: Negative.   Cardiovascular: Negative.        Elevated BP numbers at home   Gastrointestinal: Negative.   Endocrine: Negative.   Genitourinary: Negative.   Musculoskeletal: Negative.        Right great toe - Halluxrigiais.   Skin:  Negative.   Allergic/Immunologic: Negative.   Neurological: Negative.   Hematological: Negative.   Psychiatric/Behavioral: Negative.        Objective:   Physical Exam Vitals signs and nursing note reviewed.  Constitutional:      General: He is not in acute distress.    Appearance: Normal appearance. He is well-developed. He is obese.  HENT:     Head: Normocephalic and atraumatic.     Right Ear: Tympanic membrane, ear canal and external ear normal. There is no impacted cerumen.     Left Ear: Tympanic membrane, ear canal and external  ear normal. There is impacted cerumen.     Nose: Congestion present.     Mouth/Throat:     Mouth: Mucous membranes are moist.     Pharynx: Oropharynx is clear. No oropharyngeal exudate.  Eyes:     General: No scleral icterus.       Right eye: No discharge.        Left eye: No discharge.     Extraocular Movements: Extraocular movements intact.     Conjunctiva/sclera: Conjunctivae normal.     Pupils: Pupils are equal, round, and reactive to light.  Neck:     Musculoskeletal: Normal range of motion and neck supple.     Thyroid: No thyromegaly.     Vascular: No carotid bruit.     Trachea: No tracheal deviation.     Comments: No bruits thyromegaly or anterior cervical adenopathy Cardiovascular:     Rate and Rhythm: Normal rate and regular rhythm.     Heart sounds: Normal heart sounds. No murmur.     Comments: The heart was regular at 72/min with good pedal pulses and no edema Pulmonary:     Effort: Pulmonary effort is normal.     Breath sounds: Normal breath sounds. No wheezing, rhonchi or rales.  Abdominal:     General: Bowel sounds are normal.     Palpations: Abdomen is soft. There is no mass.     Tenderness: There is no abdominal tenderness.  Musculoskeletal: Normal range of motion.        General: No tenderness.     Right lower leg: No edema.     Left lower leg: No edema.  Lymphadenopathy:     Cervical: No cervical adenopathy.  Skin:    General: Skin is warm and dry.     Findings: No rash.  Neurological:     Mental Status: He is alert and oriented to person, place, and time.     Cranial Nerves: No cranial nerve deficit.     Deep Tendon Reflexes: Reflexes are normal and symmetric. Reflexes normal.  Psychiatric:        Mood and Affect: Mood normal.        Behavior: Behavior normal.        Thought Content: Thought content normal.        Judgment: Judgment normal.     Comments: Mood affect and behavior for this patient are normal for him with some increased anxiousness as  usual.    BP 129/77 (BP Location: Left Arm)   Pulse 75   Temp (!) 97.1 F (36.2 C) (Oral)   Ht 5' 10.5" (1.791 m)   Wt 237 lb (107.5 kg)   BMI 33.53 kg/m   Repeat blood pressure with large cuff was 128/88 in the right arm with the patient sitting and 130/98 in the left arm with the patient sitting      Assessment & Plan:  1. Gout, unspecified cause, unspecified chronicity, unspecified site -Check uric acid next visit  2. Elevated blood pressure reading -Patient's readings have been running too high including home readings and readings at weight management.  Start lisinopril 10 mg 1 daily. - BMP8+EGFR; Future  3. Other fatigue -Evaluate for sleep apnea - Ambulatory referral to Pulmonology  4. Snoring -Evaluate for sleep apnea - Ambulatory referral to Pulmonology  5. Excessive cerumen in left ear canal -Ear irrigation to remove cerumen next to the TM  6. BMI 34.0-34.9,adult -Continue with weight management  7. Seasonal allergic rhinitis due to pollen -Flonase 1 spray each nostril daily at bedtime  8. Essential hypertension -Start lisinopril 10 mg daily and check BMP in 3 to 4 weeks.  Meds ordered this encounter  Medications  . lisinopril (PRINIVIL,ZESTRIL) 10 MG tablet    Sig: Take 1 tablet (10 mg total) by mouth daily.    Dispense:  90 tablet    Refill:  3   Patient Instructions   We will do a referral back to the sleep apnea physician to further evaluate this Please follow-up with Dr. Doran Durand for the toe problem Please continue to follow through with weight management Start lisinopril 10 mg 1 daily and check BMP in 4 weeks with blood pressure. Have weight management also check blood pressure Watch sodium intake Continue to drink lots of water Use nasal saline in each nostril and use Flonase regularly 1 spray each nostril at bedtime   Arrie Senate MD

## 2018-06-30 ENCOUNTER — Telehealth: Payer: Self-pay | Admitting: Family Medicine

## 2018-06-30 MED ORDER — ICOSAPENT ETHYL 1 G PO CAPS: 2.0000 g | ORAL_CAPSULE | Freq: Two times a day (BID) | ORAL | 3 refills | Status: DC

## 2018-06-30 MED ORDER — DOXYCYCLINE HYCLATE 100 MG PO TABS: 100.0000 mg | ORAL_TABLET | Freq: Two times a day (BID) | ORAL | 1 refills | Status: DC

## 2018-06-30 NOTE — Telephone Encounter (Signed)
reflls sent to [facility]. Pt aware

## 2018-06-30 NOTE — Telephone Encounter (Signed)
PT was seen yesterday and states that Scott Rasmussen was suppose to send in doxycycline to pharmacy but it was never sent [facility] in [place]   Also wants to know if Scott Rasmussen can send in his vascepa to [facility]

## 2018-07-01 DIAGNOSIS — E669 Obesity, unspecified: Secondary | ICD-10-CM | POA: Diagnosis not present

## 2018-07-01 DIAGNOSIS — Z713 Dietary counseling and surveillance: Secondary | ICD-10-CM | POA: Diagnosis not present

## 2018-07-01 DIAGNOSIS — Z6833 Body mass index (BMI) 33.0-33.9, adult: Secondary | ICD-10-CM | POA: Diagnosis not present

## 2018-07-07 ENCOUNTER — Telehealth: Payer: Self-pay | Admitting: Family Medicine

## 2018-07-07 NOTE — Telephone Encounter (Signed)
Order has been placed and patient aware

## 2018-07-19 ENCOUNTER — Telehealth: Payer: Self-pay | Admitting: Family Medicine

## 2018-07-21 MED ORDER — AZITHROMYCIN 250 MG PO TABS: ORAL_TABLET | ORAL | 0 refills | Status: DC

## 2018-07-21 NOTE — Telephone Encounter (Signed)
May refill Z-Pak

## 2018-07-21 NOTE — Telephone Encounter (Signed)
May refill Z-Pak x1

## 2018-07-21 NOTE — Telephone Encounter (Signed)
Pt called and aware

## 2018-08-16 DIAGNOSIS — Z029 Encounter for administrative examinations, unspecified: Secondary | ICD-10-CM

## 2018-08-24 DIAGNOSIS — M5136 Other intervertebral disc degeneration, lumbar region: Secondary | ICD-10-CM | POA: Insufficient documentation

## 2018-09-01 ENCOUNTER — Other Ambulatory Visit: Payer: Self-pay | Admitting: *Deleted

## 2018-09-01 MED ORDER — LORATADINE 10 MG PO TABS: 10.0000 mg | ORAL_TABLET | Freq: Every day | ORAL | 3 refills | Status: DC

## 2018-09-06 ENCOUNTER — Ambulatory Visit (INDEPENDENT_AMBULATORY_CARE_PROVIDER_SITE_OTHER): Payer: BLUE CROSS/BLUE SHIELD | Admitting: Family Medicine

## 2018-09-06 ENCOUNTER — Other Ambulatory Visit: Payer: Self-pay

## 2018-09-06 ENCOUNTER — Encounter: Payer: Self-pay | Admitting: Family Medicine

## 2018-09-06 DIAGNOSIS — K219 Gastro-esophageal reflux disease without esophagitis: Secondary | ICD-10-CM | POA: Diagnosis not present

## 2018-09-06 DIAGNOSIS — R0683 Snoring: Secondary | ICD-10-CM

## 2018-09-06 DIAGNOSIS — I10 Essential (primary) hypertension: Secondary | ICD-10-CM | POA: Diagnosis not present

## 2018-09-06 DIAGNOSIS — E782 Mixed hyperlipidemia: Secondary | ICD-10-CM | POA: Diagnosis not present

## 2018-09-06 DIAGNOSIS — I700 Atherosclerosis of aorta: Secondary | ICD-10-CM | POA: Diagnosis not present

## 2018-09-06 DIAGNOSIS — J301 Allergic rhinitis due to pollen: Secondary | ICD-10-CM

## 2018-09-06 DIAGNOSIS — E559 Vitamin D deficiency, unspecified: Secondary | ICD-10-CM | POA: Diagnosis not present

## 2018-09-06 DIAGNOSIS — R030 Elevated blood-pressure reading, without diagnosis of hypertension: Secondary | ICD-10-CM

## 2018-09-06 DIAGNOSIS — M109 Gout, unspecified: Secondary | ICD-10-CM

## 2018-09-06 DIAGNOSIS — R5383 Other fatigue: Secondary | ICD-10-CM | POA: Diagnosis not present

## 2018-09-06 NOTE — Progress Notes (Signed)
Virtual Visit Via telephone Note I connected with@ on 09/06/18 by telephone and verified that I am speaking with the correct person or authorized healthcare agent using two identifiers. Scott Rasmussen is currently located at home and there are no unauthorized people in close proximity. I completed this visit while in a private location in my home .  This visit type was conducted due to national recommendations for restrictions regarding the COVID-19 Pandemic (e.g. social distancing).  This format is felt to be most appropriate for this patient at this time.  All issues noted in this document were discussed and addressed.  No physical exam was performed.    I discussed the limitations, risks, security and privacy concerns of performing an evaluation and management service by telephone and the availability of in person appointments. I also discussed with the patient that there may be a patient responsible charge related to this service. The patient expressed understanding and agreed to proceed.   Date:  09/06/2018    ID:  Scott Rasmussen      Sep 19, 1965        [id number]   Patient Care Team Patient Care Team: Ernestina Penna, MD as PCP - General (Family Medicine)  Reason for Visit: Primary Care Follow-up     History of Present Illness & Review of Systems:     Scott Rasmussen is a 53 y.o. year old male primary care patient that presents today for a telehealth visit.  The patient has been doing well overall.  When he started his lisinopril 10 mg he felt like that it caused tingling over his body and reduce this to 5 mg and he is felt better and his blood pressures have been good.  Today it was 127/81.  His weight has come down about 14 to 15 pounds over the past several months with more attention to diet and exercise.  His body mass index is 33.  He denies any chest pain pressure tightness or shortness of breath.  He denies any trouble with his stomach and as long as he is taking the  Prilosec this seems to control his reflux.  He denies any blood in the stool or black tarry bowel movements.  He is passing his water well.  He does get physical exams at work with [employer] and recently had a sleep apnea evaluation monitored by them which was reported as negative for sleep apnea at this time.  He does not relate any history of any gout attacks.  He is still on Uloric.  Other significant history is that he had a coronary calcium score in August 2015 that was 0.  He had a colonoscopy in April of last year that showed diverticulosis and indicated he would not need another colonoscopy for 10 years.  He has been taking his Vascepa at the recommended dose.  Review of systems as stated, otherwise negative.  The patient does not have symptoms concerning for COVID-19 infection (fever, chills, cough, or new shortness of breath).      Current Medications (Verified) Allergies as of 09/06/2018      Reactions   Depo-medrol [methylprednisolone Acetate] Rash      Medication List       Accurate as of Sep 06, 2018  8:27 AM. If you have any questions, ask your nurse or doctor.        azithromycin 250 MG tablet Commonly known as:  ZITHROMAX As directed   doxycycline 100 MG tablet  Commonly known as:  VIBRA-TABS Take 1 tablet (100 mg total) by mouth 2 (two) times daily. 1 po bid   febuxostat 40 MG tablet Commonly known as:  Uloric Take 1 tablet (40 mg total) by mouth daily.   fluocinonide-emollient 0.05 % cream Commonly known as:  LIDEX-E Apply 1 application topically 2 (two) times daily.   Fluocinonide Emulsified Base 0.05 % Crea APPLY ONE APPLICATION TOPICALLY TWICE DAILY   fluticasone 50 MCG/ACT nasal spray Commonly known as:  FLONASE Place 2 sprays into both nostrils daily.   hydrocortisone 2.5 % rectal cream Commonly known as:  ANUSOL-HC Place 1 application rectally 2 (two) times daily.   Icosapent Ethyl 1 g Caps Take 2 capsules (2 g total) by mouth 2 (two)  times daily.   lisinopril 10 MG tablet Commonly known as:  ZESTRIL Take 1 tablet (10 mg total) by mouth daily.   loratadine 10 MG tablet Commonly known as:  CLARITIN Take 1 tablet (10 mg total) by mouth daily.   omeprazole 40 MG capsule Commonly known as:  PRILOSEC Take 1 capsule (40 mg total) by mouth daily.   Vitamin D (Ergocalciferol) 1.25 MG (50000 UT) Caps capsule Commonly known as:  DRISDOL Take 1 capsule (50,000 Units total) by mouth every 7 (seven) days.           Allergies (Verified)    Depo-medrol [methylprednisolone acetate]  Past Medical History Past Medical History:  Diagnosis Date  . Gout   . Hyperlipidemia   . Shortness of breath   . Sleep apnea    had sx   . Umbilical hernia      Past Surgical History:  Procedure Laterality Date  . TONSILLECTOMY AND ADENOIDECTOMY  2008  . UVULOPALATOPHARYNGOPLASTY  2008    Social History   Socioeconomic History  . Marital status: Married    Spouse name: Not on file  . Number of children: Not on file  . Years of education: Not on file  . Highest education level: Not on file  Occupational History  . Not on file  Social Needs  . Financial resource strain: Not on file  . Food insecurity:    Worry: Not on file    Inability: Not on file  . Transportation needs:    Medical: Not on file    Non-medical: Not on file  Tobacco Use  . Smoking status: Former Games developer  . Smokeless tobacco: Never Used  Substance and Sexual Activity  . Alcohol use: Yes    Comment: 2 drinks per month per pt.  . Drug use: No  . Sexual activity: Not on file  Lifestyle  . Physical activity:    Days per week: Not on file    Minutes per session: Not on file  . Stress: Not on file  Relationships  . Social connections:    Talks on phone: Not on file    Gets together: Not on file    Attends religious service: Not on file    Active member of club or organization: Not on file    Attends meetings of clubs or organizations: Not on file     Relationship status: Not on file  Other Topics Concern  . Not on file  Social History Narrative  . Not on file     Family History  Problem Relation Age of Onset  . Heart disease Father        heart attack  . Colon cancer Neg Hx   . Esophageal cancer Neg Hx   .  Rectal cancer Neg Hx   . Stomach cancer Neg Hx       Labs/Other Tests and Data Reviewed:    Wt Readings from Last 3 Encounters:  06/29/18 237 lb (107.5 kg)  02/23/18 241 lb (109.3 kg)  10/08/17 241 lb 6.4 oz (109.5 kg)   Temp Readings from Last 3 Encounters:  06/29/18 (!) 97.1 F (36.2 C) (Oral)  02/23/18 97.6 F (36.4 C) (Oral)  10/08/17 (!) 97.5 F (36.4 C) (Oral)   BP Readings from Last 3 Encounters:  06/29/18 128/88  02/23/18 120/81  10/08/17 135/90   Pulse Readings from Last 3 Encounters:  06/29/18 75  02/23/18 79  10/08/17 92     No results found for: HGBA1C Lab Results  Component Value Date   LDLCALC 90 07/02/2017   CREATININE 1.11 07/02/2017       Chemistry      Component Value Date/Time   NA 143 07/02/2017 0938   K 4.5 07/02/2017 0938   CL 104 07/02/2017 0938   CO2 24 07/02/2017 0938   BUN 18 07/02/2017 0938   CREATININE 1.11 07/02/2017 0938      Component Value Date/Time   CALCIUM 9.9 07/02/2017 0938   ALKPHOS 72 07/02/2017 0938   AST 36 07/02/2017 0938   ALT 51 (H) 07/02/2017 0938   BILITOT 0.3 07/02/2017 0938         OBSERVATIONS/ OBJECTIVE:     The patient is alert and feeling well.  His body mass index is 33.  His weight is 231.  His blood pressure this morning was 127/81.  He is due to get a chest x-ray and lab work.  We will arrange to get the chest x-ray and a PSA test along with a rectal exam sometime this fall.  Physical exam deferred due to nature of telephonic visit.  ASSESSMENT & PLAN    Time:   Today, I have spent 22 minutes with the patient via telephone discussing the above including Covid precautions.     Visit Diagnoses: 1. Elevated blood  pressure reading -Blood pressures have been better with lisinopril 10 mg 1/2 pill daily.  He will continue at this dosage range along with watching sodium intake and trying to achieve more weight loss.  I did remind him that if the numbers go up that the lisinopril should be increased back to 10 mg.  2. Snoring -Patient did have sleep apnea evaluation by his company of employment and this was reported to him as negative for sleep apnea.  3. Vitamin D deficiency -Continue with vitamin D replacement pending results of lab work  4. Essential hypertension -Continue with lisinopril 5 mg daily in conjunction with sodium restriction and weight loss  5. Seasonal allergic rhinitis due to pollen -Continue with Flonase  6. Gout, unspecified cause, unspecified chronicity, unspecified site -Continue with uric acid lowering agent or Uloric  7. Thoracic aortic atherosclerosis ([facility]) -Continue with aggressive therapeutic lifestyle changes statin therapy, Vascepa, weight loss with diet and exercise  8. Mixed hyperlipidemia -Continue with aggressive therapeutic lifestyle changes  9. Gastroesophageal reflux disease, esophagitis presence not specified -Continue with omeprazole  Patient Instructions  Continue to practice good hand and respiratory hygiene Continue to work aggressively on weight loss with diet and exercise Remember that you will need another colonoscopy in 2029 Remember that you will need a chest x-ray this fall along with a PSA and rectal exam Continue to drink plenty of fluids and stay well-hydrated and avoid caffeine as much as  possible     The above assessment and management plan was discussed with the patient. The patient verbalized understanding of and has agreed to the management plan. Patient is aware to call the clinic if symptoms persist or worsen. Patient is aware when to return to the clinic for a follow-up visit. Patient educated on when it is appropriate to go to the  emergency department.    Ernestina Penna, MD [facility] [address] [place], [place], [place] [address] Ph [phone]   Nyra Capes MD

## 2018-09-06 NOTE — Patient Instructions (Signed)
Continue to practice good hand and respiratory hygiene Continue to work aggressively on weight loss with diet and exercise Remember that you will need another colonoscopy in 2029 Remember that you will need a chest x-ray this fall along with a PSA and rectal exam Continue to drink plenty of fluids and stay well-hydrated and avoid caffeine as much as possible

## 2018-09-06 NOTE — Addendum Note (Signed)
Addended by: Margorie John on: 09/06/2018 09:06 AM   Modules accepted: Orders

## 2018-09-06 NOTE — Addendum Note (Signed)
Addended by: Magdalene River on: 09/06/2018 08:56 AM   Modules accepted: Orders

## 2018-09-07 ENCOUNTER — Ambulatory Visit: Payer: BLUE CROSS/BLUE SHIELD | Admitting: Family Medicine

## 2018-09-07 LAB — BMP8+EGFR
BUN/Creatinine Ratio: 18 (ref 9–20)
BUN: 20 mg/dL (ref 6–24)
CO2: 22 mmol/L (ref 20–29)
Calcium: 9.5 mg/dL (ref 8.7–10.2)
Chloride: 103 mmol/L (ref 96–106)
Creatinine, Ser: 1.11 mg/dL (ref 0.76–1.27)
GFR calc Af Amer: 88 mL/min/{1.73_m2} (ref >59)
GFR calc non Af Amer: 76 mL/min/{1.73_m2} (ref >59)
Glucose: 96 mg/dL (ref 65–99)
Potassium: 4.1 mmol/L (ref 3.5–5.2)
Sodium: 138 mmol/L (ref 134–144)

## 2018-09-07 LAB — CBC WITH DIFFERENTIAL/PLATELET
Basophils Absolute: 0.1 10*3/uL (ref 0.0–0.2)
Basos: 1 %
EOS (ABSOLUTE): 0.3 10*3/uL (ref 0.0–0.4)
Eos: 5 %
Hematocrit: 47.8 % (ref 37.5–51.0)
Hemoglobin: 16.9 g/dL (ref 13.0–17.7)
Immature Grans (Abs): 0 10*3/uL (ref 0.0–0.1)
Immature Granulocytes: 0 %
Lymphocytes Absolute: 2 10*3/uL (ref 0.7–3.1)
Lymphs: 36 %
MCH: 29.7 pg (ref 26.6–33.0)
MCHC: 35.4 g/dL (ref 31.5–35.7)
MCV: 84 fL (ref 79–97)
Monocytes Absolute: 0.4 10*3/uL (ref 0.1–0.9)
Monocytes: 8 %
Neutrophils Absolute: 2.8 10*3/uL (ref 1.4–7.0)
Neutrophils: 50 %
Platelets: 279 10*3/uL (ref 150–450)
RBC: 5.69 x10E6/uL (ref 4.14–5.80)
RDW: 12.7 % (ref 11.6–15.4)
WBC: 5.5 10*3/uL (ref 3.4–10.8)

## 2018-09-07 LAB — LIPID PANEL
Chol/HDL Ratio: 4.4 ratio (ref 0.0–5.0)
Cholesterol, Total: 169 mg/dL (ref 100–199)
HDL: 38 mg/dL — ABNORMAL LOW (ref >39)
LDL Calculated: 97 mg/dL (ref 0–99)
Triglycerides: 169 mg/dL — ABNORMAL HIGH (ref 0–149)
VLDL Cholesterol Cal: 34 mg/dL (ref 5–40)

## 2018-09-07 LAB — IRON: Iron: 115 ug/dL (ref 38–169)

## 2018-09-07 LAB — URIC ACID: Uric Acid: 6.3 mg/dL (ref 3.7–8.6)

## 2018-09-07 LAB — HEPATIC FUNCTION PANEL
ALT: 32 IU/L (ref 0–44)
AST: 26 IU/L (ref 0–40)
Albumin: 4.7 g/dL (ref 3.8–4.9)
Alkaline Phosphatase: 66 IU/L (ref 39–117)
Bilirubin Total: 0.6 mg/dL (ref 0.0–1.2)
Bilirubin, Direct: 0.15 mg/dL (ref 0.00–0.40)
Total Protein: 6.6 g/dL (ref 6.0–8.5)

## 2018-09-07 LAB — VITAMIN D 25 HYDROXY (VIT D DEFICIENCY, FRACTURES): Vit D, 25-Hydroxy: 50.5 ng/mL (ref 30.0–100.0)

## 2018-09-30 DIAGNOSIS — M5136 Other intervertebral disc degeneration, lumbar region: Secondary | ICD-10-CM | POA: Diagnosis not present

## 2018-10-07 ENCOUNTER — Encounter: Payer: Self-pay | Admitting: *Deleted

## 2018-10-11 ENCOUNTER — Other Ambulatory Visit: Payer: Self-pay | Admitting: Family Medicine

## 2018-10-11 DIAGNOSIS — M2022 Hallux rigidus, left foot: Secondary | ICD-10-CM | POA: Diagnosis not present

## 2018-10-11 DIAGNOSIS — M2020 Hallux rigidus, unspecified foot: Secondary | ICD-10-CM

## 2018-10-11 HISTORY — DX: Hallux rigidus, unspecified foot: M20.20

## 2018-10-11 MED ORDER — FLUTICASONE PROPIONATE 50 MCG/ACT NA SUSP: 2.0000 | Freq: Every day | NASAL | 3 refills | Status: DC

## 2018-10-11 NOTE — Telephone Encounter (Signed)
What is the name of the medication? Flonase   Have you contacted your pharmacy to request a refill? No   Which pharmacy would you like this sent to? [facility]   Patient notified that their request is being sent to the clinical staff for review and that they should receive a call once it is complete. If they do not receive a call within 24 hours they can check with their pharmacy or our office.

## 2018-10-11 NOTE — Telephone Encounter (Signed)
Pt aware refill sent to pharmacy 

## 2018-11-18 ENCOUNTER — Other Ambulatory Visit: Payer: Self-pay

## 2018-11-18 ENCOUNTER — Telehealth: Payer: Self-pay | Admitting: Family Medicine

## 2018-11-18 ENCOUNTER — Ambulatory Visit (INDEPENDENT_AMBULATORY_CARE_PROVIDER_SITE_OTHER): Payer: BC Managed Care – PPO | Admitting: Family

## 2018-11-18 ENCOUNTER — Encounter: Payer: Self-pay | Admitting: Family

## 2018-11-18 DIAGNOSIS — J0101 Acute recurrent maxillary sinusitis: Secondary | ICD-10-CM

## 2018-11-18 MED ORDER — AMOXICILLIN-POT CLAVULANATE 875-125 MG PO TABS: 1.0000 | ORAL_TABLET | Freq: Two times a day (BID) | ORAL | 0 refills | Status: DC

## 2018-11-18 NOTE — Progress Notes (Signed)
   Virtual Visit via telephone Note Due to COVID-19 pandemic this visit was conducted virtually. This visit type was conducted due to national recommendations for restrictions regarding the COVID-19 Pandemic (e.g. social distancing, sheltering in place) in an effort to limit this patient's exposure and mitigate transmission in our community. All issues noted in this document were discussed and addressed.  A physical exam was not performed with this format.  I connected with Scott Rasmussen on 11/18/18 at 5:04 pm by telephone and verified that I am speaking with the correct person using two identifiers. Scott Rasmussen [place] at [place]  and no one is currently with her during visit. The provider, Evelina Dun, FNP is located in their office at time of visit.  I discussed the limitations, risks, security and privacy concerns of performing an evaluation and management service by telephone and the availability of in person appointments. I also discussed with the patient that there may be a patient responsible charge related to this service. The patient expressed understanding and agreed to proceed.   History and Present Illness:  Sinusitis This is a recurrent problem. The current episode started 1 to 4 weeks ago. The problem is unchanged. There has been no fever. His pain is at a severity of 2/10. The pain is mild. Associated symptoms include congestion, coughing ("a little"), headaches and sinus pressure. Pertinent negatives include no ear pain, shortness of breath, sneezing or sore throat. Past treatments include oral decongestants. The treatment provided mild relief.     Review of Systems  HENT: Positive for congestion and sinus pressure. Negative for ear pain, sneezing and sore throat.   Respiratory: Positive for cough ("a little"). Negative for shortness of breath.   Neurological: Positive for headaches.  All other systems reviewed and are negative.     Observations/Objective: No SOB or distress noted  Assessment and Plan: 1. Acute recurrent maxillary sinusitis - Take meds as prescribed - Use a cool mist humidifier  -Use saline nose sprays frequently -Force fluids -For any cough or congestion  Use plain Mucinex- regular strength or max strength is fine -For fever or aces or pains- take tylenol or ibuprofen. -PT will go and get COVID testing - amoxicillin-clavulanate (AUGMENTIN) 875-125 MG tablet; Take 1 tablet by mouth 2 (two) times daily.  Dispense: 14 tablet; Refill: 0 - Novel Coronavirus, NAA ([facility])     I discussed the assessment and treatment plan with the patient. The patient was provided an opportunity to ask questions and all were answered. The patient agreed with the plan and demonstrated an understanding of the instructions.   The patient was advised to call back or seek an in-person evaluation if the symptoms worsen or if the condition fails to improve as anticipated.  The above assessment and management plan was discussed with the patient. The patient verbalized understanding of and has agreed to the management plan. Patient is aware to call the clinic if symptoms persist or worsen. Patient is aware when to return to the clinic for a follow-up visit. Patient educated on when it is appropriate to go to the emergency department.   Time call ended: 5:12 pm   I provided 8 minutes of non-face-to-face time during this encounter.    Evelina Dun, FNP

## 2018-11-18 NOTE — Telephone Encounter (Signed)
Needs appt

## 2018-11-22 ENCOUNTER — Other Ambulatory Visit: Payer: Self-pay

## 2018-11-22 DIAGNOSIS — R6889 Other general symptoms and signs: Secondary | ICD-10-CM | POA: Diagnosis not present

## 2018-11-22 DIAGNOSIS — Z20822 Contact with and (suspected) exposure to covid-19: Secondary | ICD-10-CM

## 2018-11-23 LAB — NOVEL CORONAVIRUS, NAA: SARS-CoV-2, NAA: NOT DETECTED

## 2018-12-17 ENCOUNTER — Telehealth: Payer: Self-pay | Admitting: Family

## 2018-12-17 ENCOUNTER — Ambulatory Visit: Payer: BC Managed Care – PPO | Admitting: Family Medicine

## 2019-01-06 ENCOUNTER — Other Ambulatory Visit: Payer: Self-pay

## 2019-01-07 ENCOUNTER — Ambulatory Visit (INDEPENDENT_AMBULATORY_CARE_PROVIDER_SITE_OTHER): Payer: BC Managed Care – PPO | Admitting: Family Medicine

## 2019-01-07 ENCOUNTER — Encounter: Payer: Self-pay | Admitting: Podiatry

## 2019-01-07 ENCOUNTER — Ambulatory Visit (INDEPENDENT_AMBULATORY_CARE_PROVIDER_SITE_OTHER): Payer: BC Managed Care – PPO

## 2019-01-07 ENCOUNTER — Ambulatory Visit: Payer: BC Managed Care – PPO | Admitting: Podiatry

## 2019-01-07 ENCOUNTER — Encounter: Payer: Self-pay | Admitting: Family Medicine

## 2019-01-07 ENCOUNTER — Other Ambulatory Visit: Payer: Self-pay | Admitting: Podiatry

## 2019-01-07 VITALS — BP 125/80 | HR 79 | Temp 99.0°F | Resp 20 | Ht 70.5 in | Wt 232.0 lb

## 2019-01-07 VITALS — BP 133/86 | HR 83 | Resp 16

## 2019-01-07 DIAGNOSIS — R209 Unspecified disturbances of skin sensation: Secondary | ICD-10-CM | POA: Diagnosis not present

## 2019-01-07 DIAGNOSIS — M722 Plantar fascial fibromatosis: Secondary | ICD-10-CM

## 2019-01-07 DIAGNOSIS — Z6832 Body mass index (BMI) 32.0-32.9, adult: Secondary | ICD-10-CM

## 2019-01-07 DIAGNOSIS — R2689 Other abnormalities of gait and mobility: Secondary | ICD-10-CM | POA: Diagnosis not present

## 2019-01-07 DIAGNOSIS — M79671 Pain in right foot: Secondary | ICD-10-CM

## 2019-01-07 DIAGNOSIS — M79674 Pain in right toe(s): Secondary | ICD-10-CM

## 2019-01-07 DIAGNOSIS — Z23 Encounter for immunization: Secondary | ICD-10-CM

## 2019-01-07 DIAGNOSIS — M79672 Pain in left foot: Secondary | ICD-10-CM | POA: Diagnosis not present

## 2019-01-07 DIAGNOSIS — IMO0001 Reserved for inherently not codable concepts without codable children: Secondary | ICD-10-CM

## 2019-01-07 DIAGNOSIS — M109 Gout, unspecified: Secondary | ICD-10-CM

## 2019-01-07 DIAGNOSIS — S46211A Strain of muscle, fascia and tendon of other parts of biceps, right arm, initial encounter: Secondary | ICD-10-CM

## 2019-01-07 MED ORDER — MELOXICAM 15 MG PO TABS: 15.0000 mg | ORAL_TABLET | Freq: Every day | ORAL | 0 refills | Status: DC

## 2019-01-07 NOTE — Patient Instructions (Signed)

## 2019-01-07 NOTE — Progress Notes (Signed)
Subjective:  Patient ID: Scott Rasmussen, male    DOB: 12/31/1965, 53 y.o.   MRN: [id number]  Patient Care Team: Sharion Balloon, FNP as PCP - General (Family Medicine)   Chief Complaint:  Gout (right great toe)   HPI: Scott Rasmussen is a 53 y.o. male presenting on 01/07/2019 for Gout (right great toe)   Pt presents today with pain in right great toe with slight swelling. Pt states he takes his Uloric on a daily basis. States the pain has improved and the redness has improved. Pt states over the last few months he has been experiencing numbness / tingling in his hands and feet. No known injury. States this can be worse in the mornings and improves throughout the day. Has started a new job where he is on his feet all day. He states at times is feels like pins and needles on the soles of his feet. No neck pain or stiffness. No neck injuries. He also reports pain in his right bicep. States this started about 2 months ago. States he only has pain with flexion, extension, and lifting heavier objects. No known injury, weakness, loss of function, or swelling. Has tried motrin with some relief of the pain. States 2/10 aching to shooting pain. He denies other associated symptoms.    Relevant past medical, surgical, family, and social history reviewed and updated as indicated.  Allergies and medications reviewed and updated. Date reviewed: Chart in Epic.   Past Medical History:  Diagnosis Date  . Gout   . Hallux rigidus 10/11/2018   right   . Hyperlipidemia   . Shortness of breath   . Sleep apnea    had sx   . Umbilical hernia     Past Surgical History:  Procedure Laterality Date  . TONSILLECTOMY AND ADENOIDECTOMY  2008  . UVULOPALATOPHARYNGOPLASTY  2008    Social History   Socioeconomic History  . Marital status: Married    Spouse name: Not on file  . Number of children: Not on file  . Years of education: Not on file  . Highest education level: Not on file  Occupational  History  . Not on file  Social Needs  . Financial resource strain: Not on file  . Food insecurity    Worry: Not on file    Inability: Not on file  . Transportation needs    Medical: Not on file    Non-medical: Not on file  Tobacco Use  . Smoking status: Former Research scientist (life sciences)  . Smokeless tobacco: Never Used  Substance and Sexual Activity  . Alcohol use: Yes    Comment: 2 drinks per month per pt.  . Drug use: No  . Sexual activity: Not on file  Lifestyle  . Physical activity    Days per week: Not on file    Minutes per session: Not on file  . Stress: Not on file  Relationships  . Social Herbalist on phone: Not on file    Gets together: Not on file    Attends religious service: Not on file    Active member of club or organization: Not on file    Attends meetings of clubs or organizations: Not on file    Relationship status: Not on file  . Intimate partner violence    Fear of current or ex partner: Not on file    Emotionally abused: Not on file    Physically abused: Not on file  Forced sexual activity: Not on file  Other Topics Concern  . Not on file  Social History Narrative  . Not on file    Outpatient Encounter Medications as of 01/07/2019  Medication Sig  . febuxostat (ULORIC) 40 MG tablet Take 1 tablet (40 mg total) by mouth daily.  . Fluocinonide Emulsified Base 0.05 % CREA APPLY ONE APPLICATION TOPICALLY TWICE DAILY  . Icosapent Ethyl 1 g CAPS Take 2 capsules (2 g total) by mouth 2 (two) times daily.  Marland Kitchen loratadine (CLARITIN) 10 MG tablet Take 1 tablet (10 mg total) by mouth daily.  Marland Kitchen omeprazole (PRILOSEC) 40 MG capsule Take 1 capsule (40 mg total) by mouth daily.  . Vitamin D, Ergocalciferol, (DRISDOL) 1.25 MG (50000 UT) CAPS capsule Take 1 capsule (50,000 Units total) by mouth every 7 (seven) days.  . fluticasone (FLONASE) 50 MCG/ACT nasal spray Place 2 sprays into both nostrils daily.  . hydrocortisone (ANUSOL-HC) 2.5 % rectal cream Place 1 application  rectally 2 (two) times daily. (Patient not taking: Reported on 01/07/2019)  . [DISCONTINUED] amoxicillin-clavulanate (AUGMENTIN) 875-125 MG tablet Take 1 tablet by mouth 2 (two) times daily.  . [DISCONTINUED] fluocinonide-emollient (LIDEX-E) 0.05 % cream Apply 1 application topically 2 (two) times daily. (Patient not taking: Reported on 02/23/2018)  . [DISCONTINUED] lisinopril (PRINIVIL,ZESTRIL) 10 MG tablet Take 1 tablet (10 mg total) by mouth daily.   No facility-administered encounter medications on file as of 01/07/2019.     Allergies  Allergen Reactions  . Depo-Medrol [Methylprednisolone Acetate] Rash    Review of Systems  Constitutional: Negative for activity change, appetite change, chills, diaphoresis, fatigue, fever and unexpected weight change.  HENT: Negative.   Eyes: Negative.   Respiratory: Negative for cough, chest tightness and shortness of breath.   Cardiovascular: Negative for chest pain, palpitations and leg swelling.  Gastrointestinal: Negative for blood in stool, constipation, diarrhea, nausea and vomiting.  Endocrine: Negative.   Genitourinary: Negative for decreased urine volume, difficulty urinating, dysuria, frequency and urgency.  Musculoskeletal: Positive for arthralgias, gait problem (antalgic gait due to right great toe pain), joint swelling and myalgias. Negative for back pain, neck pain and neck stiffness.  Skin: Negative.  Negative for color change, pallor and wound.  Allergic/Immunologic: Negative.   Neurological: Positive for numbness. Negative for dizziness, tremors, seizures, syncope, facial asymmetry, speech difficulty, weakness, light-headedness and headaches.  Hematological: Negative.   Psychiatric/Behavioral: Negative for confusion, hallucinations, sleep disturbance and suicidal ideas.  All other systems reviewed and are negative.       Objective:  BP 125/80   Pulse 79   Temp 99 F (37.2 C)   Resp 20   Ht 5' 10.5" (1.791 m)   Wt 232 lb  (105.2 kg)   SpO2 96%   BMI 32.82 kg/m    Wt Readings from Last 3 Encounters:  01/07/19 232 lb (105.2 kg)  06/29/18 237 lb (107.5 kg)  02/23/18 241 lb (109.3 kg)    Physical Exam Vitals signs and nursing note reviewed.  Constitutional:      General: He is not in acute distress.    Appearance: Normal appearance. He is well-developed and well-groomed. He is obese. He is not ill-appearing, toxic-appearing or diaphoretic.  HENT:     Head: Normocephalic and atraumatic.     Jaw: There is normal jaw occlusion.     Right Ear: Hearing normal.     Left Ear: Hearing normal.     Nose: Nose normal.     Mouth/Throat:  Lips: Pink.     Mouth: Mucous membranes are moist.     Pharynx: Oropharynx is clear. Uvula midline.  Eyes:     General: Lids are normal.     Extraocular Movements: Extraocular movements intact.     Conjunctiva/sclera: Conjunctivae normal.     Pupils: Pupils are equal, round, and reactive to light.  Neck:     Musculoskeletal: Normal range of motion and neck supple.     Thyroid: No thyroid mass, thyromegaly or thyroid tenderness.     Vascular: No carotid bruit or JVD.     Trachea: Trachea and phonation normal.  Cardiovascular:     Rate and Rhythm: Normal rate and regular rhythm.     Chest Wall: PMI is not displaced.     Pulses: Normal pulses.          Dorsalis pedis pulses are 2+ on the right side and 2+ on the left side.       Posterior tibial pulses are 2+ on the right side and 2+ on the left side.     Heart sounds: Normal heart sounds. No murmur. No friction rub. No gallop.   Pulmonary:     Effort: Pulmonary effort is normal. No respiratory distress.     Breath sounds: Normal breath sounds. No wheezing.  Abdominal:     General: Bowel sounds are normal. There is no distension or abdominal bruit.     Palpations: Abdomen is soft. There is no hepatomegaly or splenomegaly.     Tenderness: There is no abdominal tenderness. There is no right CVA tenderness or left CVA  tenderness.     Hernia: No hernia is present.  Musculoskeletal:     Right ankle: Normal.     Left ankle: Normal.     Cervical back: Normal.     Right upper arm: He exhibits tenderness. He exhibits no bony tenderness, no swelling, no edema, no deformity and no laceration.     Right forearm: Normal.       Arms:     Right lower leg: No edema.     Left lower leg: No edema.     Right foot: Normal range of motion and normal capillary refill. Tenderness present. No bony tenderness, swelling, crepitus, deformity or laceration.     Left foot: Normal. Normal range of motion. No deformity.       Feet:  Feet:     Right foot:     Protective Sensation: 10 sites tested. 10 sites sensed.     Skin integrity: Skin integrity normal.     Left foot:     Protective Sensation: 10 sites tested. 10 sites sensed.     Skin integrity: Skin integrity normal.  Lymphadenopathy:     Cervical: No cervical adenopathy.  Skin:    General: Skin is warm and dry.     Capillary Refill: Capillary refill takes less than 2 seconds.     Coloration: Skin is not cyanotic, jaundiced or pale.     Findings: No rash.  Neurological:     General: No focal deficit present.     Mental Status: He is alert and oriented to person, place, and time.     Cranial Nerves: Cranial nerves are intact.     Sensory: Sensation is intact.     Motor: Motor function is intact.     Coordination: Coordination is intact.     Gait: Gait is intact.     Comments: Strength 5/5 in all 4 extremities.  Psychiatric:        Attention and Perception: Attention and perception normal.        Mood and Affect: Mood and affect normal.        Speech: Speech normal.        Behavior: Behavior normal. Behavior is cooperative.        Thought Content: Thought content normal.        Cognition and Memory: Cognition and memory normal.        Judgment: Judgment normal.     Results for orders placed or performed in visit on 11/22/18  Novel Coronavirus, NAA  ([facility])   Specimen: Oropharyngeal(OP) collection in vial transport medium   OROPHARYNGEA  TESTING  Result Value Ref Range   SARS-CoV-2, NAA Not Detected Not Detected       Pertinent labs & imaging results that were available during my care of the patient were reviewed by me and considered in my medical decision making.  Assessment & Plan:  Ollin was seen today for gout.  Diagnoses and all orders for this visit:  Great toe pain, right Antalgic gait Right great toe pain. Swelling and redness has subsided. On preventative uloric daily. Has developed an antalgic gait due to right great toe pain. Will check uric acid level today. Report any new or worsening symptoms. Pt has follow up with ortho today.  -     Uric acid  Paresthesias/numbness Ongoing paresthesias of bilateral hands and bilateral feet. Will check below today to rule out potential underlying causes such as B12 deficiency, electrolyte imbalances, or diabetes. No loss of function or weakness. No injury. No neck pain or stiffness. No back pain or stiffness. Report any new or worsening symptoms. Has follow up with ortho today.  -     CMP14+EGFR -     Vitamin B12 -     Thyroid Panel With TSH -     CBC with Differential/Platelet  BMI 32.0-32.9,adult Diet and exercise discussed. Labs pending.  -     CMP14+EGFR -     Thyroid Panel With TSH -     CBC with Differential/Platelet  Strain of right biceps, initial encounter Symptomatic care discussed. Avoid aggravating activities. Rest, moist heat, and NSAIDs (Aleve twice daily for 2 weeks). Report any new or worsening symptoms.    Influenza vaccination given today.   Continue all other maintenance medications.  Follow up plan: Return if symptoms worsen or fail to improve.  Continue healthy lifestyle choices, including diet (rich in fruits, vegetables, and lean proteins, and low in salt and simple carbohydrates) and exercise (at least 30 minutes of moderate physical activity  daily).  Educational handout given for paresthesia   The above assessment and management plan was discussed with the patient. The patient verbalized understanding of and has agreed to the management plan. Patient is aware to call the clinic if they develop any new symptoms or if symptoms persist or worsen. Patient is aware when to return to the clinic for a follow-up visit. Patient educated on when it is appropriate to go to the emergency department.   Monia Pouch, FNP-C [facility] [phone]

## 2019-01-07 NOTE — Patient Instructions (Signed)
Paresthesia Paresthesia is a burning or prickling feeling. This feeling can happen in any part of the body. It often happens in the hands, arms, legs, or feet. Usually, it is not painful. In most cases, the feeling goes away in a short time and is not a sign of a serious problem. If you have paresthesia that lasts a long time, you may need to be seen by your doctor. Follow these instructions at home: Alcohol use   Do not drink alcohol if: ? Your doctor tells you not to drink. ? You are pregnant, may be pregnant, or are planning to become pregnant.  If you drink alcohol: ? Limit how much you use to:  0-1 drink a day for women.  0-2 drinks a day for men. ? Be aware of how much alcohol is in your drink. In the [place] one drink equals one 12 oz bottle of beer (355 mL), one 5 oz glass of wine (148 mL), or one 1 oz glass of hard liquor (44 mL). Nutrition   Eat a healthy diet. This includes: ? Eating foods that have a lot of fiber in them, such as fresh fruits and vegetables, whole grains, and beans. ? Limiting foods that have a lot of fat and processed sugars in them, such as fried or sweet foods. General instructions  Take over-the-counter and prescription medicines only as told by your doctor.  Do not use any products that have nicotine or tobacco in them, such as cigarettes and e-cigarettes. If you need help quitting, ask your doctor.  If you have diabetes, work with your doctor to make sure your blood sugar stays in a healthy range.  If your feet feel numb: ? Check for redness, warmth, and swelling every day. ? Wear padded socks and comfortable shoes. These help protect your feet.  Keep all follow-up visits as told by your doctor. This is important. Contact a doctor if:  You have paresthesia that gets worse or does not go away.  Your burning or prickling feeling gets worse when you walk.  You have pain or cramps.  You feel dizzy.  You have a rash. Get help right away if  you:  Feel weak.  Have trouble walking or moving.  Have problems speaking, understanding, or seeing.  Feel confused.  Cannot control when you pee (urinate) or poop (have a bowel movement).  Lose feeling (have numbness) after an injury.  Have new weakness in an arm or leg.  Pass out (faint). Summary  Paresthesia is a burning or prickling feeling. It often happens in the hands, arms, legs, or feet.  In most cases, the feeling goes away in a short time and is not a sign of a serious problem.  If you have paresthesia that lasts a long time, you may need to be seen by your doctor. This information is not intended to replace advice given to you by your health care provider. Make sure you discuss any questions you have with your health care provider. Document Released: 03/20/2008 Document Revised: 05/03/2018 Document Reviewed: 04/16/2017 Elsevier Patient Education  2020 [employer]  

## 2019-01-08 ENCOUNTER — Encounter: Payer: Self-pay | Admitting: Family Medicine

## 2019-01-08 LAB — URIC ACID: Uric Acid: 5.2 mg/dL (ref 3.7–8.6)

## 2019-01-08 LAB — CMP14+EGFR
ALT: 26 IU/L (ref 0–44)
AST: 21 IU/L (ref 0–40)
Albumin/Globulin Ratio: 2.1 (ref 1.2–2.2)
Albumin: 4.5 g/dL (ref 3.8–4.9)
Alkaline Phosphatase: 69 IU/L (ref 39–117)
BUN/Creatinine Ratio: 20 (ref 9–20)
BUN: 17 mg/dL (ref 6–24)
Bilirubin Total: 0.3 mg/dL (ref 0.0–1.2)
CO2: 21 mmol/L (ref 20–29)
Calcium: 9.9 mg/dL (ref 8.7–10.2)
Chloride: 103 mmol/L (ref 96–106)
Creatinine, Ser: 0.83 mg/dL (ref 0.76–1.27)
GFR calc Af Amer: 118 mL/min/{1.73_m2} (ref >59)
GFR calc non Af Amer: 102 mL/min/{1.73_m2} (ref >59)
Globulin, Total: 2.1 g/dL (ref 1.5–4.5)
Glucose: 89 mg/dL (ref 65–99)
Potassium: 4.4 mmol/L (ref 3.5–5.2)
Sodium: 139 mmol/L (ref 134–144)
Total Protein: 6.6 g/dL (ref 6.0–8.5)

## 2019-01-08 LAB — CBC WITH DIFFERENTIAL/PLATELET
Basophils Absolute: 0.1 10*3/uL (ref 0.0–0.2)
Basos: 1 %
EOS (ABSOLUTE): 0.3 10*3/uL (ref 0.0–0.4)
Eos: 5 %
Hematocrit: 50.2 % (ref 37.5–51.0)
Hemoglobin: 17.1 g/dL (ref 13.0–17.7)
Immature Grans (Abs): 0 10*3/uL (ref 0.0–0.1)
Immature Granulocytes: 0 %
Lymphocytes Absolute: 1.5 10*3/uL (ref 0.7–3.1)
Lymphs: 26 %
MCH: 29.3 pg (ref 26.6–33.0)
MCHC: 34.1 g/dL (ref 31.5–35.7)
MCV: 86 fL (ref 79–97)
Monocytes Absolute: 0.5 10*3/uL (ref 0.1–0.9)
Monocytes: 8 %
Neutrophils Absolute: 3.4 10*3/uL (ref 1.4–7.0)
Neutrophils: 60 %
Platelets: 285 10*3/uL (ref 150–450)
RBC: 5.83 x10E6/uL — ABNORMAL HIGH (ref 4.14–5.80)
RDW: 12.9 % (ref 11.6–15.4)
WBC: 5.8 10*3/uL (ref 3.4–10.8)

## 2019-01-08 LAB — VITAMIN B12: Vitamin B-12: 354 pg/mL (ref 232–1245)

## 2019-01-08 LAB — THYROID PANEL WITH TSH
Free Thyroxine Index: 1.5 (ref 1.2–4.9)
T3 Uptake Ratio: 25 % (ref 24–39)
T4, Total: 5.9 ug/dL (ref 4.5–12.0)
TSH: 1.35 u[IU]/mL (ref 0.450–4.500)

## 2019-01-19 NOTE — Progress Notes (Signed)
Subjective:   Patient ID: Scott Rasmussen, male   DOB: 53 y.o.   MRN: [id number]   HPI 53 year old male presents the office for concerns of pain to the bottom aspects of both of his heels which is been on the last 3 to 4 months.  Gets pain when he first gets up he has been taking Aleve.  Describes an aching sensation.  No recent injury.  He also states that he has a carbon fiber insert in his right foot because of hallux limitus.   Review of Systems  All other systems reviewed and are negative.  Past Medical History:  Diagnosis Date  . Gout   . Hallux rigidus 10/11/2018   right   . Hyperlipidemia   . Shortness of breath   . Sleep apnea    had sx   . Umbilical hernia     Past Surgical History:  Procedure Laterality Date  . TONSILLECTOMY AND ADENOIDECTOMY  2008  . UVULOPALATOPHARYNGOPLASTY  2008     Current Outpatient Medications:  .  febuxostat (ULORIC) 40 MG tablet, Take 1 tablet (40 mg total) by mouth daily., Disp: 90 tablet, Rfl: 3 .  Fluocinonide Emulsified Base 0.05 % CREA, APPLY ONE APPLICATION TOPICALLY TWICE DAILY, Disp: 30 g, Rfl: 2 .  fluticasone (FLONASE) 50 MCG/ACT nasal spray, Place 2 sprays into both nostrils daily., Disp: 48 g, Rfl: 3 .  hydrocortisone (ANUSOL-HC) 2.5 % rectal cream, Place 1 application rectally 2 (two) times daily. (Patient not taking: Reported on 01/07/2019), Disp: 30 g, Rfl: 1 .  Icosapent Ethyl 1 g CAPS, Take 2 capsules (2 g total) by mouth 2 (two) times daily., Disp: 360 capsule, Rfl: 3 .  loratadine (CLARITIN) 10 MG tablet, Take 1 tablet (10 mg total) by mouth daily., Disp: 90 tablet, Rfl: 3 .  meloxicam (MOBIC) 15 MG tablet, Take 1 tablet (15 mg total) by mouth daily., Disp: 30 tablet, Rfl: 0 .  omeprazole (PRILOSEC) 40 MG capsule, Take 1 capsule (40 mg total) by mouth daily., Disp: 90 capsule, Rfl: 3 .  Vitamin D, Ergocalciferol, (DRISDOL) 1.25 MG (50000 UT) CAPS capsule, Take 1 capsule (50,000 Units total) by mouth every 7 (seven) days.,  Disp: 12 capsule, Rfl: 3  Allergies  Allergen Reactions  . Depo-Medrol [Methylprednisolone Acetate] Rash        Objective:  Physical Exam  General: AAO x3, NAD  Dermatological: Skin is warm, dry and supple bilateral. Nails x 10 are well manicured; remaining integument appears unremarkable at this time. There are no open sores, no preulcerative lesions, no rash or signs of infection present.  Vascular: Dorsalis Pedis artery and Posterior Tibial artery pedal pulses are 2/4 bilateral with immedate capillary fill time. Pedal hair growth present. No varicosities and no lower extremity edema present bilateral. There is no pain with calf compression, swelling, warmth, erythema.   Neruologic: Grossly intact via light touch bilateral.  Protective threshold with Semmes Wienstein monofilament intact to all pedal sites bilateral.  Negative Tinel sign.  Musculoskeletal: Decreased range of motion of first MPJ. Tenderness to palpation along the plantar medial tubercle of the calcaneus at the insertion of plantar fascia on the left and right foot. There is no pain along the course of the plantar fascia within the arch of the foot. Plantar fascia appears to be intact. There is no pain with lateral compression of the calcaneus or pain with vibratory sensation. There is no pain along the course or insertion of the achilles tendon. No other areas  of tenderness to bilateral lower extremities. Muscular strength 5/5 in all groups tested bilateral.  Gait: Unassisted, Nonantalgic.      Assessment:   Bilateral plantar fasciitis     Plan:  -Treatment options discussed including all alternatives, risks, and complications -Etiology of symptoms were discussed -X-rays were obtained and reviewed with the patient. No evidence of acute fracture identified. -Steroid injection performed bilaterally.  See procedure note below. -Plantar fascial brace bilaterally Prescribed mobic. Discussed side effects of the  medication and directed to stop if any are to occur and call the office.  -Stretching, icing daily.  Discussed shoe modifications and orthotics  Procedure: Injection Tendon/Ligament Discussed alternatives, risks, complications and verbal consent was obtained.  Location: Bilateral plantar fascia at the glabrous junction; medial approach. Skin Prep: Alcohol  Injectate: 0.5cc 0.5% marcaine plain, 0.5 cc 2% lidocaine plain and, 1 cc kenalog 10. Disposition: Patient tolerated procedure well. Injection site dressed with a band-aid.  Post-injection care was discussed and return precautions discussed.    Trula Slade DPM

## 2019-01-20 ENCOUNTER — Other Ambulatory Visit: Payer: Self-pay | Admitting: Emergency Medicine

## 2019-01-20 DIAGNOSIS — Z20822 Contact with and (suspected) exposure to covid-19: Secondary | ICD-10-CM

## 2019-01-21 ENCOUNTER — Telehealth: Payer: Self-pay | Admitting: Family

## 2019-01-21 LAB — NOVEL CORONAVIRUS, NAA: SARS-CoV-2, NAA: DETECTED — AB

## 2019-01-21 NOTE — Telephone Encounter (Signed)
Patient aware.

## 2019-01-21 NOTE — Telephone Encounter (Signed)
He can complete this prednisone if this was his foot. If this was given for his cough, he does not to take.

## 2019-01-21 NOTE — Telephone Encounter (Signed)
What symptoms do you have? Positive for COVID and has prednisone and needs to know if he should take it. Has 15 pills left  How long have you been sick? 2 days  Have you been seen for this problem? NO  If your provider decides to give you a prescription, which pharmacy would you like for it to be sent to? [facility]   Patient informed that this information will be sent to the clinical staff for review and that they should receive a follow up call.

## 2019-01-24 ENCOUNTER — Ambulatory Visit: Payer: BC Managed Care – PPO | Admitting: Podiatry

## 2019-01-31 ENCOUNTER — Ambulatory Visit (INDEPENDENT_AMBULATORY_CARE_PROVIDER_SITE_OTHER): Payer: BC Managed Care – PPO | Admitting: Family Medicine

## 2019-01-31 ENCOUNTER — Other Ambulatory Visit: Payer: Self-pay

## 2019-01-31 ENCOUNTER — Emergency Department (HOSPITAL_BASED_OUTPATIENT_CLINIC_OR_DEPARTMENT_OTHER)
Admission: EM | Admit: 2019-01-31 | Discharge: 2019-01-31 | Disposition: A | Discharge status: Home / Self Care | Payer: BC Managed Care – PPO | Attending: Emergency Medicine | Admitting: Emergency Medicine

## 2019-01-31 ENCOUNTER — Encounter: Payer: Self-pay | Admitting: Family Medicine

## 2019-01-31 ENCOUNTER — Encounter (HOSPITAL_BASED_OUTPATIENT_CLINIC_OR_DEPARTMENT_OTHER): Payer: Self-pay

## 2019-01-31 ENCOUNTER — Emergency Department (HOSPITAL_BASED_OUTPATIENT_CLINIC_OR_DEPARTMENT_OTHER): Payer: BC Managed Care – PPO

## 2019-01-31 DIAGNOSIS — R05 Cough: Secondary | ICD-10-CM | POA: Diagnosis not present

## 2019-01-31 DIAGNOSIS — U071 COVID-19: Secondary | ICD-10-CM | POA: Diagnosis not present

## 2019-01-31 DIAGNOSIS — R509 Fever, unspecified: Secondary | ICD-10-CM | POA: Diagnosis not present

## 2019-01-31 DIAGNOSIS — Z79899 Other long term (current) drug therapy: Secondary | ICD-10-CM | POA: Insufficient documentation

## 2019-01-31 DIAGNOSIS — Z87891 Personal history of nicotine dependence: Secondary | ICD-10-CM | POA: Diagnosis not present

## 2019-01-31 DIAGNOSIS — R197 Diarrhea, unspecified: Secondary | ICD-10-CM | POA: Diagnosis not present

## 2019-01-31 NOTE — ED Provider Notes (Signed)
[facility] EMERGENCY DEPARTMENT Provider Note   CSN: [id number] Arrival date & time: 01/31/19  1058     History   Chief Complaint Chief Complaint  Patient presents with  . Cough    +covid    HPI Scott Rasmussen is a 53 y.o. male who presents to the ED after having telemedicine visit with PCP this morning. Pt was tested for covid 19 on 10/01 and received results 10/02 which were positive. He reports he did not start showing symptoms until 10/05. He currently complains of a fever with tmax 102, headache, cough, and rib pain from coughing. He has been taking Tylenol for his fevers with relief although he reports the fever will return once the tylenol wears off. Pt did have some left over oxycodone that he took for his headache without relief - he reports nothing has touched his headache which mainly prompted his virtual visit with his PCP today. He was advised to come to the ED or urgent care for further evaluation given continued symptoms and coughing; he was advised to get a chest xray to rule out pneumonia. Denies vision changes, neck stiffness, confusion, slurring of speech, unilateral weakness or numbness, rash, shortness of breath, chest pian, or any other associated symptoms.        Past Medical History:  Diagnosis Date  . Gout   . Hallux rigidus 10/11/2018   right   . Hyperlipidemia   . Shortness of breath   . Sleep apnea    had sx   . Umbilical hernia     Patient Active Problem List   Diagnosis Date Noted  . Degeneration of lumbar intervertebral disc 08/24/2018  . Acquired hallux rigidus of right foot 06/10/2018  . Metabolic syndrome 03/09/2018  . Thoracic aortic atherosclerosis (HCC) 12/05/2015  . Depression 06/01/2015  . Allergic rhinitis 08/15/2014  . Abnormal liver function test 11/08/2013  . Obesity, Class II, BMI 35-39.9 01/03/2013  . Gout 11/26/2012  . Hypertriglyceridemia 11/26/2012  . Gastroesophageal reflux disease without esophagitis  11/26/2012    Past Surgical History:  Procedure Laterality Date  . TONSILLECTOMY AND ADENOIDECTOMY  2008  . UVULOPALATOPHARYNGOPLASTY  2008        Home Medications    Prior to Admission medications   Medication Sig Start Date End Date Taking? Authorizing Provider  febuxostat (ULORIC) 40 MG tablet Take 1 tablet (40 mg total) by mouth daily. 06/10/18   Ernestina PennaMoore, Donald W, MD  Fluocinonide Emulsified Base 0.05 % CREA APPLY ONE APPLICATION TOPICALLY TWICE DAILY 07/15/17   Ernestina PennaMoore, Donald W, MD  fluticasone [facility](FLONASE) 50 MCG/ACT nasal spray Place 2 sprays into both nostrils daily. 10/11/18   Ernestina PennaMoore, Donald W, MD  hydrocortisone (ANUSOL-HC) 2.5 % rectal cream Place 1 application rectally 2 (two) times daily. Patient not taking: Reported on 01/07/2019 07/01/17   Ernestina PennaMoore, Donald W, MD  Icosapent Ethyl 1 g CAPS Take 2 capsules (2 g total) by mouth 2 (two) times daily. 06/30/18   Ernestina PennaMoore, Donald W, MD  loratadine (CLARITIN) 10 MG tablet Take 1 tablet (10 mg total) by mouth daily. 09/01/18   Ernestina PennaMoore, Donald W, MD  meloxicam (MOBIC) 15 MG tablet Take 1 tablet (15 mg total) by mouth daily. 01/07/19 01/07/20  Vivi BarrackWagoner, Matthew R, DPM  omeprazole (PRILOSEC) 40 MG capsule Take 1 capsule (40 mg total) by mouth daily. 05/14/18   Ernestina PennaMoore, Donald W, MD  Vitamin D, Ergocalciferol, (DRISDOL) 1.25 MG (50000 UT) CAPS capsule Take 1 capsule (50,000 Units total) by mouth every  7 (seven) days. 06/10/18   Chipper Herb, MD    Family History Family History  Problem Relation Age of Onset  . Heart disease Father        heart attack  . Colon cancer Neg Hx   . Esophageal cancer Neg Hx   . Rectal cancer Neg Hx   . Stomach cancer Neg Hx     Social History Social History   Tobacco Use  . Smoking status: Former Research scientist (life sciences)  . Smokeless tobacco: Never Used  Substance Use Topics  . Alcohol use: Yes    Comment: 2 drinks per month per pt.  . Drug use: No     Allergies   Depo-medrol [methylprednisolone acetate]   Review of Systems  Review of Systems  Constitutional: Positive for fatigue and fever. Negative for chills.  HENT: Negative for congestion and sore throat.   Eyes: Negative for visual disturbance.  Respiratory: Positive for cough. Negative for shortness of breath.   Cardiovascular: Negative for chest pain.  Gastrointestinal: Positive for diarrhea. Negative for abdominal pain, nausea and vomiting.  Genitourinary: Negative for dysuria.  Musculoskeletal: Negative for myalgias, neck pain and neck stiffness.  Skin: Negative for rash.  Neurological: Positive for headaches. Negative for dizziness, syncope, weakness, light-headedness and numbness.     Physical Exam Updated Vital Signs BP 139/90 (BP Location: Right Arm)   Pulse 95   Temp 98.7 F (37.1 C) (Oral)   Resp 20   SpO2 95%   Physical Exam Vitals signs and nursing note reviewed.  Constitutional:      Appearance: He is not ill-appearing or diaphoretic.  HENT:     Head: Normocephalic and atraumatic.     Right Ear: Tympanic membrane normal.     Left Ear: Tympanic membrane normal.  Eyes:     Extraocular Movements: Extraocular movements intact.     Conjunctiva/sclera: Conjunctivae normal.     Pupils: Pupils are equal, round, and reactive to light.  Neck:     Musculoskeletal: Neck supple.  Cardiovascular:     Rate and Rhythm: Normal rate and regular rhythm.     Pulses: Normal pulses.  Pulmonary:     Effort: Pulmonary effort is normal.     Breath sounds: Normal breath sounds. No wheezing, rhonchi or rales.  Abdominal:     Palpations: Abdomen is soft.     Tenderness: There is no abdominal tenderness. There is no guarding or rebound.  Musculoskeletal:     Right lower leg: No edema.     Left lower leg: No edema.  Skin:    General: Skin is warm and dry.  Neurological:     Mental Status: He is alert.     Comments: CN 3-12 grossly intact A&O x4 GCS 15 Sensation and strength intact Gait nonataxic including with tandem walking Coordination  with finger-to-nose WNL Neg romberg, neg pronator drift      ED Treatments / Results  Labs (all labs ordered are listed, but only abnormal results are displayed) Labs Reviewed - No data to display  EKG None  Radiology Dg Chest [facility] 1 View  Result Date: 01/31/2019 CLINICAL DATA:  Fever, cough. EXAM: PORTABLE CHEST 1 VIEW COMPARISON:  October 15, 2016. FINDINGS: Stable cardiomediastinal silhouette. No pneumothorax or pleural effusion is noted. Right lung is clear. Minimal left lingular subsegmental atelectasis or scarring is noted. Bony thorax is unremarkable. IMPRESSION: Minimal left lingular subsegmental atelectasis or scarring. Electronically Signed   By: Marijo Conception M.D.   On: 01/31/2019  12:11    Procedures Procedures (including critical care time)  Medications Ordered in ED Medications - No data to display   Initial Impression / Assessment and Plan / ED Course  I have reviewed the triage vital signs and the nursing notes.  Pertinent labs & imaging results that were available during my care of the patient were reviewed by me and considered in my medical decision making (see chart for details).    53 year old male who is COVID positive presents to the ED today with continued symptoms including cough, fever, headache x8 days.  Tested positive on 10/02 -states he was around friends who tested positive.  Had a telemedicine visit with his PCP today after continued symptoms and was advised to come to the ED for further evaluation.  Recommended he get a chest x-ray to rule out infection.  All signs are stable in the ED.  Patient is afebrile.  No tachycardia or tachypnea.  He is satting 95% on room air.  Will ambulate patient to ensure pulse ox does not drop and obtain chest x-ray at this time.  Suspect dispo home   Chest xray with left lingular atelectasis; no other findings today. Pt was ambulated in his room; per nursing staff his oxygen saturation did dropped down to 88% with  ambulation but he was able to recover quickly at rest. He was able to speak in full sentences the entire time despite lower O2 saturation. Feel he is safe for discharge home at this time. Pt advised to continue symptomatic treatment at home for his symptoms. He is advised to continue self isolating for 2 weeks from symptom onset and to follow up with his PCP. Pt is in agreement with plan at this time and stable for discharge home.   This note was prepared using Dragon voice recognition software and may include unintentional dictation errors due to the inherent limitations of voice recognition software.  Scott Rasmussen was evaluated in Emergency Department on 01/31/2019 for the symptoms described in the history of present illness. He was evaluated in the context of the global COVID-19 pandemic, which necessitated consideration that the patient might be at risk for infection with the SARS-CoV-2 virus that causes COVID-19. Institutional protocols and algorithms that pertain to the evaluation of patients at risk for COVID-19 are in a state of rapid change based on information released by regulatory bodies including the CDC and federal and state organizations. These policies and algorithms were followed during the patient's care in the ED.        Final Clinical Impressions(s) / ED Diagnoses   Final diagnoses:  COVID-19    ED Discharge Orders    None       Tanda Rockers, PA-C 01/31/19 1224    Gwyneth Sprout, MD 01/31/19 2149

## 2019-01-31 NOTE — Discharge Instructions (Signed)
Your chest xray did not show any signs of pneumonia  Please continue symptomatic treatment at home for your symptoms. You can take Excedrin migraine for your headache.  Continue to self isolate for 2 weeks from when you began showing symptoms.  Follow up with your PCP.

## 2019-01-31 NOTE — ED Triage Notes (Signed)
Pt c/o flu like sx-states +covid x 8 days-NAD-steady gait

## 2019-01-31 NOTE — Progress Notes (Signed)
Virtual Visit via telephone Note Due to COVID-19 pandemic this visit was conducted virtually. This visit type was conducted due to national recommendations for restrictions regarding the COVID-19 Pandemic (e.g. social distancing, sheltering in place) in an effort to limit this patient's exposure and mitigate transmission in our community. All issues noted in this document were discussed and addressed.  A physical exam was not performed with this format.   I connected with [name] on 01/31/19 at 0830 by telephone and verified that I am speaking with the correct person using two identifiers. Scott Rasmussen is currently located at home and family is currently with them during visit. The provider, Monia Pouch, FNP is located in their office at time of visit.  I discussed the limitations, risks, security and privacy concerns of performing an evaluation and management service by telephone and the availability of in person appointments. I also discussed with the patient that there may be a patient responsible charge related to this service. The patient expressed understanding and agreed to proceed.  Subjective:  Patient ID: Scott Rasmussen, male    DOB: 05/28/1965, 53 y.o.   MRN: [id number]  Chief Complaint:  Fever and COVID-19   HPI: Scott Rasmussen is a 53 y.o. male presenting on 01/31/2019 for Fever and COVID-19   Pt tested positive for COVID-19 on 01/20/2019. Pt reports ongoing fever, headache, shortness of breath, cough, chills, congestion, and fatigue. Pt states he has been taking tylenol, mucinex, and oxycodone for the symptoms without relief. States his fever continues, will subside for a few hours post Tylenol but then returns. States his headache is worsening. Reports it is a burning and sharp sensation to his right temple, 8/10 pain at times that is not relived by oxycodone. No focal deficits or dizziness with the headaches. No confusion. He does report cough and congestion with  minimal shortness of breath. States he feels worn out and tired. Reports worsening symptoms over the last 8 days.     Relevant past medical, surgical, family, and social history reviewed and updated as indicated.  Allergies and medications reviewed and updated.   Past Medical History:  Diagnosis Date  . Gout   . Hallux rigidus 10/11/2018   right   . Hyperlipidemia   . Shortness of breath   . Sleep apnea    had sx   . Umbilical hernia     Past Surgical History:  Procedure Laterality Date  . TONSILLECTOMY AND ADENOIDECTOMY  2008  . UVULOPALATOPHARYNGOPLASTY  2008    Social History   Socioeconomic History  . Marital status: Married    Spouse name: Not on file  . Number of children: Not on file  . Years of education: Not on file  . Highest education level: Not on file  Occupational History  . Not on file  Social Needs  . Financial resource strain: Not on file  . Food insecurity    Worry: Not on file    Inability: Not on file  . Transportation needs    Medical: Not on file    Non-medical: Not on file  Tobacco Use  . Smoking status: Former Research scientist (life sciences)  . Smokeless tobacco: Never Used  Substance and Sexual Activity  . Alcohol use: Yes    Comment: 2 drinks per month per pt.  . Drug use: No  . Sexual activity: Not on file  Lifestyle  . Physical activity    Days per week: Not on file    Minutes per  session: Not on file  . Stress: Not on file  Relationships  . Social Musician on phone: Not on file    Gets together: Not on file    Attends religious service: Not on file    Active member of club or organization: Not on file    Attends meetings of clubs or organizations: Not on file    Relationship status: Not on file  . Intimate partner violence    Fear of current or ex partner: Not on file    Emotionally abused: Not on file    Physically abused: Not on file    Forced sexual activity: Not on file  Other Topics Concern  . Not on file  Social History  Narrative  . Not on file    Outpatient Encounter Medications as of 01/31/2019  Medication Sig  . febuxostat (ULORIC) 40 MG tablet Take 1 tablet (40 mg total) by mouth daily.  . Fluocinonide Emulsified Base 0.05 % CREA APPLY ONE APPLICATION TOPICALLY TWICE DAILY  . fluticasone (FLONASE) 50 MCG/ACT nasal spray Place 2 sprays into both nostrils daily.  . hydrocortisone (ANUSOL-HC) 2.5 % rectal cream Place 1 application rectally 2 (two) times daily. (Patient not taking: Reported on 01/07/2019)  . Icosapent Ethyl 1 g CAPS Take 2 capsules (2 g total) by mouth 2 (two) times daily.  Marland Kitchen loratadine (CLARITIN) 10 MG tablet Take 1 tablet (10 mg total) by mouth daily.  . meloxicam (MOBIC) 15 MG tablet Take 1 tablet (15 mg total) by mouth daily.  Marland Kitchen omeprazole (PRILOSEC) 40 MG capsule Take 1 capsule (40 mg total) by mouth daily.  . Vitamin D, Ergocalciferol, (DRISDOL) 1.25 MG (50000 UT) CAPS capsule Take 1 capsule (50,000 Units total) by mouth every 7 (seven) days.   No facility-administered encounter medications on file as of 01/31/2019.     Allergies  Allergen Reactions  . Depo-Medrol [Methylprednisolone Acetate] Rash    Review of Systems  Constitutional: Positive for activity change, appetite change, chills, fatigue and fever. Negative for diaphoresis and unexpected weight change.  HENT: Positive for congestion, postnasal drip, rhinorrhea and sore throat.   Eyes: Negative.  Negative for photophobia and visual disturbance.  Respiratory: Positive for cough and shortness of breath. Negative for chest tightness and wheezing.   Cardiovascular: Negative for chest pain, palpitations and leg swelling.  Gastrointestinal: Positive for abdominal pain. Negative for abdominal distention, anal bleeding, blood in stool, constipation, diarrhea, nausea, rectal pain and vomiting.  Endocrine: Negative.   Genitourinary: Negative for decreased urine volume, difficulty urinating, dysuria, frequency and urgency.   Musculoskeletal: Positive for arthralgias and myalgias.  Skin: Negative.   Allergic/Immunologic: Negative.   Neurological: Positive for headaches. Negative for dizziness, tremors, seizures, syncope, facial asymmetry, speech difficulty, weakness, light-headedness and numbness.  Hematological: Negative.   Psychiatric/Behavioral: Negative for confusion, hallucinations, sleep disturbance and suicidal ideas.  All other systems reviewed and are negative.        Observations/Objective: No vital signs or physical exam, this was a telephone or virtual health encounter.  Pt alert and oriented, answers all questions appropriately, and able to speak in full sentences.    Assessment and Plan: Javad was seen today for fever and covid-19.  Diagnoses and all orders for this visit:  COVID-19 virus infection Fever and chills Pt with known COVID-19 infection with persistent and worsening symptoms despite symptomatic care at home for 8+ days. It was recommended for the pt to be evaluated at Urgent Care or the Emergency  Department due to worsening symptoms. Pt and family verbalized understanding.     Follow Up Instructions: Return if symptoms worsen or fail to improve.    I discussed the assessment and treatment plan with the patient. The patient was provided an opportunity to ask questions and all were answered. The patient agreed with the plan and demonstrated an understanding of the instructions.   The patient was advised to call back or seek an in-person evaluation if the symptoms worsen or if the condition fails to improve as anticipated.  The above assessment and management plan was discussed with the patient. The patient verbalized understanding of and has agreed to the management plan. Patient is aware to call the clinic if they develop any new symptoms or if symptoms persist or worsen. Patient is aware when to return to the clinic for a follow-up visit. Patient educated on when it is  appropriate to go to the emergency department.    I provided 15 minutes of non-face-to-face time during this encounter. The call started at 0830. The call ended at 0845. The other time was used for coordination of care.    Kari BaarsMichelle Rakes, FNP-C [facility] [address] [place], [place] [address] [phone] 01/31/19

## 2019-01-31 NOTE — ED Notes (Signed)
Pulse ox measured while ambulating in room.  Pt sats 88% with exertion, but no acute distress.  Able to speak in complete sentences.  Mild intermittent non productive cough.  Pt afebrile since yesterday without medications.

## 2019-02-02 ENCOUNTER — Ambulatory Visit (INDEPENDENT_AMBULATORY_CARE_PROVIDER_SITE_OTHER): Payer: BC Managed Care – PPO | Admitting: Physician Assistant

## 2019-02-02 ENCOUNTER — Encounter: Payer: Self-pay | Admitting: Physician Assistant

## 2019-02-02 DIAGNOSIS — R059 Cough, unspecified: Secondary | ICD-10-CM

## 2019-02-02 DIAGNOSIS — R05 Cough: Secondary | ICD-10-CM | POA: Diagnosis not present

## 2019-02-02 DIAGNOSIS — U071 COVID-19: Secondary | ICD-10-CM | POA: Insufficient documentation

## 2019-02-02 MED ORDER — ALBUTEROL SULFATE HFA 108 (90 BASE) MCG/ACT IN AERS: 2.0000 | INHALATION_SPRAY | Freq: Four times a day (QID) | RESPIRATORY_TRACT | 0 refills | Status: DC | PRN

## 2019-02-02 MED ORDER — HYDROCODONE-HOMATROPINE 5-1.5 MG/5ML PO SYRP: 5.0000 mL | ORAL_SOLUTION | Freq: Four times a day (QID) | ORAL | 0 refills | Status: DC | PRN

## 2019-02-02 NOTE — Progress Notes (Signed)
Telephone visit  Subjective: JE:HUDJS, dyspnea PCP: Junie Spencer, FNP HFW:YOVZCHY Scott Rasmussen is a 53 y.o. male calls for telephone consult today. Patient provides verbal consent for consult held via phone.  Patient is identified with 2 separate identifiers.  At this time the entire area is on COVID-19 social distancing and stay home orders are in place.  Patient is of higher risk and therefore we are performing this by a virtual method.  Location of patient: home Location of provider: HOME Others present for call: no  Patient is having a recheck on his COVID infection.  He was tested positive and symptoms started 4 days later.  And he has had 8 days of fever stopped 01/31/19 Headache last week, continue now. also cough and congestion continue but also he is experiencing some shortness of breath after he walks for a little bit.  His note from the emergency department is reviewed [facility] CXR normal Walking around and dropped to 87 while walking and then it recovered.  If severe problems breathing, go to the ED.   When exerted gets winded and tired.    Albuterol    ROS: Per HPI  Allergies  Allergen Reactions  . Depo-Medrol [Methylprednisolone Acetate] Rash   Past Medical History:  Diagnosis Date  . Gout   . Hallux rigidus 10/11/2018   right   . Hyperlipidemia   . Shortness of breath   . Sleep apnea    had sx   . Umbilical hernia     Current Outpatient Medications:  .  albuterol (VENTOLIN HFA) 108 (90 Base) MCG/ACT inhaler, Inhale 2 puffs into the lungs every 6 (six) hours as needed for wheezing or shortness of breath., Disp: 18 g, Rfl: 0 .  febuxostat (ULORIC) 40 MG tablet, Take 1 tablet (40 mg total) by mouth daily., Disp: 90 tablet, Rfl: 3 .  Fluocinonide Emulsified Base 0.05 % CREA, APPLY ONE APPLICATION TOPICALLY TWICE DAILY, Disp: 30 g, Rfl: 2 .  fluticasone (FLONASE) 50 MCG/ACT nasal spray, Place 2 sprays into both nostrils daily., Disp: 48 g, Rfl:  3 .  HYDROcodone-homatropine (HYCODAN) 5-1.5 MG/5ML syrup, Take 5 mLs by mouth every 6 (six) hours as needed for cough., Disp: 120 mL, Rfl: 0 .  hydrocortisone (ANUSOL-HC) 2.5 % rectal cream, Place 1 application rectally 2 (two) times daily., Disp: 30 g, Rfl: 1 .  Icosapent Ethyl 1 g CAPS, Take 2 capsules (2 g total) by mouth 2 (two) times daily., Disp: 360 capsule, Rfl: 3 .  loratadine (CLARITIN) 10 MG tablet, Take 1 tablet (10 mg total) by mouth daily., Disp: 90 tablet, Rfl: 3 .  meloxicam (MOBIC) 15 MG tablet, Take 1 tablet (15 mg total) by mouth daily., Disp: 30 tablet, Rfl: 0 .  omeprazole (PRILOSEC) 40 MG capsule, Take 1 capsule (40 mg total) by mouth daily., Disp: 90 capsule, Rfl: 3 .  Vitamin D, Ergocalciferol, (DRISDOL) 1.25 MG (50000 UT) CAPS capsule, Take 1 capsule (50,000 Units total) by mouth every 7 (seven) days., Disp: 12 capsule, Rfl: 3  Assessment/ Plan: 53 y.o. male   1. COVID-19 virus infection - albuterol (VENTOLIN HFA) 108 (90 Base) MCG/ACT inhaler; Inhale 2 puffs into the lungs every 6 (six) hours as needed for wheezing or shortness of breath.  Dispense: 18 g; Refill: 0  2. Cough - HYDROcodone-homatropine (HYCODAN) 5-1.5 MG/5ML syrup; Take 5 mLs by mouth every 6 (six) hours as needed for cough.  Dispense: 120 mL; Refill: 0 - albuterol (  VENTOLIN HFA) 108 (90 Base) MCG/ACT inhaler; Inhale 2 puffs into the lungs every 6 (six) hours as needed for wheezing or shortness of breath.  Dispense: 18 g; Refill: 0   No follow-ups on file.  Continue all other maintenance medications as listed above.  Start time: 2:02 PM End time: 2:14 PM  Meds ordered this encounter  Medications  . DISCONTD: albuterol (VENTOLIN HFA) 108 (90 Base) MCG/ACT inhaler    Sig: Inhale 2 puffs into the lungs every 6 (six) hours as needed for wheezing or shortness of breath.    Dispense:  18 g    Refill:  0    Order Specific Question:   Supervising Provider    Answer:   Janora Norlander [[id number]]   . DISCONTD: HYDROcodone-homatropine (HYCODAN) 5-1.5 MG/5ML syrup    Sig: Take 5 mLs by mouth every 6 (six) hours as needed for cough.    Dispense:  120 mL    Refill:  0    Order Specific Question:   Supervising Provider    Answer:   Janora Norlander [[id number]]  . HYDROcodone-homatropine (HYCODAN) 5-1.5 MG/5ML syrup    Sig: Take 5 mLs by mouth every 6 (six) hours as needed for cough.    Dispense:  120 mL    Refill:  0    Order Specific Question:   Supervising Provider    Answer:   Janora Norlander [[id number]]  . albuterol (VENTOLIN HFA) 108 (90 Base) MCG/ACT inhaler    Sig: Inhale 2 puffs into the lungs every 6 (six) hours as needed for wheezing or shortness of breath.    Dispense:  18 g    Refill:  0    Order Specific Question:   Supervising Provider    Answer:   Janora Norlander [[id number]]    Particia Nearing PA-C [place] [phone]

## 2019-02-07 ENCOUNTER — Other Ambulatory Visit (HOSPITAL_COMMUNITY): Payer: Self-pay | Admitting: Orthopedic Surgery

## 2019-02-08 ENCOUNTER — Telehealth: Payer: Self-pay | Admitting: Family

## 2019-02-08 NOTE — Telephone Encounter (Signed)
Patient is still very fatigued from [place] anytime he does anything with exersetion he is compltley out of breathing. He wants to know if there is something else he can do. He has an inhaler Jones sent in that he has been using 3 times a day. Patient is having trouble sleeping. Patient states his cough is better and that the cough med really help he only takes it once a day. Please advise.

## 2019-02-08 NOTE — Telephone Encounter (Signed)
appt made

## 2019-02-08 NOTE — Telephone Encounter (Signed)
Please schedule patient appointment

## 2019-02-08 NOTE — Telephone Encounter (Signed)
Schedule as telephone. He may need a chest x-ray to rule out pneumonia.

## 2019-02-08 NOTE — Telephone Encounter (Signed)
Per office policy - pt must be symptom free for 21 days before they can come in. What are your thought??

## 2019-02-09 ENCOUNTER — Ambulatory Visit (INDEPENDENT_AMBULATORY_CARE_PROVIDER_SITE_OTHER): Payer: BC Managed Care – PPO | Admitting: Family

## 2019-02-09 ENCOUNTER — Encounter: Payer: Self-pay | Admitting: Family

## 2019-02-09 DIAGNOSIS — U071 COVID-19: Secondary | ICD-10-CM

## 2019-02-09 DIAGNOSIS — R0602 Shortness of breath: Secondary | ICD-10-CM | POA: Diagnosis not present

## 2019-02-09 MED ORDER — PREDNISONE 10 MG (21) PO TBPK: ORAL_TABLET | ORAL | 0 refills | Status: DC

## 2019-02-09 NOTE — Progress Notes (Signed)
   Virtual Visit via telephone Note Due to COVID-19 pandemic this visit was conducted virtually. This visit type was conducted due to national recommendations for restrictions regarding the COVID-19 Pandemic (e.g. social distancing, sheltering in place) in an effort to limit this patient's exposure and mitigate transmission in our community. All issues noted in this document were discussed and addressed.  A physical exam was not performed with this format.  I connected with Scott Rasmussen on 02/09/19 at 10:08 AM by telephone and verified that I am speaking with the correct person using two identifiers. Scott Rasmussen is currently located at home and no one is currently with him during visit. The provider, Evelina Dun, FNP is located in their office at time of visit.  I discussed the limitations, risks, security and privacy concerns of performing an evaluation and management service by telephone and the availability of in person appointments. I also discussed with the patient that there may be a patient responsible charge related to this service. The patient expressed understanding and agreed to proceed.   History and Present Illness:  Shortness of Breath This is a recurrent problem. The problem occurs every few minutes.    Pt calls the office today with recurrent SOB after testing positive for COVID on 01/20/19. He states he had fevers, cough, and headaches that have improved. However, he states since 01/31/19 he started having SOB. He went to the ED and had a negative chest x-ray at that time. Denies any fevers over the last week. He has been using the albuterol 4-5 times a day with mild relief. He states his SOB is fine if he is sitting, but if he walks any distance he becomes SOB. Reports intermittent productive cough with clear mucus. He is taking mucinex  daily.   Review of Systems  Respiratory: Positive for shortness of breath.   All other systems reviewed and are negative.     Observations/Objective: No SOB or distress noted while talking. Slight hoarse voice noted.   Assessment and Plan: 1. COVID-19 virus infection - predniSONE (STERAPRED UNI-PAK 21 TAB) 10 MG (21) TBPK tablet; Use as directed  Dispense: 21 tablet; Refill: 0  2. SOB (shortness of breath) - predniSONE (STERAPRED UNI-PAK 21 TAB) 10 MG (21) TBPK tablet; Use as directed  Dispense: 21 tablet; Refill: 0  Force fluids Rest Continue albuterol as needed Will give prednisone today and if no improve or worsening by Friday will give antibiotics Red flags discussed to go to ED    I discussed the assessment and treatment plan with the patient. The patient was provided an opportunity to ask questions and all were answered. The patient agreed with the plan and demonstrated an understanding of the instructions.   The patient was advised to call back or seek an in-person evaluation if the symptoms worsen or if the condition fails to improve as anticipated.  The above assessment and management plan was discussed with the patient. The patient verbalized understanding of and has agreed to the management plan. Patient is aware to call the clinic if symptoms persist or worsen. Patient is aware when to return to the clinic for a follow-up visit. Patient educated on when it is appropriate to go to the emergency department.   Time call ended:  10:28 AM  I provided 20 minutes of non-face-to-face time during this encounter.    Evelina Dun, FNP

## 2019-02-11 ENCOUNTER — Telehealth: Payer: Self-pay | Admitting: Family

## 2019-02-11 MED ORDER — DOXYCYCLINE HYCLATE 100 MG PO TABS: 100.0000 mg | ORAL_TABLET | Freq: Two times a day (BID) | ORAL | 0 refills | Status: DC

## 2019-02-11 NOTE — Telephone Encounter (Signed)
Pt called and aware of DOXY - he does not like DOXY - but will pick it up and start it.

## 2019-02-11 NOTE — Telephone Encounter (Signed)
Doxycycline Prescription sent to pharmacy   

## 2019-02-11 NOTE — Telephone Encounter (Signed)
Scott Rasmussen is covering

## 2019-02-11 NOTE — Telephone Encounter (Signed)
Covering PCP- please advise  

## 2019-02-12 DIAGNOSIS — Z87891 Personal history of nicotine dependence: Secondary | ICD-10-CM | POA: Diagnosis not present

## 2019-02-12 DIAGNOSIS — M109 Gout, unspecified: Secondary | ICD-10-CM | POA: Diagnosis not present

## 2019-02-12 DIAGNOSIS — Z791 Long term (current) use of non-steroidal anti-inflammatories (NSAID): Secondary | ICD-10-CM | POA: Diagnosis not present

## 2019-02-12 DIAGNOSIS — R8290 Unspecified abnormal findings in urine: Secondary | ICD-10-CM | POA: Diagnosis not present

## 2019-02-12 DIAGNOSIS — Z79899 Other long term (current) drug therapy: Secondary | ICD-10-CM | POA: Diagnosis not present

## 2019-02-12 DIAGNOSIS — K219 Gastro-esophageal reflux disease without esophagitis: Secondary | ICD-10-CM | POA: Diagnosis not present

## 2019-02-12 DIAGNOSIS — R0602 Shortness of breath: Secondary | ICD-10-CM | POA: Diagnosis not present

## 2019-02-12 DIAGNOSIS — R0902 Hypoxemia: Secondary | ICD-10-CM | POA: Diagnosis not present

## 2019-02-12 DIAGNOSIS — U071 COVID-19: Secondary | ICD-10-CM | POA: Diagnosis not present

## 2019-02-12 DIAGNOSIS — K769 Liver disease, unspecified: Secondary | ICD-10-CM | POA: Diagnosis not present

## 2019-02-12 DIAGNOSIS — R0789 Other chest pain: Secondary | ICD-10-CM | POA: Diagnosis not present

## 2019-02-12 DIAGNOSIS — E785 Hyperlipidemia, unspecified: Secondary | ICD-10-CM | POA: Diagnosis not present

## 2019-02-12 DIAGNOSIS — J9601 Acute respiratory failure with hypoxia: Secondary | ICD-10-CM | POA: Diagnosis not present

## 2019-02-13 DIAGNOSIS — K219 Gastro-esophageal reflux disease without esophagitis: Secondary | ICD-10-CM | POA: Diagnosis not present

## 2019-02-13 DIAGNOSIS — R0902 Hypoxemia: Secondary | ICD-10-CM | POA: Diagnosis not present

## 2019-02-13 DIAGNOSIS — R0602 Shortness of breath: Secondary | ICD-10-CM | POA: Diagnosis not present

## 2019-02-13 DIAGNOSIS — M109 Gout, unspecified: Secondary | ICD-10-CM | POA: Diagnosis not present

## 2019-02-13 DIAGNOSIS — U071 COVID-19: Secondary | ICD-10-CM | POA: Diagnosis not present

## 2019-02-13 DIAGNOSIS — K7689 Other specified diseases of liver: Secondary | ICD-10-CM | POA: Diagnosis not present

## 2019-02-13 DIAGNOSIS — K769 Liver disease, unspecified: Secondary | ICD-10-CM | POA: Diagnosis not present

## 2019-02-13 DIAGNOSIS — J9601 Acute respiratory failure with hypoxia: Secondary | ICD-10-CM | POA: Diagnosis not present

## 2019-02-14 DIAGNOSIS — J9601 Acute respiratory failure with hypoxia: Secondary | ICD-10-CM | POA: Diagnosis not present

## 2019-02-14 MED ORDER — DURACLON EP
1000.00 | EPIDURAL | Status: DC
Start: 2019-02-15 — End: 2019-02-14

## 2019-02-14 MED ORDER — BARO-CAT PO
125.00 | ORAL | Status: DC
Start: ? — End: 2019-02-14

## 2019-02-14 MED ORDER — ROC RETINOL CORREXION EYE EX CREA
100.00 | TOPICAL_CREAM | CUTANEOUS | Status: DC
Start: 2019-02-14 — End: 2019-02-14

## 2019-02-14 MED ORDER — ARMOUR THYROID 30 MG PO TABS
2.00 | ORAL_TABLET | ORAL | Status: DC
Start: ? — End: 2019-02-14

## 2019-02-14 MED ORDER — BURN RELIEF ALOE EX
200.00 | CUTANEOUS | Status: DC
Start: ? — End: 2019-02-14

## 2019-02-14 MED ORDER — MYLANTA ULTRA 700-300 MG PO CHEW
3.00 | CHEWABLE_TABLET | ORAL | Status: DC
Start: 2019-02-14 — End: 2019-02-14

## 2019-02-14 MED ORDER — CVS EAR DROPS OT
40.00 | OTIC | Status: DC
Start: 2019-02-15 — End: 2019-02-14

## 2019-02-14 MED ORDER — AMEDA PURELY YOURS BREAST PUMP MISC
220.00 | Status: DC
Start: 2019-02-15 — End: 2019-02-14

## 2019-02-14 MED ORDER — GENERIC EXTERNAL MEDICATION
500.00 | Status: DC
Start: 2019-02-15 — End: 2019-02-14

## 2019-02-14 MED ORDER — SG DIBROMM 2-12.5 MG/5ML OR ELIX
6.00 | ORAL_SOLUTION | ORAL | Status: DC
Start: 2019-02-15 — End: 2019-02-14

## 2019-02-14 MED ORDER — QUINERVA 260 MG PO TABS
650.00 | ORAL_TABLET | ORAL | Status: DC
Start: ? — End: 2019-02-14

## 2019-02-15 ENCOUNTER — Telehealth: Payer: Self-pay | Admitting: Family Medicine

## 2019-02-15 ENCOUNTER — Other Ambulatory Visit: Payer: Self-pay | Admitting: Physician Assistant

## 2019-02-15 DIAGNOSIS — R059 Cough, unspecified: Secondary | ICD-10-CM

## 2019-02-15 DIAGNOSIS — R0602 Shortness of breath: Secondary | ICD-10-CM

## 2019-02-15 DIAGNOSIS — R05 Cough: Secondary | ICD-10-CM

## 2019-02-15 DIAGNOSIS — U071 COVID-19: Secondary | ICD-10-CM

## 2019-02-15 DIAGNOSIS — R079 Chest pain, unspecified: Secondary | ICD-10-CM

## 2019-02-15 MED ORDER — HYDROCODONE-HOMATROPINE 5-1.5 MG/5ML PO SYRP: 5.0000 mL | ORAL_SOLUTION | Freq: Four times a day (QID) | ORAL | 0 refills | Status: DC | PRN

## 2019-02-15 NOTE — Telephone Encounter (Signed)
Patient aware and verbalized understanding. °

## 2019-02-15 NOTE — Telephone Encounter (Signed)
Patient states he is about the same as he was this weekend with the SOB and cough. He is using the inhaler has one prednisone left he is on doxy still. No childhood lung issues. Pulmonologist is okay. But he states that his lung are fine he seen something on TV he think he needs his heart check to make sure that the COVID has not messed with his heart. Please advise He thinks it might need to be checked at sometime.

## 2019-02-15 NOTE — Telephone Encounter (Signed)
Those are some things that are happening. Therefore I am going to go ahead and set up lung and heart, just in case it is needed.  I will extend the prednisone for a bit longer, continue the inhaler. I will send in cough med. Keep a good diary of all the symptoms you are having and what makes  them worse or better.

## 2019-02-15 NOTE — Telephone Encounter (Signed)
Patient states that this is ridiculous because he has had COVID for 3 weeks and he needs a refill of the hycodan. Pt was advised that his pcp had not seen him or Rx this cough med. Patient was getting very frustrated and asked if I could send this to [facility] how Rx'd it the first time. Told him I would check and call him back would you be willing to refill this on the patient.

## 2019-02-15 NOTE — Telephone Encounter (Signed)
I have never seen this patient nor prescribed this controlled substnace.  He needs an OV for refills.

## 2019-02-15 NOTE — Telephone Encounter (Signed)
I want to get everything straight, cough is the only symptoms now?  Or are there other lung issues?  In reviewing the chart, on 10/24 you did go to a [facility] urgent care for chest pain/shortness of breath, ending up with chest xray and CT to rule out pulmonary embolism? And they ruled it out.  Are things better from that or worse?  Are you still using albuterol inhaler every 4-6 hours to keep the airways open?  Completed the Prednisone?  Still on Doxycycline?  Did you ever have any lung conditions when you were younger? Like asthma or frequent bronchitis infection?  Often COVID is leaving people with some restrictive airway issues.  And we have ben sending them to a pulmonologist for further evaluation.  Sorry to ask so many questions but your symptoms are possible more complicated than most.  Particia Nearing

## 2019-02-17 ENCOUNTER — Telehealth: Payer: Self-pay | Admitting: Family Medicine

## 2019-02-17 MED ORDER — PREDNISONE 10 MG (21) PO TBPK: ORAL_TABLET | ORAL | 0 refills | Status: DC

## 2019-02-17 NOTE — Telephone Encounter (Signed)
If breathing worsens he needs to go to ED.

## 2019-02-17 NOTE — Telephone Encounter (Signed)
Please refer to phone note from 10/27- patient has COVID and per phone note on 10/27 his prednisone rx was supposed to be extended and re sent.  Was never sent to pharmacy.  [facility] [place].  Covering PCP

## 2019-02-17 NOTE — Telephone Encounter (Signed)
Patient aware and verbalizes understanding. 

## 2019-02-22 ENCOUNTER — Ambulatory Visit: Payer: BC Managed Care – PPO | Admitting: Family

## 2019-02-28 ENCOUNTER — Ambulatory Visit (INDEPENDENT_AMBULATORY_CARE_PROVIDER_SITE_OTHER): Payer: BC Managed Care – PPO | Admitting: Family Medicine

## 2019-02-28 VITALS — HR 110

## 2019-02-28 DIAGNOSIS — U071 COVID-19: Secondary | ICD-10-CM

## 2019-02-28 DIAGNOSIS — R0600 Dyspnea, unspecified: Secondary | ICD-10-CM

## 2019-02-28 DIAGNOSIS — K769 Liver disease, unspecified: Secondary | ICD-10-CM | POA: Diagnosis not present

## 2019-02-28 DIAGNOSIS — R0609 Other forms of dyspnea: Secondary | ICD-10-CM

## 2019-02-28 DIAGNOSIS — R911 Solitary pulmonary nodule: Secondary | ICD-10-CM | POA: Diagnosis not present

## 2019-02-28 DIAGNOSIS — R918 Other nonspecific abnormal finding of lung field: Secondary | ICD-10-CM | POA: Insufficient documentation

## 2019-02-28 MED ORDER — PREDNISONE 10 MG (21) PO TBPK: ORAL_TABLET | ORAL | 0 refills | Status: DC

## 2019-02-28 NOTE — Progress Notes (Signed)
Telephone visit  Subjective: CC: Hospital follow up PCP: [name], DO Scott Rasmussen is a 53 y.o. male calls for telephone consult today. Patient provides verbal consent for consult held via phone.  Location of patient: home Location of provider: Working remotely from home Others present for call: none  1. Hospital follow up COVID19  Patient tested positive for COVID-19 and seen in the urgent care after failing both oral steroids and antibiotics.  He was noted to be hypoxic requiring 2 to 4 L of oxygen via nasal cannula he was subsequently transported to the hospital in [place].  He was not a candidate for remdesivir secondary to being diagnosed greater than 10 days prior to presentation.  At discharge on 02/14/2019, he was no longer requiring oxygen.  He was instructed to complete his doxycycline.  He just finished another round of steroids.  Of note a pulmonary nodule and liver lesions were noted incidentally on CT scan.  Pulmonary nodule to be followed up in 6 to 12 months and liver lesions thought to be benign but also need repeat eval with CT or MRI in 6 to 12 months.  He notes that since he was discharge he has been doing okay.  He still has not regained his ability to ambulate without becoming short of breath.  He remains out of work due to this as his work requires quite a bit of physical activity.  He has a visit with the cardiologist on Wednesday.  He has not yet heard from the pulmonologist that was supposed to be referred to.  No fevers, chills, hemoptysis.  He is back to his baseline physically with the exception of breathing.  Oxygen at home has been running low 90s and pulse continues to run in the low 100s.  ROS: Per HPI  Allergies  Allergen Reactions  . Depo-Medrol [Methylprednisolone Acetate] Rash   Past Medical History:  Diagnosis Date  . Gout   . Hallux rigidus 10/11/2018   right   . Hyperlipidemia   . Shortness of breath   . Sleep apnea    had sx    . Umbilical hernia     Current Outpatient Medications:  .  albuterol (VENTOLIN HFA) 108 (90 Base) MCG/ACT inhaler, Inhale 2 puffs into the lungs every 6 (six) hours as needed for wheezing or shortness of breath., Disp: 18 g, Rfl: 0 .  doxycycline (VIBRA-TABS) 100 MG tablet, Take 1 tablet (100 mg total) by mouth 2 (two) times daily., Disp: 20 tablet, Rfl: 0 .  febuxostat (ULORIC) 40 MG tablet, Take 1 tablet (40 mg total) by mouth daily., Disp: 90 tablet, Rfl: 3 .  Fluocinonide Emulsified Base 0.05 % CREA, APPLY ONE APPLICATION TOPICALLY TWICE DAILY, Disp: 30 g, Rfl: 2 .  fluticasone (FLONASE) 50 MCG/ACT nasal spray, Place 2 sprays into both nostrils daily., Disp: 48 g, Rfl: 3 .  HYDROcodone-homatropine (HYCODAN) 5-1.5 MG/5ML syrup, Take 5 mLs by mouth every 6 (six) hours as needed for cough., Disp: 120 mL, Rfl: 0 .  hydrocortisone (ANUSOL-HC) 2.5 % rectal cream, Place 1 application rectally 2 (two) times daily., Disp: 30 g, Rfl: 1 .  Icosapent Ethyl 1 g CAPS, Take 2 capsules (2 g total) by mouth 2 (two) times daily., Disp: 360 capsule, Rfl: 3 .  loratadine (CLARITIN) 10 MG tablet, Take 1 tablet (10 mg total) by mouth daily., Disp: 90 tablet, Rfl: 3 .  meloxicam (MOBIC) 15 MG tablet, Take 1 tablet (15 mg total) by mouth daily., Disp:  30 tablet, Rfl: 0 .  omeprazole (PRILOSEC) 40 MG capsule, Take 1 capsule (40 mg total) by mouth daily., Disp: 90 capsule, Rfl: 3 .  predniSONE (STERAPRED UNI-PAK 21 TAB) 10 MG (21) TBPK tablet, Use as directed, Disp: 21 tablet, Rfl: 0 .  Vitamin D, Ergocalciferol, (DRISDOL) 1.25 MG (50000 UT) CAPS capsule, Take 1 capsule (50,000 Units total) by mouth every 7 (seven) days., Disp: 12 capsule, Rfl: 3  Pulse (!) 110, SpO2 92 %. Gen: does not sound distressed Pulm: no dyspnea with speech, no wheezing heard  Assessment/ Plan: 53 y.o. male   1. Dyspnea on exertion I reviewed the medical chart and discharge summary.  I cannot see where he had a referral placed to  pulmonology but I agree that he certainly would warrant 1 given ongoing dyspnea on exertion and low end of normal of oxygenation.  Continue monitoring pulse ox.  We will try another round of steroids to see if perhaps this will be helpful.  Referral placed.  He is aware of red flag signs and symptoms warranting further evaluation emergency department. - predniSONE (STERAPRED UNI-PAK 21 TAB) 10 MG (21) TBPK tablet; Use as directed  Dispense: 21 tablet; Refill: 0 - Ambulatory referral to Pulmonology  2. COVID-19 virus infection - Ambulatory referral to Pulmonology  3. Liver lesion Plan for MRI of the liver in about 6 months.  Patient aware of need for imaging in 6 months  4. Pulmonary nodule Plan for repeat CT without contrast to monitor pulmonary nodule in 6 months.  We will try and arrange with MRI as above.  Start time: 10:00am End time: 10:14am  Total time spent on patient care (including telephone call/ virtual visit): 18 minutes  [place], Yaak [phone]

## 2019-03-01 ENCOUNTER — Telehealth: Payer: Self-pay | Admitting: Podiatry

## 2019-03-01 MED ORDER — MELOXICAM 15 MG PO TABS: 15.0000 mg | ORAL_TABLET | Freq: Every day | ORAL | 0 refills | Status: DC

## 2019-03-01 NOTE — Telephone Encounter (Signed)
I informed pt of Dr. Leigh Aurora refill of the Meloxicam and need for an appt. Pt states he is now dealing with the shortness of breath, but once better will be in to see doctor.

## 2019-03-01 NOTE — Addendum Note (Signed)
Addended by: Harriett Sine D on: 03/01/2019 10:06 AM   Modules accepted: Orders

## 2019-03-01 NOTE — Telephone Encounter (Signed)
Pt requesting refill of Meloxicam.  He cancelled his last f/u appt because he was positive for COVID-19 and is still having symptoms currently.

## 2019-03-01 NOTE — Progress Notes (Signed)
Cardiology Office Note:   Date:  03/02/2019  NAME:  Scott Rasmussen    MRN: [id number] DOB:  [birth year]   PCP:  [name], DO  Cardiologist:  No primary care provider on file.   Referring MD: Remus Loffler, PA-C   Chief Complaint  Patient presents with   Shortness of Breath   History of Present Illness:   Scott Rasmussen is a 53 y.o. male with a hx of Covid-19 infection (01/20/2019), HLD who is being seen today for the evaluation of chest pain/SOB at the request of Remus Loffler, PA-C.  He reports he was diagnosed with coronavirus on 10/2.  He apparently was symptomatic with shortness of breath and fever from 10/5-10/11.  He actually was evaluated in [facility] urgent care as well as a [facility] urgent care.  He was noted to be hypoxemic and sent for a CT scan of his chest which showed a pulmonary lung nodule and no acute lung pathology.  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  Per the reports in care everywhere this is a benign lesion and just will need a follow-up imaging study.  He reports since being diagnosed with coronavirus he gets profoundly short of breath with exertion.  He reports that routine activities such as playing golf gets him out of breath.  He states he checks his oxygen level with a home pulse oximeter and his oxygen level remains between 90-94.  He also notes intermittent increases in his heart rate on his pulse oximeter.  He reports that his heart rate is around 100 at baseline.  He states this did not happen before coronavirus.  His EKG today demonstrates normal sinus rhythm with heart rate 95.  Regarding cardiovascular disease risk factors he has history of hyperlipidemia.  He did undergo a calcium score in 2015 that was 0.  He underwent a exercise treadmill stress test in 2016 that showed no evidence of ischemic changes.  This was a low risk study.  His  father died of a heart attack at age 37.  He is a former smoker and rarely consumes alcohol.  He denies drug use.  With activity, he endorses no chest pain.  He states he notices his heart rate is fast with exertion, but has no sensation of palpitations.  Past Medical History: Past Medical History:  Diagnosis Date   Gout    Hallux rigidus 10/11/2018   right    Hyperlipidemia    Shortness of breath    Sleep apnea    had sx    Umbilical hernia     Past Surgical History: Past Surgical History:  Procedure Laterality Date   TONSILLECTOMY AND ADENOIDECTOMY  2008   UVULOPALATOPHARYNGOPLASTY  2008    Current Medications: Current Meds  Medication Sig   albuterol (VENTOLIN HFA) 108 (90 Base) MCG/ACT inhaler Inhale 2 puffs into the lungs every 6 (six) hours as needed for wheezing or shortness of breath.   febuxostat (ULORIC) 40 MG tablet Take 1 tablet (40 mg total) by mouth daily.   fluticasone (FLONASE) 50 MCG/ACT nasal spray Place 2 sprays into both nostrils daily.   Icosapent Ethyl 1 g CAPS Take 2 capsules (2 g total) by mouth 2 (two) times daily.   loratadine (CLARITIN) 10 MG tablet Take 1 tablet (10 mg total) by mouth daily.   meloxicam (MOBIC) 15 MG tablet Take  1 tablet (15 mg total) by mouth daily.   omeprazole (PRILOSEC) 40 MG capsule Take 1 capsule (40 mg total) by mouth daily.   predniSONE (STERAPRED UNI-PAK 21 TAB) 10 MG (21) TBPK tablet Use as directed   Vitamin D, Ergocalciferol, (DRISDOL) 1.25 MG (50000 UT) CAPS capsule Take 1 capsule (50,000 Units total) by mouth every 7 (seven) days.     Allergies:    Depo-medrol [methylprednisolone acetate]   Social History: Social History   Socioeconomic History   Marital status: Married    Spouse name: Not on file   Number of children: Not on file   Years of education: Not on file   Highest education level: Not on file  Occupational History   Not on file  Social Needs   Financial resource strain: Not  on file   Food insecurity    Worry: Not on file    Inability: Not on file   Transportation needs    Medical: Not on file    Non-medical: Not on file  Tobacco Use   Smoking status: Former Smoker    Quit date: 2012    Years since quitting: 8.8   Smokeless tobacco: Never Used  Substance and Sexual Activity   Alcohol use: Yes    Comment: 2 drinks per month per pt.   Drug use: No   Sexual activity: Not on file  Lifestyle   Physical activity    Days per week: Not on file    Minutes per session: Not on file   Stress: Not on file  Relationships   Social connections    Talks on phone: Not on file    Gets together: Not on file    Attends religious service: Not on file    Active member of club or organization: Not on file    Attends meetings of clubs or organizations: Not on file    Relationship status: Not on file  Other Topics Concern   Not on file  Social History Narrative   Works at [employer]      Family History: The patient's family history includes Heart disease (age of onset: 9) in his father. There is no history of Colon cancer, Esophageal cancer, Rectal cancer, or Stomach cancer.  ROS:   All other ROS reviewed and negative. Pertinent positives noted in the HPI.     EKGs/Labs/Other Studies Reviewed:   The following studies were personally reviewed by me today:  Recent lab work from primary care physician on 09/06/2018: Total cholesterol 169, HDL 38, LDL 97, triglycerides 169, TSH 1.35, serum creatinine 0.83  EKG:  EKG is ordered today.  The ekg ordered today demonstrates normal sinus rhythm, heart rate 95, normal intervals, no acute ST-T changes, no evidence of prior infarction, and was personally reviewed by me.   Recent Labs: 01/07/2019: ALT 26; BUN 17; Creatinine, Ser 0.83; Hemoglobin 17.1; Platelets 285; Potassium 4.4; Sodium 139; TSH 1.350   Recent Lipid Panel    Component Value Date/Time   CHOL 169 09/06/2018 0906   TRIG 169 (H) 09/06/2018  0906   TRIG 242 (H) 10/17/2016 0806   HDL 38 (L) 09/06/2018 0906   HDL 39 (L) 10/17/2016 0806   CHOLHDL 4.4 09/06/2018 0906   LDLCALC 97 09/06/2018 0906   LDLCALC 86 11/28/2013 1024    Physical Exam:   VS:  BP 136/84 (BP Location: Left Arm, Patient Position: Sitting, Cuff Size: Normal)    Pulse 95    Temp (!) 97.5 F (36.4 C)  Ht 5' 10.5" (1.791 m)    Wt 238 lb (108 kg)    BMI 33.67 kg/m    Wt Readings from Last 3 Encounters:  03/02/19 238 lb (108 kg)  01/07/19 232 lb (105.2 kg)  06/29/18 237 lb (107.5 kg)    General: Well nourished, well developed, in no acute distress Heart: Atraumatic, normal size  Eyes: PEERLA, EOMI  Neck: Supple, no JVD Endocrine: No thryomegaly Cardiac: Normal S1, S2; RRR; no murmurs, rubs, or gallops Lungs: Clear to auscultation bilaterally, no wheezing, rhonchi or rales  Abd: Soft, nontender, no hepatomegaly  Ext: No edema, pulses 2+ Musculoskeletal: No deformities, BUE and BLE strength normal and equal Skin: Warm and dry, no rashes   Neuro: Alert and oriented to person, place, time, and situation, CNII-XII grossly intact, no focal deficits  Psych: Normal mood and affect   ASSESSMENT:   Scott Rasmussen is a 53 y.o. male who presents for the following: 1. SOB (shortness of breath)   2. Tachycardia   3. Mixed hyperlipidemia     PLAN:   1. SOB (shortness of breath) 2. Tachycardia -Unclear etiology at this time.  Likely just related to coronavirus infection that he is getting over.  I suspect this is more of a pulmonary problem.  His EKG is normal sinus rhythm without any acute ST-T changes or evidence of prior infarction.  We will proceed with a echocardiogram to exclude any structural heart disease.  We also will obtain a Lexi scan nuclear medicine stress test to include CAD.  He did have a coronary calcium score of 0 in 2015 and would be a good candidate for cardiac CTA, but given numerous recent CT scans I think we will avoid this.  He also reports  intermittent tachycardia with exertion.  I do query if there is a possible arrhythmia at play here given his recent coronavirus infection.  We will obtain a 48-hour Holter to see if there is any underlying arrhythmia.  3. Mixed hyperlipidemia -We will discuss switching him to a statin at her next visit   Disposition: Return in about 3 months (around 06/02/2019).  Medication Adjustments/Labs and Tests Ordered: Current medicines are reviewed at length with the patient today.  Concerns regarding medicines are outlined above.  Orders Placed This Encounter  Procedures   Ambulatory referral to Pulmonology   Holter monitor - 48 hour   LEXISCAN---MYOCARDIAL PERFUSION IMAGING   EKG 12-Lead   ECHOCARDIOGRAM COMPLETE   No orders of the defined types were placed in this encounter.   Patient Instructions  Medication Instructions:  NO CHANGES  *If you need a refill on your cardiac medications before your next appointment, please call your pharmacy*  Lab Work: NOT NEEDED    Testing/Procedures:  WILL BE SCHEDULE AT [place] [address] Your physician has requested that you have an echocardiogram. Echocardiography is a painless test that uses sound waves to create images of your heart. It provides your doctor with information about the size and shape of your heart and how well your hearts chambers and valves are working. This procedure takes approximately one hour. There are no restrictions for this procedure. AND Your physician has recommended that you wear a 48  holter monitor. Holter monitors are medical devices that record the hearts electrical activity. Doctors most often use these monitors to diagnose arrhythmias. Arrhythmias are problems with the speed or rhythm of the heartbeat. The monitor is a small, portable device. You can wear one while you  do your normal daily activities. This is usually used to diagnose what is causing palpitations/syncope (passing out).  WILL  BE SCHEDULE AT [address] Your physician has requested that you have a lexiscan myoview. For further information please visit [email or web address]. Please follow instruction sheet, as given.     Follow-Up: At [facility], you and your health needs are our priority.  As part of our continuing mission to provide you with exceptional heart care, we have created designated Provider Care Teams.  These Care Teams include your primary Cardiologist (physician) and Advanced Practice Providers (APPs -  Physician Assistants and Nurse Practitioners) who all work together to provide you with the care you need, when you need it.  Your next appointment:   3 months  The format for your next appointment:   In Person  Provider:   Lennie OdorWesley O'Neal, MD  Other Instructions   You have been [facility]- DR Mechele CollinJANE ELLISON FOR SHORTNESS OF BREATH ,RECNT COVID]    Signed, Lenna GilfordWesley T. Flora Lipps'Neal, MD [facility]   [facility]  [address] [place], [place] [address] [phone]  03/02/2019 9:37 AM

## 2019-03-02 ENCOUNTER — Ambulatory Visit: Payer: BC Managed Care – PPO | Admitting: Cardiovascular Disease

## 2019-03-02 ENCOUNTER — Other Ambulatory Visit: Payer: Self-pay

## 2019-03-02 ENCOUNTER — Encounter: Payer: Self-pay | Admitting: Cardiovascular Disease

## 2019-03-02 VITALS — BP 136/84 | HR 95 | Temp 97.5°F | Ht 70.5 in | Wt 238.0 lb

## 2019-03-02 DIAGNOSIS — R000 Tachycardia, unspecified: Secondary | ICD-10-CM

## 2019-03-02 DIAGNOSIS — R0602 Shortness of breath: Secondary | ICD-10-CM | POA: Diagnosis not present

## 2019-03-02 DIAGNOSIS — E782 Mixed hyperlipidemia: Secondary | ICD-10-CM | POA: Diagnosis not present

## 2019-03-02 NOTE — Patient Instructions (Signed)
Medication Instructions:  NO CHANGES  *If you need a refill on your cardiac medications before your next appointment, please call your pharmacy*  Lab Work: NOT NEEDED    Testing/Procedures:  WILL BE SCHEDULE AT [place] [address] Your physician has requested that you have an echocardiogram. Echocardiography is a painless test that uses sound waves to create images of your heart. It provides your doctor with information about the size and shape of your heart and how well your heart's chambers and valves are working. This procedure takes approximately one hour. There are no restrictions for this procedure. AND Your physician has recommended that you wear a 48  holter monitor. Holter monitors are medical devices that record the heart's electrical activity. Doctors most often use these monitors to diagnose arrhythmias. Arrhythmias are problems with the speed or rhythm of the heartbeat. The monitor is a small, portable device. You can wear one while you do your normal daily activities. This is usually used to diagnose what is causing palpitations/syncope (passing out).  WILL BE SCHEDULE AT [place] Your physician has requested that you have a lexiscan myoview. For further information please visit [email or web address]. Please follow instruction sheet, as given.     Follow-Up: At [facility], you and your health needs are our priority.  As part of our continuing mission to provide you with exceptional heart care, we have created designated Provider Care Teams.  These Care Teams include your primary Cardiologist (physician) and Advanced Practice Providers (APPs -  Physician Assistants and Nurse Practitioners) who all work together to provide you with the care you need, when you need it.  Your next appointment:   3 months  The format for your next appointment:   In Person  Provider:   Eleonore Chiquito, MD  Other Instructions   You have been referred to  [place]

## 2019-03-10 ENCOUNTER — Telehealth (HOSPITAL_COMMUNITY): Payer: Self-pay

## 2019-03-10 NOTE — Telephone Encounter (Signed)
Encounter complete. 

## 2019-03-11 ENCOUNTER — Telehealth: Payer: Self-pay

## 2019-03-11 NOTE — Telephone Encounter (Signed)
3 day ZIO ordered and mailed to pt. Order changed from Holter to Long Term due to us not bringing pt's in the office at this time.  

## 2019-03-14 ENCOUNTER — Encounter: Payer: Self-pay | Admitting: Pulmonary Disease

## 2019-03-14 ENCOUNTER — Ambulatory Visit: Payer: BC Managed Care – PPO | Admitting: Pulmonary Disease

## 2019-03-14 ENCOUNTER — Other Ambulatory Visit: Payer: Self-pay

## 2019-03-14 ENCOUNTER — Ambulatory Visit (HOSPITAL_COMMUNITY): Payer: BC Managed Care – PPO | Attending: Cardiovascular Disease

## 2019-03-14 ENCOUNTER — Institutional Professional Consult (permissible substitution): Payer: BC Managed Care – PPO | Admitting: Pulmonary Disease

## 2019-03-14 VITALS — BP 126/88 | HR 87 | Temp 98.0°F | Ht 70.0 in | Wt 237.8 lb

## 2019-03-14 DIAGNOSIS — R0609 Other forms of dyspnea: Secondary | ICD-10-CM

## 2019-03-14 DIAGNOSIS — R0600 Dyspnea, unspecified: Secondary | ICD-10-CM

## 2019-03-14 DIAGNOSIS — R000 Tachycardia, unspecified: Secondary | ICD-10-CM

## 2019-03-14 DIAGNOSIS — R0602 Shortness of breath: Secondary | ICD-10-CM | POA: Diagnosis not present

## 2019-03-14 DIAGNOSIS — Z8619 Personal history of other infectious and parasitic diseases: Secondary | ICD-10-CM | POA: Diagnosis not present

## 2019-03-14 DIAGNOSIS — Z8616 Personal history of COVID-19: Secondary | ICD-10-CM | POA: Insufficient documentation

## 2019-03-14 MED ORDER — BREO ELLIPTA 100-25 MCG/INH IN AEPB: 1.0000 | INHALATION_SPRAY | Freq: Every day | RESPIRATORY_TRACT | 0 refills | Status: DC

## 2019-03-14 NOTE — Patient Instructions (Signed)
Dyspnea on exertion --START Breo 100 -25 mcg ONE PUFF ONCE A DAY. This is your EVERYDAY inhaler. --CONTINUE Albuterol as needed for shortness of breath  --We will arrange for pulmonary function test in January 2021 (three months post-covid symptoms) --We will schedule 6 minute walk test to evaluate your baseline function --Our office will request CT Chest from 02/12/19  Continue regular aerobic exercise to build up your strength and stamina  Follow-up with Dr. Loanne Drilling or NP after PFTs

## 2019-03-14 NOTE — Addendum Note (Signed)
Addended by: Amado Coe on: 03/14/2019 12:44 PM   Modules accepted: Orders

## 2019-03-14 NOTE — Progress Notes (Signed)
Patient seen in the office today and instructed on use of ellipta.  Patient expressed understanding and demonstrated technique.  

## 2019-03-14 NOTE — Progress Notes (Signed)
  Subjective:   PATIENT ID: Scott Rasmussen, MRN: [id number]   HPI  Chief Complaint  Patient presents with  . Consult    oxygen levels drop low 90's and patietn is short of breath dx covid 19 01/2019   Reason for Visit: New consult for shortness of breath, recent COVID-19 infection  Mr. Scott Rasmussen is a 53-year-old male with hyperlipidemia who presents as a referral from cardiology for shortness of breath.    Records in EMR from Cardiology and CareEverywhere. He was recently diagnosed with COVID-19 infection on 10/2 and developed fever, headache, shortness of breath, cough, palpitation and chest tightness from 10/5-10/11.  He was evaluated at [facility] [place], . Although most of his symptoms are resolved, he continued to have shortness of breath. He has been treated with doxycycline, steroids x 3 courses and albuterol inhaler without improvement. He has not felt like he has completely covered since his diagnosis. For his persistent dyspnea he underwent CTA at [facility] on 02/12/2019 which was negative for PE but did demonstrate 6 mm lung nodule in the right upper lobe.   He has tried to play golf again but he is completely drained of energy associated with shortness of breath with exertion. He does have to catch his breath while walking a block. 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 He is not able to work due to his symptoms. Denies wheezing, coughing.   Social History: Former smoker 1 pack per day . Quit smoking in 2012. But will intermittently smoke including for one to two months prior to having COVID.  I have personally reviewed patient's past medical/family/social history, allergies, current medications.  Past Medical History:  Diagnosis Date  . Gout   . Hallux rigidus 10/11/2018   right   . Hyperlipidemia   .  Shortness of breath   . Sleep apnea    had sx   . Umbilical hernia      Family History  Problem Relation Age of Onset  . Heart disease Father 41       heart attack  . Colon cancer Neg Hx   . Esophageal cancer Neg Hx   . Rectal cancer Neg Hx   . Stomach cancer Neg Hx      Social History   Occupational History  . Not on file  Tobacco Use  . Smoking status: Former Smoker    Quit date: 2012    Years since quitting: 8.9  . Smokeless tobacco: Never Used  Substance and Sexual Activity  . Alcohol use: Yes    Comment: 2 drinks per month per pt.  . Drug use: No  . Sexual activity: Not on file    Allergies  Allergen Reactions  . Depo-Medrol [Methylprednisolone Sodium Succ] Rash     Outpatient Medications Prior to Visit  Medication Sig Dispense Refill  . albuterol (VENTOLIN HFA) 108 (90 Base) MCG/ACT inhaler Inhale 2 puffs into the lungs every 6 (six) hours as needed for wheezing or shortness of breath. 18 g 0  . febuxostat (ULORIC) 40 MG tablet Take 1 tablet (40 mg total) by mouth daily. 90 tablet 3  . fluticasone (FLONASE) 50 MCG/ACT nasal spray Place 2 sprays into both nostrils daily. 48 g 3  . Icosapent Ethyl 1 g CAPS Take 2 capsules (2 g total) by mouth 2 (two) times daily.   360 capsule 3  . loratadine (CLARITIN) 10 MG tablet Take 1 tablet (10 mg total) by mouth daily. 90 tablet 3  . meloxicam (MOBIC) 15 MG tablet Take 1 tablet (15 mg total) by mouth daily. 30 tablet 0  . omeprazole (PRILOSEC) 40 MG capsule Take 1 capsule (40 mg total) by mouth daily. 90 capsule 3  . predniSONE (STERAPRED UNI-PAK 21 TAB) 10 MG (21) TBPK tablet Use as directed 21 tablet 0  . Vitamin D, Ergocalciferol, (DRISDOL) 1.25 MG (50000 UT) CAPS capsule Take 1 capsule (50,000 Units total) by mouth every 7 (seven) days. 12 capsule 3   No facility-administered medications prior to visit.     Review of Systems  Constitutional: Negative for chills, diaphoresis, fever, malaise/fatigue and weight loss.   HENT: Negative for congestion, ear pain and sore throat.   Respiratory: Positive for shortness of breath. Negative for cough, hemoptysis, sputum production and wheezing.   Cardiovascular: Negative for chest pain, palpitations and leg swelling.  Gastrointestinal: Positive for heartburn. Negative for abdominal pain and nausea.  Genitourinary: Negative for frequency.  Musculoskeletal: Negative for joint pain and myalgias.  Skin: Negative for itching and rash.  Neurological: Negative for dizziness, weakness and headaches.  Endo/Heme/Allergies: Does not bruise/bleed easily.  Psychiatric/Behavioral: Negative for depression. The patient is not nervous/anxious.      Objective:   Vitals:   03/14/19 1035  BP: 126/88  Pulse: 87  Temp: 98 F (36.7 C)  TempSrc: Temporal  SpO2: 96%  Weight: 237 lb 12.8 oz (107.9 kg)  Height: 5' 10" (1.778 m)      Physical Exam: General: Well-appearing, no acute distress HENT: [place], AT Eyes: EOMI, no scleral icterus Respiratory: Clear to auscultation bilaterally.  No crackles, wheezing or rales Cardiovascular: RRR, -M/R/G, no JVD GI: BS+, soft, nontender Extremities:-Edema,-tenderness Neuro: AAO x4, CNII-XII grossly intact Skin: Intact, no rashes or bruising Psych: Normal mood, normal affect  Data Reviewed:  Imaging: CTA 02/12/2019 from [facility] (report only)  IMPRESSION:  1. No evidence of acute pulmonary emboli. 2. 6 mm RIGHT upper lobe pulmonary nodule. Please see recommendations below. 3. 2.7 x 1.7 x 2.3 cm enhancing RIGHT hepatic lobe lesion is indeterminate. Consider follow-up multiphase hepatic CT or MRI. 4. Nonobstructing LEFT renal calculi.  PFT: None on file  Labs: [facility] Sodium 136 135 - 145 mmol/L  Potassium 4.6 3.5 - 5 mmol/L  Chloride 103 98 - 108 mmol/L  Carbon Dioxide (CO2) 19 (L) 21 - 30 mmol/L  Urea Nitrogen (BUN) 16 7 - 20 mg/dL  Creatinine 0.9 0.6 - 1.3 mg/dL  Glucose 129  Comment: Interpretive Data: Above  is the NONFASTING reference range.   Below are the FASTING reference ranges:  NORMAL:   70-99 mg/dL  PREDIABETES: 100-125 mg/dL  DIABETES:  > 125 mg/dL  70 - 140 mg/dL  Calcium 9.8 8.7 - 10.2 mg/dL  AST (Aspartate Aminotransferase) 34 15 - 41 U/L  ALT (Alanine Aminotransferase) 43 17 - 63 U/L  Bilirubin, Total 0.4 0.4 - 1.5 mg/dL  Alk Phos (Alkaline Phosphatase) 77 24 - 110 U/L  Albumin 4.4 3.5 - 4.8 g/dL  Protein, Total 7.2 6.2 - 8.1 g/dL  Anion Gap 14 (H) 3 - 12 mmol/L  BUN/CREA Ratio 18 6 - 27    03/14/19 In-clinic ambulatory oxygen readings were normal with nadir 97%.   Imaging, labs and tests noted above have been reviewed independently by me.    Assessment & Plan:   Discussion: 53 year old male with prior   COVID-19 infection diagnosed on 01/21/19 and HLD who presents for persistent dyspnea on exertion since October. He is undergoing cardio work-up with echocardiogram, Holter monitor and stress test however his symptoms could represent a co-comitant pulmonary process in the setting of recent coronavirus infection as well as deconditioning related to his viral illness. Fortunately, his recent CTA is negative for PE. We will obtain disc of imaging to evaluate for parenchymal abnormalities. Will also plan for pulmonary function test to rule out underlying obstructive or restrictive process that may be a sequelae of his infection. Unfortunately, he will not be eligible for PFTs until 3 months after his last positive COVID test. For his dyspnea, will trial ICS/LABA to see if this provides any benefit.  Dyspnea on exertion --START Breo 100 -25 mcg ONE PUFF ONCE A DAY. This is your EVERYDAY inhaler. --CONTINUE Albuterol as needed for shortness of breath  --We will arrange for pulmonary function test in January 2021 (three months post-covid symptoms) --We will schedule 6 minute walk test to evaluate your baseline function --Our office will request CT Chest from 02/12/19  Continue  regular aerobic exercise to build up your strength and stamina  Follow-up with Dr. Ellison or NP after PFTs  Health Maintenance Immunization History  Administered Date(s) Administered  . Influenza,inj,Quad PF,6+ Mos 02/21/2013, 01/16/2014, 01/30/2015, 03/03/2016, 01/27/2017, 02/16/2018, 01/07/2019  . Tdap 11/16/2012  . Zoster Recombinat (Shingrix) 03/22/2018, 06/14/2018    No orders of the defined types were placed in this encounter. No orders of the defined types were placed in this encounter.   Return for after PFTs.  Chi Jane Ellison, MD [facility] 03/14/2019 8:38 AM  Office Number [phone]   

## 2019-03-15 ENCOUNTER — Ambulatory Visit (HOSPITAL_COMMUNITY)
Admission: RE | Admit: 2019-03-15 | Discharge: 2019-03-15 | Disposition: A | Discharge status: Home / Self Care | Payer: BC Managed Care – PPO | Source: Ambulatory Visit | Attending: Cardiology | Admitting: Cardiology

## 2019-03-15 DIAGNOSIS — R0602 Shortness of breath: Secondary | ICD-10-CM | POA: Diagnosis not present

## 2019-03-15 LAB — MYOCARDIAL PERFUSION IMAGING
LV dias vol: 79 mL (ref 62–150)
LV sys vol: 37 mL
Peak HR: 103 {beats}/min
Rest HR: 81 {beats}/min
SDS: 5
SRS: 0
SSS: 5
TID: 1.07

## 2019-03-15 MED ORDER — TECHNETIUM TC 99M TETROFOSMIN IV KIT
29.6000 | PACK | Freq: Once | INTRAVENOUS | Status: AC | PRN
  Administered 2019-03-15: 29.6 via INTRAVENOUS
  Filled 2019-03-15: qty 30

## 2019-03-15 MED ORDER — TECHNETIUM TC 99M TETROFOSMIN IV KIT
9.0000 | PACK | Freq: Once | INTRAVENOUS | Status: AC | PRN
  Administered 2019-03-15: 9 via INTRAVENOUS
  Filled 2019-03-15: qty 9

## 2019-03-15 MED ORDER — REGADENOSON 0.4 MG/5ML IV SOLN
0.4000 mg | Freq: Once | INTRAVENOUS | Status: AC
  Administered 2019-03-15: 0.4 mg via INTRAVENOUS

## 2019-03-16 ENCOUNTER — Telehealth: Payer: Self-pay | Admitting: Cardiovascular Disease

## 2019-03-16 MED ORDER — ROSUVASTATIN CALCIUM 20 MG PO TABS: 20.0000 mg | ORAL_TABLET | Freq: Every day | ORAL | 0 refills | Status: DC

## 2019-03-16 MED ORDER — METOPROLOL TARTRATE 25 MG PO TABS: 25.0000 mg | ORAL_TABLET | Freq: Two times a day (BID) | ORAL | 3 refills | Status: DC

## 2019-03-16 MED ORDER — ROSUVASTATIN CALCIUM 20 MG PO TABS: 20.0000 mg | ORAL_TABLET | Freq: Every day | ORAL | 3 refills | Status: DC

## 2019-03-16 MED ORDER — METOPROLOL TARTRATE 25 MG PO TABS: 25.0000 mg | ORAL_TABLET | Freq: Two times a day (BID) | ORAL | 0 refills | Status: DC

## 2019-03-16 NOTE — Telephone Encounter (Signed)
Called Mr. Henken. Small defects seen anterior wall concerning for ischemia. Denies any chest pain. Still short of breath with exertion. Will start ASA 81 mg daily, crestor 20 mg QHS, and metoprolol tartrate 25 mg BID. Sent 30 days to [facility] in [place]. Also, sent 90 day prescriptions with [facility] for [facility]-term use. Will see him back in a few months to reassess symptoms. He was in agreement with this plan.   [place] T. Audie Box, [place]  [address] [place], [place] [address] [phone]  9:31 AM

## 2019-03-16 NOTE — Telephone Encounter (Signed)
Patient is calling back to you Dr.O'Neal. Thanks!

## 2019-03-16 NOTE — Telephone Encounter (Signed)
Patient states he just spoke with Dr. Audie Box and just has one more question he wanted to ask. Would not disclose what the question was. I did let him know that Dr. Audie Box was out of the office today and not sure when he would get back in touch with him.

## 2019-03-21 ENCOUNTER — Ambulatory Visit (INDEPENDENT_AMBULATORY_CARE_PROVIDER_SITE_OTHER): Payer: BC Managed Care – PPO

## 2019-03-21 ENCOUNTER — Encounter: Payer: Self-pay | Admitting: Family Medicine

## 2019-03-21 ENCOUNTER — Other Ambulatory Visit: Payer: Self-pay

## 2019-03-21 ENCOUNTER — Telehealth: Payer: Self-pay | Admitting: Cardiovascular Disease

## 2019-03-21 ENCOUNTER — Other Ambulatory Visit (INDEPENDENT_AMBULATORY_CARE_PROVIDER_SITE_OTHER): Payer: BC Managed Care – PPO

## 2019-03-21 DIAGNOSIS — R000 Tachycardia, unspecified: Secondary | ICD-10-CM | POA: Diagnosis not present

## 2019-03-21 DIAGNOSIS — R0609 Other forms of dyspnea: Secondary | ICD-10-CM

## 2019-03-21 DIAGNOSIS — R0600 Dyspnea, unspecified: Secondary | ICD-10-CM

## 2019-03-21 NOTE — Telephone Encounter (Signed)
Discussed heart burn symptoms with Mr. Meschke. Seems to be helped by rolaids. I have low suspicion this is cardiac. Tolerating metorpolol, ASA/statin well. Will get him in to see me in January.   [place] T. Audie Box, [place]  [address], [place], [place] [address] [phone]  3:58 PM

## 2019-03-21 NOTE — Addendum Note (Signed)
Addended by: Valerie Salts on: 03/21/2019 03:37 PM   Modules accepted: Level of Service

## 2019-03-21 NOTE — Progress Notes (Signed)
SIX MIN WALK 03/21/2019 03/14/2019  Medications Uloric 40mg , Breo 100-17mcg, Flonase, Claritin 10mg , metoprolol 25mg , omeprazole 40mg , Crestor 20mg . All were taken around 7am today. -  Supplimental Oxygen during Test? (L/min) No No  Laps 12 -  Partial Lap (in Meters) 6 -  Baseline BP (sitting) 138/86 -  Baseline Heartrate 98 -  Baseline Dyspnea (Borg Scale) 3 -  Baseline Fatigue (Borg Scale) 1 -  Baseline SPO2 95 -  BP (sitting) 148/90 -  Heartrate 111 -  Dyspnea (Borg Scale) 3 -  Fatigue (Borg Scale) 2 -  SPO2 94 -  BP (sitting) 146/90 -  Heartrate 105 -  SPO2 94 -  Stopped or Paused before Six Minutes No -  Distance Completed 414 -  Tech Comments: Patient was able to complete walk at a steady pace. Denied any SOB, chest pain or leg pain during walk. No O2 was needed during or after walk. patient walked moderate pace all three laps, did not feel dizzy, or lightheaded, patient tolerated walk well

## 2019-03-24 ENCOUNTER — Encounter (HOSPITAL_BASED_OUTPATIENT_CLINIC_OR_DEPARTMENT_OTHER): Payer: Self-pay

## 2019-03-24 ENCOUNTER — Ambulatory Visit (HOSPITAL_BASED_OUTPATIENT_CLINIC_OR_DEPARTMENT_OTHER): Admit: 2019-03-24 | Payer: BC Managed Care – PPO | Admitting: Orthopedic Surgery

## 2019-03-24 SURGERY — FUSION, JOINT, GREAT TOE
Anesthesia: Monitor Anesthesia Care | Site: Toe | Laterality: Right

## 2019-04-04 ENCOUNTER — Other Ambulatory Visit: Payer: Self-pay | Admitting: Cardiovascular Disease

## 2019-04-04 ENCOUNTER — Other Ambulatory Visit: Payer: Self-pay | Admitting: Family Medicine

## 2019-04-04 ENCOUNTER — Encounter: Payer: Self-pay | Admitting: Family Medicine

## 2019-04-04 ENCOUNTER — Telehealth: Payer: Self-pay | Admitting: Pulmonary Disease

## 2019-04-04 MED ORDER — BREO ELLIPTA 100-25 MCG/INH IN AEPB: 1.0000 | INHALATION_SPRAY | Freq: Every day | RESPIRATORY_TRACT | 9 refills | Status: DC

## 2019-04-04 MED ORDER — PREDNISONE 10 MG (21) PO TBPK: ORAL_TABLET | ORAL | 0 refills | Status: DC

## 2019-04-04 NOTE — Telephone Encounter (Signed)
Patient sent this message to you this afternoon.   Hey Dr Loanne Drilling, I have to sign some disability papers for u to release my medical records from company disability. I'm assuming after breathing test January we'll discuss my returning to work? As of now there's no possibility for me to perform my job duties of walking 15-20000 steps per day. I'm still having the shortness of breath but I feel it's gradually gotten a little better. Thanks Scott Rasmussen    Message routed to Dr. Loanne Drilling who will return to office 04/05/19.

## 2019-04-04 NOTE — Telephone Encounter (Signed)
Spoke with patient. He states he was given a starter sample and was told if the inhaler was working for him to call in for a Rx to be sent. Patient wants inhaler sent to pharmacy. Order placed.  Nothing further needed at this time.

## 2019-04-05 NOTE — Telephone Encounter (Signed)
[facility] Telephone Encounter  I contacted Mr. Scott Rasmussen via telephone. I have asked him to send the paperwork/fax disability paperwork request to our office. His current symptoms seem respiratory related with his recent COVID-19 infection. He reports minimal improvement with ICS/LABA inhaler and continues to be compliant with it. I encouraged him to continue until we can complete his PFTs and can discuss discontinuation if results are normal.  Lauren,  Can you see if his CT Chest imaging and report are available yet. Also, please send me the results of his 6MWT.  Rodman Pickle, M.D. [facility] 04/05/2019 8:42 AM

## 2019-04-14 ENCOUNTER — Encounter: Payer: Self-pay | Admitting: Family Medicine

## 2019-04-25 NOTE — Progress Notes (Signed)
Cardiology Office Note:   Date:  04/26/2019  NAME:  Scott Rasmussen    MRN: [id number] DOB:  June 02, 1965   PCP:  Janora Norlander, DO  Cardiologist:  Evalina Field, MD   Referring MD: Janora Norlander, DO   Chief Complaint  Patient presents with  . Coronary Artery Disease   History of Present Illness:   Scott Rasmussen is a 54 y.o. male with a hx of CAD, Covid 19 PNA who presents for follow-up of SOB. Started on ASA/statin/BB over phone in November. Holter ordered but not obtained.  He is doing well since starting on aspirin, Crestor, metoprolol.  He reports he still gets short of breath and is working with his pulmonologist for this.  He reports inability to walk more than 1/2 mile.  He states he can still play golf but these activities getting severely short of breath.  He also continues to have episodic episodes of heartburn every 2 weeks.  This is improved with Prilosec.  He also reports bloating and swelling in his abdomen.  He has had no episodes of chest pain with exertion and no further palpitations.  With his current level of activity he seems to be doing quite well.  He has not returned to work.  He works on the Museum/gallery curator.  For [employer].  I was able to obtain his Zio patch for 3 days duration this showed no significant arrhythmias, but brief runs of SVT.  He had one episode of SVT that lasted 6 beat in duration, 3.1 seconds.  No symptoms reported with it.  This does not require treatment and he is on metoprolol.  There was no documentation of atrial fibrillation on the monitor.  Problem List 1. Covid-19 PNA 2. CAD (mild ischemia in basal to mid anterior wall, intermediate risk, NM SPECT 03/15/2019) 3. HLD (08/2018: T chol 169, HDL 38, LDL 97) 4. Former tobacco abuse  Past Medical History: Past Medical History:  Diagnosis Date  . Gout   . Hallux rigidus 10/11/2018   right   . Hyperlipidemia   . Shortness of breath   . Sleep apnea    had sx   . Umbilical  hernia     Past Surgical History: Past Surgical History:  Procedure Laterality Date  . TONSILLECTOMY AND ADENOIDECTOMY  2008  . UVULOPALATOPHARYNGOPLASTY  2008    Current Medications: Current Meds  Medication Sig  . albuterol (VENTOLIN HFA) 108 (90 Base) MCG/ACT inhaler Inhale 2 puffs into the lungs every 6 (six) hours as needed for wheezing or shortness of breath.  . febuxostat (ULORIC) 40 MG tablet Take 1 tablet (40 mg total) by mouth daily.  . fluticasone (FLONASE) 50 MCG/ACT nasal spray Place 2 sprays into both nostrils daily.  . fluticasone furoate-vilanterol (BREO ELLIPTA) 100-25 MCG/INH AEPB Inhale 1 puff into the lungs daily.  Marland Kitchen loratadine (CLARITIN) 10 MG tablet Take 1 tablet (10 mg total) by mouth daily.  . meloxicam (MOBIC) 15 MG tablet Take 1 tablet (15 mg total) by mouth daily.  . metoprolol tartrate (LOPRESSOR) 25 MG tablet Take 1 tablet (25 mg total) by mouth 2 (two) times daily.  . metoprolol tartrate (LOPRESSOR) 25 MG tablet Take 1 tablet (25 mg total) by mouth 2 (two) times daily.  Marland Kitchen omeprazole (PRILOSEC) 40 MG capsule Take 1 capsule (40 mg total) by mouth daily.  . predniSONE (STERAPRED UNI-PAK 21 TAB) 10 MG (21) TBPK tablet As directed x 6 days  . rosuvastatin (CRESTOR) 20  MG tablet Take 1 tablet (20 mg total) by mouth daily.  . rosuvastatin (CRESTOR) 20 MG tablet Take 1 tablet (20 mg total) by mouth daily.  . Vitamin D, Ergocalciferol, (DRISDOL) 1.25 MG (50000 UT) CAPS capsule Take 1 capsule (50,000 Units total) by mouth every 7 (seven) days.     Allergies:    Depo-medrol [methylprednisolone sodium succ]   Social History: Social History   Socioeconomic History  . Marital status: Married    Spouse name: Not on file  . Number of children: Not on file  . Years of education: Not on file  . Highest education level: Not on file  Occupational History  . Not on file  Tobacco Use  . Smoking status: Former Smoker    Packs/day: 1.00    Years: 15.00    Pack  years: 15.00    Types: Cigarettes    Quit date: 2012    Years since quitting: 9.0  . Smokeless tobacco: Never Used  Substance and Sexual Activity  . Alcohol use: Yes    Comment: 2 drinks per month per pt.  . Drug use: Not Currently    Types: Marijuana  . Sexual activity: Not on file  Other Topics Concern  . Not on file  Social History Narrative   Works at [employer] Strain:   . Difficulty of Paying Living Expenses: Not on file  Food Insecurity:   . Worried About Programme researcher, broadcasting/film/video in the Last Year: Not on file  . Ran Out of Food in the Last Year: Not on file  Transportation Needs:   . Lack of Transportation (Medical): Not on file  . Lack of Transportation (Non-Medical): Not on file  Physical Activity:   . Days of Exercise per Week: Not on file  . Minutes of Exercise per Session: Not on file  Stress:   . Feeling of Stress : Not on file  Social Connections:   . Frequency of Communication with Friends and Family: Not on file  . Frequency of Social Gatherings with Friends and Family: Not on file  . Attends Religious Services: Not on file  . Active Member of Clubs or Organizations: Not on file  . Attends Banker Meetings: Not on file  . Marital Status: Not on file     Family History: The patient's family history includes Healthy in his mother; Heart attack in his father. There is no history of Colon cancer, Esophageal cancer, Rectal cancer, or Stomach cancer.  ROS:   All other ROS reviewed and negative. Pertinent positives noted in the HPI.     EKGs/Labs/Other Studies Reviewed:   The following studies were personally reviewed by me today:   TTE 03/14/2019  1. Left ventricular ejection fraction, by visual estimation, is 60 to 65%. The left ventricle has normal function. Left ventricular septal wall thickness was moderately increased. There is moderately increased left ventricular hypertrophy.  2.  Left ventricular diastolic parameters are consistent with Grade I diastolic dysfunction (impaired relaxation).  3. Global right ventricle has normal systolic function.The right ventricular size is normal. No increase in right ventricular wall thickness.  4. Left atrial size was normal.  5. Right atrial size was normal.  6. The mitral valve is normal in structure. No evidence of mitral valve regurgitation. No evidence of mitral stenosis.  7. The tricuspid valve is normal in structure. Tricuspid valve regurgitation is mild.  8. The aortic valve is  tricuspid. Aortic valve regurgitation is not visualized. No evidence of aortic valve sclerosis or stenosis.  9. The pulmonic valve was normal in structure. Pulmonic valve regurgitation is not visualized. 10. Normal pulmonary artery systolic pressure. 11. The inferior vena cava is normal in size with greater than 50% respiratory variability, suggesting right atrial pressure of 3 mmHg.  NM SPECT 03/15/2019  There was no ST segment deviation noted during stress.  The left ventricular ejection fraction is mildly decreased (45-54%).  Nuclear stress EF: 54%.  Defect 1: There is a reversible defect of mild severity present in the basal anterior and mid anterior location.  Findings consistent with ischemia.  This is an intermediate risk study.   Basal to mid anterior ischemia.  Given normal perfusion at apex, suspect diagonal branch ischemia  Recent Labs: 01/07/2019: ALT 26; BUN 17; Creatinine, Ser 0.83; Hemoglobin 17.1; Platelets 285; Potassium 4.4; Sodium 139; TSH 1.350   Recent Lipid Panel    Component Value Date/Time   CHOL 169 09/06/2018 0906   TRIG 169 (H) 09/06/2018 0906   TRIG 242 (H) 10/17/2016 0806   HDL 38 (L) 09/06/2018 0906   HDL 39 (L) 10/17/2016 0806   CHOLHDL 4.4 09/06/2018 0906   LDLCALC 97 09/06/2018 0906   LDLCALC 86 11/28/2013 1024    Physical Exam:   VS:  BP (!) 133/94   Pulse 90   Ht 5' 10.5" (1.791 m)   Wt 246 lb  12.8 oz (111.9 kg)   SpO2 94%   BMI 34.91 kg/m    Wt Readings from Last 3 Encounters:  04/26/19 246 lb 12.8 oz (111.9 kg)  03/15/19 238 lb (108 kg)  03/14/19 237 lb 12.8 oz (107.9 kg)    General: Well nourished, well developed, in no acute distress Heart: Atraumatic, normal size  Eyes: PEERLA, EOMI  Neck: Supple, no JVD Endocrine: No thryomegaly Cardiac: Normal S1, S2; RRR; no murmurs, rubs, or gallops Lungs: Clear to auscultation bilaterally, no wheezing, rhonchi or rales  Abd: Soft, nontender, no hepatomegaly  Ext: No edema, pulses 2+ Musculoskeletal: No deformities, BUE and BLE strength normal and equal Skin: Warm and dry, no rashes   Neuro: Alert and oriented to person, place, time, and situation, CNII-XII grossly intact, no focal deficits  Psych: Normal mood and affect   ASSESSMENT:   Scott Rasmussen is a 54 y.o. male who presents for the following: 1. SOB (shortness of breath) on exertion   2. Coronary artery disease involving native coronary artery of native heart without angina pectoris   3. Mixed hyperlipidemia     PLAN:   1. SOB (shortness of breath) on exertion -Symptoms appear to be more lung related.  He has no symptoms of angina or palpitations.  Echocardiogram normal.  He had an intermediate risk stress test with mild defect in anterior wall.  I suspect this is artifact versus small diagonal lesion.  We started him on medications for this and he seems to be doing well.  No chest pain or palpitations.  His monitor also shows no significant arrhythmias.  2. Coronary artery disease involving native coronary artery of native heart without angina pectoris -Mild intermediate risk defect in the basal to mid anterior wall.  This could be artifact versus ischemia in a diagonal branch. -No symptoms of chest pain -We will continue aspirin, Crestor and metoprolol succinate 25 mg daily -We will recheck a lipid profile, A1c, CBC, BMP today -Goal cholesterol less than 70  3.  Mixed hyperlipidemia -Lipid profile  today with goal LDL less than 70  Disposition: Return in about 6 months (around 10/24/2019).  Medication Adjustments/Labs and Tests Ordered: Current medicines are reviewed at length with the patient today.  Concerns regarding medicines are outlined above.  Orders Placed This Encounter  Procedures  . Basic metabolic panel  . CBC  . HgB A1c  . Lipid Profile   No orders of the defined types were placed in this encounter.   Patient Instructions  Medication Instructions:  Your physician recommends that you continue on your current medications as directed. Please refer to the Current Medication list given to you today.  *If you need a refill on your cardiac medications before your next appointment, please call your pharmacy*  Lab Work: Your physician recommends that you return for lab work: today BASIC METABOLIC PANEL    COMPLETE BLOOD COUNT     LIPID PROFILE     HEMOGLOBIN A1C       If you have labs (blood work) drawn today and your tests are completely normal, you will receive your results only by: Marland Kitchen MyChart Message (if you have MyChart) OR . A paper copy in the mail If you have any lab test that is abnormal or we need to change your treatment, we will call you to review the results.  Testing/Procedures: NONE  Follow-Up: At [facility], you and your health needs are our priority.  As part of our continuing mission to provide you with exceptional heart care, we have created designated Provider Care Teams.  These Care Teams include your primary Cardiologist (physician) and Advanced Practice Providers (APPs -  Physician Assistants and Nurse Practitioners) who all work together to provide you with the care you need, when you need it.  Your next appointment:   6 month(s)  The format for your next appointment:   In Person  Provider:   Lennie Odor, MD      Signed, Lenna Gilford. Flora Lipps, MD [facility]  [address], [address] [place], [place] [address] [phone]  04/26/2019 9:39 AM

## 2019-04-26 ENCOUNTER — Other Ambulatory Visit: Payer: Self-pay | Admitting: Cardiovascular Disease

## 2019-04-26 ENCOUNTER — Other Ambulatory Visit: Payer: Self-pay

## 2019-04-26 ENCOUNTER — Encounter: Payer: Self-pay | Admitting: Cardiovascular Disease

## 2019-04-26 ENCOUNTER — Ambulatory Visit: Payer: BC Managed Care – PPO | Admitting: Cardiovascular Disease

## 2019-04-26 VITALS — BP 133/94 | HR 90 | Ht 70.5 in | Wt 246.8 lb

## 2019-04-26 DIAGNOSIS — E782 Mixed hyperlipidemia: Secondary | ICD-10-CM

## 2019-04-26 DIAGNOSIS — I2510 Atherosclerotic heart disease of native coronary artery without angina pectoris: Secondary | ICD-10-CM

## 2019-04-26 DIAGNOSIS — R0602 Shortness of breath: Secondary | ICD-10-CM

## 2019-04-26 DIAGNOSIS — R000 Tachycardia, unspecified: Secondary | ICD-10-CM

## 2019-04-26 LAB — CBC
Hematocrit: 50.5 % (ref 37.5–51.0)
Hemoglobin: 17.7 g/dL (ref 13.0–17.7)
MCH: 29.9 pg (ref 26.6–33.0)
MCHC: 35 g/dL (ref 31.5–35.7)
MCV: 85 fL (ref 79–97)
Platelets: 303 10*3/uL (ref 150–450)
RBC: 5.92 x10E6/uL — ABNORMAL HIGH (ref 4.14–5.80)
RDW: 13 % (ref 11.6–15.4)
WBC: 5.8 10*3/uL (ref 3.4–10.8)

## 2019-04-26 LAB — HEMOGLOBIN A1C
Est. average glucose Bld gHb Est-mCnc: 111 mg/dL
Hgb A1c MFr Bld: 5.5 % (ref 4.8–5.6)

## 2019-04-26 LAB — LIPID PANEL
Chol/HDL Ratio: 2.9 ratio (ref 0.0–5.0)
Cholesterol, Total: 109 mg/dL (ref 100–199)
HDL: 37 mg/dL — ABNORMAL LOW (ref >39)
LDL Chol Calc (NIH): 34 mg/dL (ref 0–99)
Triglycerides: 249 mg/dL — ABNORMAL HIGH (ref 0–149)
VLDL Cholesterol Cal: 38 mg/dL (ref 5–40)

## 2019-04-26 LAB — BASIC METABOLIC PANEL
BUN/Creatinine Ratio: 23 — ABNORMAL HIGH (ref 9–20)
BUN: 20 mg/dL (ref 6–24)
CO2: 23 mmol/L (ref 20–29)
Calcium: 9.9 mg/dL (ref 8.7–10.2)
Chloride: 101 mmol/L (ref 96–106)
Creatinine, Ser: 0.86 mg/dL (ref 0.76–1.27)
GFR calc Af Amer: 116 mL/min/{1.73_m2} (ref >59)
GFR calc non Af Amer: 100 mL/min/{1.73_m2} (ref >59)
Glucose: 92 mg/dL (ref 65–99)
Potassium: 4.4 mmol/L (ref 3.5–5.2)
Sodium: 139 mmol/L (ref 134–144)

## 2019-04-26 NOTE — Patient Instructions (Signed)
Medication Instructions:  Your physician recommends that you continue on your current medications as directed. Please refer to the Current Medication list given to you today.  *If you need a refill on your cardiac medications before your next appointment, please call your pharmacy*  Lab Work: Your physician recommends that you return for lab work: today BASIC METABOLIC PANEL    COMPLETE BLOOD COUNT     LIPID PROFILE     HEMOGLOBIN A1C       If you have labs (blood work) drawn today and your tests are completely normal, you will receive your results only by: Marland Kitchen MyChart Message (if you have MyChart) OR . A paper copy in the mail If you have any lab test that is abnormal or we need to change your treatment, we will call you to review the results.  Testing/Procedures: NONE  Follow-Up: At [facility], you and your health needs are our priority.  As part of our continuing mission to provide you with exceptional heart care, we have created designated Provider Care Teams.  These Care Teams include your primary Cardiologist (physician) and Advanced Practice Providers (APPs -  Physician Assistants and Nurse Practitioners) who all work together to provide you with the care you need, when you need it.  Your next appointment:   6 month(s)  The format for your next appointment:   In Person  Provider:   Lennie Odor, MD

## 2019-04-27 ENCOUNTER — Ambulatory Visit: Payer: BC Managed Care – PPO | Admitting: Internal Medicine

## 2019-04-27 ENCOUNTER — Other Ambulatory Visit: Payer: Self-pay | Admitting: Cardiovascular Disease

## 2019-04-27 ENCOUNTER — Other Ambulatory Visit: Payer: Self-pay

## 2019-04-27 DIAGNOSIS — E782 Mixed hyperlipidemia: Secondary | ICD-10-CM

## 2019-04-27 MED ORDER — ROSUVASTATIN CALCIUM 40 MG PO TABS: 40.0000 mg | ORAL_TABLET | Freq: Every day | ORAL | 3 refills | Status: DC

## 2019-04-27 NOTE — Progress Notes (Signed)
Increased crestor to 40 mg daily. [facility]. He can double up on current dose as he uses mail service for meds. Will arrange 2 month virtual follow-up with lipid and direct LDL 1 week before.   Gerri Spore T. Flora Lipps, MD [facility]  [address] [place], [place] [address] [phone]  7:40 AM

## 2019-05-02 NOTE — Telephone Encounter (Signed)
LR - can you please request CD for CT Chest image from [facility] ?

## 2019-05-06 NOTE — Telephone Encounter (Signed)
Information sent to [facility] and Hospital's , will await the CD. Fax [phone]

## 2019-05-10 NOTE — Telephone Encounter (Signed)
Sounds like a plan. -W

## 2019-05-12 NOTE — Telephone Encounter (Signed)
Hi Dr Everardo All, please advise on pt email- he is scheduled with Arlys John and for PFT on 1/26  Good morning, I'm coming in Tuesday for my breathing test & I was wondering if u had looked at my X-rays from hospital? Thanks

## 2019-05-13 ENCOUNTER — Other Ambulatory Visit (HOSPITAL_COMMUNITY): Payer: BC Managed Care – PPO

## 2019-05-14 ENCOUNTER — Other Ambulatory Visit (HOSPITAL_COMMUNITY)
Admission: RE | Admit: 2019-05-14 | Discharge: 2019-05-14 | Disposition: A | Discharge status: Home / Self Care | Payer: BC Managed Care – PPO | Source: Ambulatory Visit | Attending: Pulmonary Disease | Admitting: Pulmonary Disease

## 2019-05-14 DIAGNOSIS — Z01812 Encounter for preprocedural laboratory examination: Secondary | ICD-10-CM | POA: Insufficient documentation

## 2019-05-14 DIAGNOSIS — Z20822 Contact with and (suspected) exposure to covid-19: Secondary | ICD-10-CM | POA: Insufficient documentation

## 2019-05-14 LAB — SARS CORONAVIRUS 2 (TAT 6-24 HRS): SARS Coronavirus 2: NEGATIVE

## 2019-05-16 DIAGNOSIS — Z0289 Encounter for other administrative examinations: Secondary | ICD-10-CM

## 2019-05-17 ENCOUNTER — Ambulatory Visit: Payer: BC Managed Care – PPO | Admitting: Pulmonary Disease

## 2019-05-17 ENCOUNTER — Other Ambulatory Visit: Payer: Self-pay

## 2019-05-17 ENCOUNTER — Encounter: Payer: Self-pay | Admitting: Family Medicine

## 2019-05-17 ENCOUNTER — Encounter: Payer: Self-pay | Admitting: Pulmonary Disease

## 2019-05-17 ENCOUNTER — Ambulatory Visit (INDEPENDENT_AMBULATORY_CARE_PROVIDER_SITE_OTHER): Payer: BC Managed Care – PPO | Admitting: Pulmonary Disease

## 2019-05-17 VITALS — BP 136/90 | HR 98 | Temp 97.6°F | Ht 70.5 in | Wt 246.0 lb

## 2019-05-17 DIAGNOSIS — R0600 Dyspnea, unspecified: Secondary | ICD-10-CM

## 2019-05-17 DIAGNOSIS — Z87891 Personal history of nicotine dependence: Secondary | ICD-10-CM | POA: Diagnosis not present

## 2019-05-17 DIAGNOSIS — R911 Solitary pulmonary nodule: Secondary | ICD-10-CM | POA: Diagnosis not present

## 2019-05-17 DIAGNOSIS — Z8616 Personal history of COVID-19: Secondary | ICD-10-CM

## 2019-05-17 DIAGNOSIS — U071 COVID-19: Secondary | ICD-10-CM

## 2019-05-17 DIAGNOSIS — R0609 Other forms of dyspnea: Secondary | ICD-10-CM

## 2019-05-17 DIAGNOSIS — Z712 Person consulting for explanation of examination or test findings: Secondary | ICD-10-CM

## 2019-05-17 LAB — PULMONARY FUNCTION TEST
DL/VA % pred: 114 %
DL/VA: 4.98 ml/min/mmHg/L
DLCO cor % pred: 101 %
DLCO cor: 31.34 ml/min/mmHg
DLCO unc % pred: 109 %
DLCO unc: 33.78 ml/min/mmHg
FEF 25-75 Post: 4.14 L/sec
FEF 25-75 Pre: 3.85 L/sec
FEF2575-%Change-Post: 7 %
FEF2575-%Pred-Post: 116 %
FEF2575-%Pred-Pre: 108 %
FEV1-%Change-Post: 2 %
FEV1-%Pred-Post: 90 %
FEV1-%Pred-Pre: 88 %
FEV1-Post: 3.75 L
FEV1-Pre: 3.67 L
FEV1FVC-%Change-Post: 0 %
FEV1FVC-%Pred-Pre: 105 %
FEV6-%Change-Post: 2 %
FEV6-%Pred-Post: 89 %
FEV6-%Pred-Pre: 87 %
FEV6-Post: 4.59 L
FEV6-Pre: 4.5 L
FEV6FVC-%Change-Post: 0 %
FEV6FVC-%Pred-Post: 103 %
FEV6FVC-%Pred-Pre: 103 %
FVC-%Change-Post: 2 %
FVC-%Pred-Post: 86 %
FVC-%Pred-Pre: 84 %
FVC-Post: 4.61 L
FVC-Pre: 4.5 L
Post FEV1/FVC ratio: 81 %
Post FEV6/FVC ratio: 100 %
Pre FEV1/FVC ratio: 82 %
Pre FEV6/FVC Ratio: 100 %
RV % pred: 87 %
RV: 1.91 L
TLC % pred: 86 %
TLC: 6.34 L

## 2019-05-17 NOTE — Progress Notes (Signed)
Full PFT performed today. °

## 2019-05-17 NOTE — Assessment & Plan Note (Signed)
Plan: We will order high-resolution CT chest to further evaluate lungs as well as follow 6 mm pulmonary nodule seen on CTA chest in October/2020

## 2019-05-17 NOTE — Assessment & Plan Note (Signed)
Covid diagnosis from October/2020 Patient dealing with long-term sequela from Covid infection Reviewed pulmonary function testing today  Plan: We will obtain high-resolution CT chest

## 2019-05-17 NOTE — Patient Instructions (Addendum)
You were seen today by Coral Ceo, NP  for:   1. Dyspnea on exertion  - CT Chest High Resolution; Future  Continue follow-up with cardiology  Walk today in office  We will proceed forward with high-resolution CT chest to further evaluate ongoing dyspnea especially status post COVID-19 infection  2. COVID-19 virus infection   - CT Chest High Resolution; Future  3.  Pulmonary nodule  We will order high-resolution CT chest to be completed by the end of February/2021 to further  We recommend today:  Orders Placed This Encounter  Procedures  . CT Chest High Resolution    -High-res CT with supine and prone positioning -inspiratory and expiratory cuts.  -Only to be read by Dr. Fredirick Lathe and Dr. Llana Aliment.    Standing Status:   Future    Standing Expiration Date:   07/14/2020    Scheduling Instructions:     Complete by 06/11/19    Order Specific Question:   Preferred imaging location?    Answer:   [facility] [facility]    Order Specific Question:   Radiology Contrast Protocol - do NOT remove file path    Answer:   [email or web address]   Orders Placed This Encounter  Procedures  . CT Chest High Resolution   No orders of the defined types were placed in this encounter.   Follow Up:    Return in about 6 weeks (around 06/28/2019), or if symptoms worsen or fail to improve, for Follow up with Dr. Everardo All, After Chest CT.   Please do your part to reduce the spread of COVID-19:      Reduce your risk of any infection  and COVID19 by using the similar precautions used for avoiding the common cold or flu:  Marland Kitchen Wash your hands often with soap and warm water for at least 20 seconds.  If soap and water are not readily available, use an alcohol-based hand sanitizer with at least 60% alcohol.  . If coughing or sneezing, cover your mouth and nose by coughing or sneezing into the elbow areas of your shirt or coat, into a tissue or into your sleeve (not your  hands). Drinda Butts A MASK when in public  . Avoid shaking hands with others and consider head nods or verbal greetings only. . Avoid touching your eyes, nose, or mouth with unwashed hands.  . Avoid close contact with people who are sick. . Avoid places or events with large numbers of people in one location, like concerts or sporting events. . If you have some symptoms but not all symptoms, continue to monitor at home and seek medical attention if your symptoms worsen. . If you are having a medical emergency, call 911.   ADDITIONAL HEALTHCARE OPTIONS FOR PATIENTS  [facility] / e-Visit: [email or web address]         [facility]: [phone]  [facility]: [phone]                   [facility]: [phone]     It is flu season:   >>> Best ways to protect herself from the flu: Receive the yearly flu vaccine, practice good hand hygiene washing with soap and also using hand sanitizer when available, eat a nutritious meals, get adequate rest, hydrate appropriately   Please contact the office if your symptoms worsen or you have concerns that you are not improving.   Thank you for choosing [facility] for your healthcare,  and for allowing [place] to partner with you on your healthcare journey. I am thankful to be able to provide care to you today.   Wyn Quaker FNP-C

## 2019-05-17 NOTE — Progress Notes (Signed)
@Patient  ID: , male    DOB: [birth year], 54 y.o.   MRN: [id number]  Chief Complaint  Patient presents with  . Follow-up    SOB 3 months    Referring provider: [id number], DO  HPI:  54 year old male former smoker initially consulted with our team on 03/14/2019 for evaluation of dyspnea on exertion  PMH: GERD, obesity, depression, metabolic syndrome, status post COVID-19 infection in October/2020 Smoker/ Smoking History: Former smoker.  Quit smoking 2012. 15 pack year history.  Maintenance: Breo Ellipta 100 Pt of: Dr. 2013  05/17/2019  - Visit   54 year old male former smoker initially consulted with our team on 03/14/2019 for evaluation of dyspnea on exertion.  Patient with smoking history as well status post COVID-19 infection on 01/21/2019.  He continues to have ongoing shortness of breath since that infection.  He had received a course of antibiotics (doxycycline), steroid tapers x3 as well as an albuterol inhaler with no improvement.  He had a [facility]A chest performed at [facility] due to persistent dyspnea on 02/12/2019 which was negative for PE but did demonstrate a 6 mm lung nodule in the right upper lobe.  Patient continues to struggle with activity and dyspnea on exertion.  Prior to COVID-19 infection he was averaging [phone] steps daily.  Currently right now is unable to work due to his symptoms.  Patient presenting to office today after completing pulmonary function testing.  The pulmonary function testing results are listed below:  05/17/2019-pulmonary function test-FVC 4.5 (84% predicted), postbronchodilator ratio 81, postbronchodilator FEV1 3.75 (90% predicted), no bronchodilator response, DLCO 33.78 (109% predicted)  Patient is also being currently evaluated by cardiology.  After his stress test he was found to have some aspect of ischemia he was started on Crestor for this as well as he had his cholesterol levels checked.  His triglycerides were  elevated.  Unfortunately he has been unable to tolerate Crestor.  He is currently holding this for 2 weeks under the recommendations from cardiology due to myalgias.  Questionaires / Pulmonary Flowsheets:   MMRC: mMRC Dyspnea Scale mMRC Score  05/17/2019 1    Tests:   05/14/2019-SARS-CoV-2-negative  01/20/2019-SARS-CoV-2-positive 01/31/2019-chest x-ray-minimal left lingular subsegmental atelectasis or scarring   03/14/2019-echocardiogram-LV ejection fraction 60 to 65%, grade 1 diastolic dysfunction, normal pulmonary artery systolic pressure, global right ventricle is normal systolic function  03/21/2019-long-term monitoring heart monitor-heart rate ranged between 59-142.  Predominant underlying rhythm was sinus rhythm, one run of supraventricular tachycardia occurred lasting 6 beats with a max rate of 138 bpm, no significant arrhythmias, rare ectopy  03/15/19 - Stress Test   There was no ST segment deviation noted during stress.  The left ventricular ejection fraction is mildly decreased (45-54%).  Nuclear stress EF: 54%.  Defect 1: There is a reversible defect of mild severity present in the basal anterior and mid anterior location.  Findings consistent with ischemia.  This is an intermediate risk study.  05/17/2019-pulmonary function test-FVC 4.5 (84% predicted), postbronchodilator ratio 81, postbronchodilator FEV1 3.75 (90% predicted), no bronchodilator response, DLCO 33.78 (109% predicted)  FENO:  No results found for: NITRICOXIDE  PFT: PFT Results Latest Ref Rng & Units 05/17/2019  FVC-Pre L 4.50  FVC-Predicted Pre % 84  FVC-Post L 4.61  FVC-Predicted Post % 86  Pre FEV1/FVC % % 82  Post FEV1/FCV % % 81  FEV1-Pre L 3.67  FEV1-Predicted Pre % 88  FEV1-Post L 3.75  DLCO UNC% % 109  DLCO COR %Predicted % 114  TLC L 6.34  TLC % Predicted % 86  RV % Predicted % 87    WALK:  SIX MIN WALK 05/17/2019 03/21/2019 03/14/2019  Medications - Uloric 40mg , Breo 100-48mcg,  Flonase, Claritin 10mg , metoprolol 25mg , omeprazole 40mg , Crestor 20mg . All were taken around 7am today. -  Supplimental Oxygen during Test? (L/min) No No No  Laps - 12 -  Partial Lap (in Meters) - 6 -  Baseline BP (sitting) - 138/86 -  Baseline Heartrate - 98 -  Baseline Dyspnea (Borg Scale) - 3 -  Baseline Fatigue (Borg Scale) - 1 -  Baseline SPO2 - 95 -  BP (sitting) - 148/90 -  Heartrate - 111 -  Dyspnea (Borg Scale) - 3 -  Fatigue (Borg Scale) - 2 -  SPO2 - 94 -  BP (sitting) - 146/90 -  Heartrate - 105 -  SPO2 - 94 -  Stopped or Paused before Six Minutes - No -  Distance Completed - 414 -  Tech Comments: fast paced walk, no desats, no SOB Patient was able to complete walk at a steady pace. Denied any SOB, chest pain or leg pain during walk. No O2 was needed during or after walk. patient walked moderate pace all three laps, did not feel dizzy, or lightheaded, patient tolerated walk well    Imaging: LONG TERM MONITOR (3-14 DAYS)  Result Date: 04/29/2019 Enrollment period 11/30-12/06/2018 (3 days). Patient had a min HR of 59 bpm (sinus bradycardia), max HR of 142 bpm (sinus tachycardia), and avg HR of 96 bpm (normal sinus rhythm). Predominant underlying rhythm was Sinus Rhythm. 1 run of Supraventricular Tachycardia occurred lasting 6 beats with a max rate of 138 bpm (avg 115 bpm). Isolated SVEs were rare (<1.0%), and no SVE Couplets or SVE Triplets were present. No Isolated VEs, VE Couplets, or VE Triplets were present. No atrial fibrillation. There were no patient triggered events. Impression: 1. No significant arrhythmias. 2. Rare ectopy. T. , MD [facility] [address], [address] [place], [facility] Flora Lipps [phone] 6:53 AM    Lab Results:  CBC    Component Value Date/Time   WBC 5.8 04/26/2019 0846   WBC 7.2 06/20/2014 0828   RBC 5.92 (H) 04/26/2019 0846   RBC 6.25 (A) 06/20/2014 0828   HGB 17.7 04/26/2019 0846   HCT 50.5 04/26/2019 0846     PLT 303 04/26/2019 0846   MCV 85 04/26/2019 0846   MCH 29.9 04/26/2019 0846   MCH 27.0 06/20/2014 0828   MCHC 35.0 04/26/2019 0846   MCHC 30.9 (A) 06/20/2014 0828   RDW 13.0 04/26/2019 0846   LYMPHSABS 1.5 01/07/2019 0830   EOSABS 0.3 01/07/2019 0830   BASOSABS 0.1 01/07/2019 0830    BMET    Component Value Date/Time   NA 139 04/26/2019 0846   K 4.4 04/26/2019 0846   CL 101 04/26/2019 0846   CO2 23 04/26/2019 0846   GLUCOSE 92 04/26/2019 0846   BUN 20 04/26/2019 0846   CREATININE 0.86 04/26/2019 0846   CALCIUM 9.9 04/26/2019 0846   GFRNONAA 100 04/26/2019 0846   GFRAA 116 04/26/2019 0846    BNP No results found for: BNP  ProBNP No results found for: PROBNP  Specialty Problems      Pulmonary Problems   Allergic rhinitis   Pulmonary nodule   Dyspnea on exertion      Allergies  Allergen Reactions  . Depo-Medrol [Methylprednisolone Sodium Succ] Rash    Immunization History  Administered Date(s) Administered  . Influenza,inj,Quad PF,6+ Mos 02/21/2013, 01/16/2014, 01/30/2015, 03/03/2016, 01/27/2017, 02/16/2018, 01/07/2019  . Tdap 11/16/2012  . Zoster Recombinat (Shingrix) 03/22/2018, 06/14/2018    Past Medical History:  Diagnosis Date  . Gout   . Hallux rigidus 10/11/2018   right   . Hyperlipidemia   . Shortness of breath   . Sleep apnea    had sx   . Umbilical hernia     Tobacco History: Social History   Tobacco Use  Smoking Status Former Smoker  . Packs/day: 1.00  . Years: 15.00  . Pack years: 15.00  . Types: Cigarettes  . Quit date: 2012  . Years since quitting: 9.0  Smokeless Tobacco Never Used   Counseling given: Yes   Continue to not smoke  Outpatient Encounter Medications as of 05/17/2019  Medication Sig  . albuterol (VENTOLIN HFA) 108 (90 Base) MCG/ACT inhaler Inhale 2 puffs into the lungs every 6 (six) hours as needed for wheezing or shortness of breath.  . febuxostat (ULORIC) 40 MG tablet Take 1 tablet (40 mg total) by mouth  daily.  . fluticasone (FLONASE) 50 MCG/ACT nasal spray Place 2 sprays into both nostrils daily.  . fluticasone furoate-vilanterol (BREO ELLIPTA) 100-25 MCG/INH AEPB Inhale 1 puff into the lungs daily.  Marland Kitchen loratadine (CLARITIN) 10 MG tablet Take 1 tablet (10 mg total) by mouth daily.  . meloxicam (MOBIC) 15 MG tablet Take 1 tablet (15 mg total) by mouth daily.  . metoprolol tartrate (LOPRESSOR) 25 MG tablet Take 1 tablet (25 mg total) by mouth 2 (two) times daily.  Marland Kitchen omeprazole (PRILOSEC) 40 MG capsule Take 1 capsule (40 mg total) by mouth daily.  . predniSONE (STERAPRED UNI-PAK 21 TAB) 10 MG (21) TBPK tablet As directed x 6 days  . Vitamin D, Ergocalciferol, (DRISDOL) 1.25 MG (50000 UT) CAPS capsule Take 1 capsule (50,000 Units total) by mouth every 7 (seven) days.  . [DISCONTINUED] metoprolol tartrate (LOPRESSOR) 25 MG tablet Take 1 tablet (25 mg total) by mouth 2 (two) times daily.  . rosuvastatin (CRESTOR) 40 MG tablet Take 1 tablet (40 mg total) by mouth daily. (Patient not taking: Reported on 05/17/2019)  . [DISCONTINUED] rosuvastatin (CRESTOR) 20 MG tablet Take 1 tablet (20 mg total) by mouth daily.   No facility-administered encounter medications on file as of 05/17/2019.     Review of Systems  Review of Systems  Constitutional: Positive for fatigue. Negative for activity change, chills, fever and unexpected weight change.  HENT: Negative for postnasal drip, rhinorrhea, sinus pressure, sinus pain and sore throat.   Eyes: Negative.   Respiratory: Positive for shortness of breath. Negative for cough and wheezing.   Cardiovascular: Negative for chest pain and palpitations.  Gastrointestinal: Negative for constipation, diarrhea, nausea and vomiting.  Endocrine: Negative.   Genitourinary: Negative.   Musculoskeletal: Negative.   Skin: Negative.   Neurological: Negative for dizziness and headaches.  Psychiatric/Behavioral: Negative.  Negative for dysphoric mood. The patient is not  nervous/anxious.   All other systems reviewed and are negative.    Physical Exam  BP 136/90 (BP Location: Left Arm, Cuff Size: Normal)   Pulse 98   Temp 97.6 F (36.4 C) (Temporal)   Ht 5' 10.5" (1.791 m)   Wt 246 lb (111.6 kg)   SpO2 93% Comment: RA  BMI 34.80 kg/m   Wt Readings from Last 5 Encounters:  05/17/19 246 lb (111.6 kg)  04/26/19 246 lb 12.8 oz (111.9 kg)  03/15/19 238 lb (108 kg)  03/14/19 237 lb 12.8 oz (107.9 kg)  03/02/19 238 lb (108 kg)    BMI Readings from Last 5 Encounters:  05/17/19 34.80 kg/m  04/26/19 34.91 kg/m  03/15/19 33.19 kg/m  03/14/19 34.12 kg/m  03/02/19 33.67 kg/m     Physical Exam Vitals and nursing note reviewed.  Constitutional:      General: He is not in acute distress.    Appearance: Normal appearance. He is obese.  HENT:     Head: Normocephalic and atraumatic.     Right Ear: Hearing, tympanic membrane, ear canal and external ear normal.     Left Ear: Hearing, tympanic membrane, ear canal and external ear normal.     Nose: Nose normal. No mucosal edema or rhinorrhea.     Right Turbinates: Not enlarged.     Left Turbinates: Not enlarged.     Mouth/Throat:     Mouth: Mucous membranes are dry.     Pharynx: Oropharynx is clear. No oropharyngeal exudate.  Eyes:     Pupils: Pupils are equal, round, and reactive to light.  Cardiovascular:     Rate and Rhythm: Normal rate and regular rhythm.     Pulses: Normal pulses.     Heart sounds: Normal heart sounds. No murmur.  Pulmonary:     Effort: Pulmonary effort is normal.     Breath sounds: Normal breath sounds. No decreased breath sounds, wheezing or rales.  Musculoskeletal:     Cervical back: Normal range of motion.     Right lower leg: No edema.     Left lower leg: No edema.  Lymphadenopathy:     Cervical: No cervical adenopathy.  Skin:    General: Skin is warm and dry.     Capillary Refill: Capillary refill takes less than 2 seconds.     Findings: No erythema or rash.    Neurological:     General: No focal deficit present.     Mental Status: He is alert and oriented to person, place, and time.     Motor: No weakness.     Coordination: Coordination normal.     Gait: Gait is intact. Gait (Tolerated walk in office) normal.  Psychiatric:        Mood and Affect: Mood normal.        Behavior: Behavior normal. Behavior is cooperative.        Thought Content: Thought content normal.        Judgment: Judgment normal.       Assessment & Plan:   COVID-19 virus infection Covid diagnosis from October/2020 Patient dealing with long-term sequela from Covid infection Reviewed pulmonary function testing today  Plan: We will obtain high-resolution CT chest   Dyspnea on exertion Pulmonary function testing stable Patient's ongoing dyspnea on exertion is likely multifactorial directly related to symptoms of long Covid, cardiac etiologies, and sedentary lifestyle  Plan: We will obtain high-resolution CT chest as patient is status post Covid >>>if HRCT stable / normal then need to consider sedentary lifestyles   Recommending that he keep follow-up with cardiology  If he is unable to tolerate Crestor/statins likely cardiology may need to reconsider conservative management and he may benefit from a cath  We will continue Breo Ellipta at this time  Pulmonary nodule Plan: We will order high-resolution CT chest to further evaluate lungs as well as follow 6 mm pulmonary nodule seen on CTA chest in October/2020    Return in about 6 weeks (around 06/28/2019), or if symptoms worsen or fail  to improve, for Follow up with Dr. Everardo AllEllison, After Chest CT.   Coral CeoBrian P Hagan Maltz, NP 05/17/2019   This appointment required 35 minutes of patient care (this includes precharting, chart review, review of results, face-to-face care, etc.).

## 2019-05-17 NOTE — Assessment & Plan Note (Addendum)
Pulmonary function testing stable Patient's ongoing dyspnea on exertion is likely multifactorial directly related to symptoms of long Covid, cardiac etiologies, and sedentary lifestyle  Plan: We will obtain high-resolution CT chest as patient is status post Covid >>>if HRCT stable / normal then need to consider sedentary lifestyles   Recommending that he keep follow-up with cardiology  If he is unable to tolerate Crestor/statins likely cardiology may need to reconsider conservative management and he may benefit from a cath  We will continue Breo Ellipta at this time

## 2019-06-02 ENCOUNTER — Encounter: Payer: Self-pay | Admitting: Family Medicine

## 2019-06-02 ENCOUNTER — Ambulatory Visit: Payer: BC Managed Care – PPO | Admitting: Cardiovascular Disease

## 2019-06-02 MED ORDER — FEBUXOSTAT 40 MG PO TABS: 40.0000 mg | ORAL_TABLET | Freq: Every day | ORAL | 0 refills | Status: DC

## 2019-06-02 MED ORDER — OMEPRAZOLE 40 MG PO CPDR: 40.0000 mg | DELAYED_RELEASE_CAPSULE | Freq: Every day | ORAL | 0 refills | Status: DC

## 2019-06-02 MED ORDER — VITAMIN D (ERGOCALCIFEROL) 1.25 MG (50000 UNIT) PO CAPS: 50000.0000 [IU] | ORAL_CAPSULE | ORAL | 1 refills | Status: DC

## 2019-06-06 DIAGNOSIS — M25522 Pain in left elbow: Secondary | ICD-10-CM | POA: Diagnosis not present

## 2019-06-06 DIAGNOSIS — M19042 Primary osteoarthritis, left hand: Secondary | ICD-10-CM | POA: Diagnosis not present

## 2019-06-06 DIAGNOSIS — M19041 Primary osteoarthritis, right hand: Secondary | ICD-10-CM | POA: Diagnosis not present

## 2019-06-06 DIAGNOSIS — M65832 Other synovitis and tenosynovitis, left forearm: Secondary | ICD-10-CM | POA: Diagnosis not present

## 2019-06-06 DIAGNOSIS — M65831 Other synovitis and tenosynovitis, right forearm: Secondary | ICD-10-CM | POA: Diagnosis not present

## 2019-06-08 ENCOUNTER — Ambulatory Visit (INDEPENDENT_AMBULATORY_CARE_PROVIDER_SITE_OTHER)
Admission: RE | Admit: 2019-06-08 | Discharge: 2019-06-08 | Disposition: A | Discharge status: Home / Self Care | Payer: BC Managed Care – PPO | Source: Ambulatory Visit | Attending: Pulmonary Disease | Admitting: Pulmonary Disease

## 2019-06-08 ENCOUNTER — Other Ambulatory Visit: Payer: Self-pay

## 2019-06-08 DIAGNOSIS — R0600 Dyspnea, unspecified: Secondary | ICD-10-CM | POA: Diagnosis not present

## 2019-06-08 DIAGNOSIS — R911 Solitary pulmonary nodule: Secondary | ICD-10-CM | POA: Diagnosis not present

## 2019-06-08 DIAGNOSIS — U071 COVID-19: Secondary | ICD-10-CM

## 2019-06-08 DIAGNOSIS — R0609 Other forms of dyspnea: Secondary | ICD-10-CM

## 2019-06-09 ENCOUNTER — Encounter: Payer: Self-pay | Admitting: Family Medicine

## 2019-06-13 NOTE — Telephone Encounter (Signed)
Pt is requesting results of 06/08/2019 High Res CT.  Please advise on results.  Thanks!

## 2019-06-14 NOTE — Telephone Encounter (Signed)
[place] Telephone Encounter  Mr. Aeric Burnham is a 54 year old male with prior COVID-19 infection diagnosed on 01/21/19 and HLD who initially presented for dyspnea on exertion since October after his viral infection. Pulmonary evaluation has included PFTs and high resolution CT. PFTs were normal with no evidence of obstructive or restrictive defect. CT Chest on 06/08/19 normal with no evidence of parenchymal disease. Mild bronchitis is present and tiny pulmonary nodules in the upper lobes are present but not clinically significant.   I discussed results with patient as noted above. Overall, he reports improvement in symptoms. He also reports he has had benefit from ICS/LABA inhaler so would recommend continuing this for symptom relief as needed.   Assessment  History of COVID-19 infection Dyspnea on exertion  Pulmonary studies demonstrate no long term effect related to his prior COVID-19 infection. Dyspnea has improved on bronchodilators. Ok to resume regular activity and work from a pulmonary standpoint. Patient can continue to use inhaler for symptom relief.  Mechele Collin, M.D. [facility]/Critical [facility] Medicine 06/14/2019 6:44 PM

## 2019-06-21 ENCOUNTER — Encounter: Payer: Self-pay | Admitting: Family Medicine

## 2019-06-28 ENCOUNTER — Encounter: Payer: Self-pay | Admitting: *Deleted

## 2019-06-28 ENCOUNTER — Other Ambulatory Visit: Payer: Self-pay

## 2019-06-28 ENCOUNTER — Encounter: Payer: Self-pay | Admitting: Pulmonary Disease

## 2019-06-28 ENCOUNTER — Ambulatory Visit: Payer: BC Managed Care – PPO | Admitting: Pulmonary Disease

## 2019-06-28 VITALS — BP 122/78 | HR 75 | Temp 97.1°F | Ht 70.5 in | Wt 241.0 lb

## 2019-06-28 DIAGNOSIS — Z8616 Personal history of COVID-19: Secondary | ICD-10-CM

## 2019-06-28 DIAGNOSIS — R0609 Other forms of dyspnea: Secondary | ICD-10-CM

## 2019-06-28 DIAGNOSIS — E782 Mixed hyperlipidemia: Secondary | ICD-10-CM

## 2019-06-28 DIAGNOSIS — R0600 Dyspnea, unspecified: Secondary | ICD-10-CM

## 2019-06-28 LAB — LDL CHOLESTEROL, DIRECT: LDL Direct: 53 mg/dL (ref 0–99)

## 2019-06-28 LAB — LIPID PANEL
Chol/HDL Ratio: 2.5 ratio (ref 0.0–5.0)
Cholesterol, Total: 104 mg/dL (ref 100–199)
HDL: 42 mg/dL (ref >39)
LDL Chol Calc (NIH): 48 mg/dL (ref 0–99)
Triglycerides: 66 mg/dL (ref 0–149)
VLDL Cholesterol Cal: 14 mg/dL (ref 5–40)

## 2019-06-28 NOTE — Progress Notes (Signed)
Subjective:   PATIENT ID: Scott Rasmussen GENDER: male DOB: 1965/09/23, MRN: [id number]  HPI  Chief Complaint  Patient presents with  . Follow-up    CT in feb, on Breo 100, does not need albuterol   Reason for Visit: Follow-up  Mr. Scott Rasmussen is a 54 year old male with prior COVID-19 infection diagnosed on 01/21/19 and HLD who initially presented for dyspnea on exertion since October after his viral infection.  Reviewed Pulmonary notes in EMR including my telephone note from 06/14/19 and NP Mack from 05/17/19. Pulmonary evaluation has included PFTs and high resolution CT. PFTs were normal with no evidence of obstructive or restrictive defect. CT Chest on 06/08/19 normal with no evidence of parenchymal disease. Mild bronchitis is present and tiny pulmonary nodules in the upper lobes are present but not clinically significant. On phone note, he reported benefit from ICS/LABA.  He continues to be compliant with his inhaler and reports no shortness of breath, wheezing or cough. He has returned to his regular activities and feels he is at baseline. Has not required albuterol inhaler.  Social History: Former smoker 1 pack per day . Quit smoking in 2012. But will intermittently smoke including for one to two months prior to having COVID.  Past Medical History:  Diagnosis Date  . Gout   . Hallux rigidus 10/11/2018   right   . Hyperlipidemia   . Shortness of breath   . Sleep apnea    had sx   . Umbilical hernia      Outpatient Medications Prior to Visit  Medication Sig Dispense Refill  . albuterol (VENTOLIN HFA) 108 (90 Base) MCG/ACT inhaler Inhale 2 puffs into the lungs every 6 (six) hours as needed for wheezing or shortness of breath. 18 g 0  . febuxostat (ULORIC) 40 MG tablet Take 1 tablet (40 mg total) by mouth daily. 90 tablet 0  . fluticasone (FLONASE) 50 MCG/ACT nasal spray Place 2 sprays into both nostrils daily. 48 g 3  . fluticasone furoate-vilanterol (BREO ELLIPTA)  100-25 MCG/INH AEPB Inhale 1 puff into the lungs daily. 1 each 9  . loratadine (CLARITIN) 10 MG tablet Take 1 tablet (10 mg total) by mouth daily. 90 tablet 3  . meloxicam (MOBIC) 15 MG tablet Take 1 tablet (15 mg total) by mouth daily. 30 tablet 0  . omeprazole (PRILOSEC) 40 MG capsule Take 1 capsule (40 mg total) by mouth daily. 90 capsule 0  . predniSONE (STERAPRED UNI-PAK 21 TAB) 10 MG (21) TBPK tablet As directed x 6 days 21 tablet 0  . rosuvastatin (CRESTOR) 40 MG tablet Take 1 tablet (40 mg total) by mouth daily. 90 tablet 3  . Vitamin D, Ergocalciferol, (DRISDOL) 1.25 MG (50000 UNIT) CAPS capsule Take 1 capsule (50,000 Units total) by mouth every 7 (seven) days. 12 capsule 1  . metoprolol tartrate (LOPRESSOR) 25 MG tablet Take 1 tablet (25 mg total) by mouth 2 (two) times daily. 60 tablet 0   No facility-administered medications prior to visit.    Review of Systems  Constitutional: Negative for chills, diaphoresis, fever, malaise/fatigue and weight loss.  HENT: Negative for congestion.   Respiratory: Negative for cough, hemoptysis, sputum production, shortness of breath and wheezing.   Cardiovascular: Negative for chest pain, palpitations and leg swelling.     Objective:   Vitals:   06/28/19 0908  BP: 122/78  Pulse: 75  Temp: (!) 97.1 F (36.2 C)  TempSrc: Temporal  SpO2: 99%  Weight: 241  lb (109.3 kg)  Height: 5' 10.5" (1.791 m)   SpO2: 99 % O2 Device: None (Room air)  Physical Exam: General: Well-appearing, no acute distress HENT: [place], AT Eyes: EOMI, no scleral icterus Respiratory: Clear to auscultation bilaterally.  No crackles, wheezing or rales Cardiovascular: RRR, -M/R/G, no JVD Extremities:-Edema,-tenderness Neuro: AAO x4, CNII-XII grossly intact Skin: Intact, no rashes or bruising Psych: Normal mood, normal affect  Data Reviewed:  Imaging: [facility] 02/12/2019 from [facility] (report only) IMPRESSION:  1. No evidence of acute pulmonary emboli. 2. 6  mm RIGHT upper lobe pulmonary nodule. Please see recommendations below. 3. 2.7 x 1.7 x 2.3 cm enhancing RIGHT hepatic lobe lesion is indeterminate. Consider follow-up multiphase hepatic CT or MRI. 4. Nonobstructing LEFT renal calculi.  CT Chest on 06/08/19 normal with no evidence of parenchymal disease. Mild bronchitis is present and tiny pulmonary nodules in the upper lobes are present but not clinically significant.   PFT: 05/17/19 FVC 4.61 (86%) FEV1 3.75 (90%) Ratio 82  TLC 86% DLCO 109% Interpretation: Normal PFTs. No evidence of obstructive or restrictive defect.     Assessment & Plan:   Discussion: 54 year old male with prior COVID-19 infection diagnosed on 01/21/19 who presents for follow-up. Cardiac work-up negative. PFTs and HRCT with no abnormal findings suggestive of sequelae related to COVID-19. Clinically improved and physical strength at baseline.  Dyspnea on exertion secondary to viral infection and deconditioning- resolved --CONTINUE Breo 100 -25 mcg ONE PUFF ONCE A DAY. --You can trial one week off the inhaler and monitor your symptoms. If no shortness of breath, wheezing or cough, you can stop the inhaler. --Continue regular aerobic exercise to build up your strength and stamina  From a pulmonary standpoint, you are cleared to return to work with no restrictions.  Health Maintenance Immunization History  Administered Date(s) Administered  . Influenza,inj,Quad PF,6+ Mos 02/21/2013, 01/16/2014, 01/30/2015, 03/03/2016, 01/27/2017, 02/16/2018, 01/07/2019  . Tdap 11/16/2012  . Zoster Recombinat (Shingrix) 03/22/2018, 06/14/2018    No orders of the defined types were placed in this encounter. No orders of the defined types were placed in this encounter.   Return if symptoms worsen or fail to improve.  I have spent a total time of 31-minutes on the day of the appointment reviewing prior documentation, coordinating care and discussing medical diagnosis and plan with the  patient/family. Imaging, labs and tests included in this note have been reviewed and interpreted independently by me.  Kynzi Levay Mechele Collin, MD [facility] 06/28/2019 9:18 AM  Office Number [phone]

## 2019-06-28 NOTE — Patient Instructions (Signed)
54 year old male with prior COVID-19 infection diagnosed on 01/21/19 who presents for follow-up. Cardiac work-up negative. PFTs and HRCT with no abnormal findings suggestive of sequelae related to COVID-19. Clinically improved and physical strength at baseline.  Dyspnea on exertion secondary to viral infection and deconditioning- resolved --CONTINUE Breo 100 -25 mcg ONE PUFF ONCE A DAY. --You can trial one week off the inhaler and monitor your symptoms. If no shortness of breath, wheezing or cough, you can stop the inhaler. --Continue regular aerobic exercise to build up your strength and stamina  From a pulmonary standpoint, you are cleared to return to work with no restrictions.

## 2019-07-03 NOTE — Progress Notes (Signed)
Virtual Visit via Telephone Note   This visit type was conducted due to national recommendations for restrictions regarding the COVID-19 Pandemic (e.g. social distancing) in an effort to limit this patient's exposure and mitigate transmission in our community.  Due to his co-morbid illnesses, this patient is at least at moderate risk for complications without adequate follow up.  This format is felt to be most appropriate for this patient at this time.  The patient did not have access to video technology/had technical difficulties with video requiring transitioning to audio format only (telephone).  All issues noted in this document were discussed and addressed.  No physical exam could be performed with this format.  Please refer to the patient's chart for his  consent to telehealth for [facility].   The patient was identified using 2 identifiers.  Date:  07/05/2019   ID:  Scott Rasmussen, DOB [birth year], MRN [id number]  Patient Location: Home Provider Location: Home  PCP:  [name], DO  Cardiologist:  Reatha Harps, MD  Evaluation Performed:  Follow-Up Visit  Chief Complaint:  HLD  History of Present Illness:    Scott Rasmussen is a 54 y.o. male with CAD, HLD, Covid who presents for follow-up of CAD. He reports he has been having arthritis in his elbows. He reports it to be a dull achy pain that is constant. He reports that when he stopped crestor for 1 month it got better. He reports improvement in the pain with 10 mg crestor. Reports he is playing golf 4-5 days per week. Reports his SOB has improved. He reports no CP/pressure. Reports he is happy about his overall improvement in health. Plans to return to work. Cleared by pulmonary to return to work. Denies CP, SOB, palpitations today.   Problem List 1. Covid-19 PNA 2. CAD (mild ischemia in basal to mid anterior wall, intermediate risk, NM SPECT 03/15/2019) -CAC score 0 2015 3. HLD  -T chol 104, HDL 42, LDL 48, TG  66 4. Former tobacco abuse  The patient does not have symptoms concerning for COVID-19 infection (fever, chills, cough, or new shortness of breath).    Past Medical History:  Diagnosis Date  . Gout   . Hallux rigidus 10/11/2018   right   . Hyperlipidemia   . Shortness of breath   . Sleep apnea    had sx   . Umbilical hernia    Past Surgical History:  Procedure Laterality Date  . TONSILLECTOMY AND ADENOIDECTOMY  2008  . UVULOPALATOPHARYNGOPLASTY  2008     Current Meds  Medication Sig  . albuterol (VENTOLIN HFA) 108 (90 Base) MCG/ACT inhaler Inhale 2 puffs into the lungs every 6 (six) hours as needed for wheezing or shortness of breath.  . febuxostat (ULORIC) 40 MG tablet Take 1 tablet (40 mg total) by mouth daily.  . fluticasone (FLONASE) 50 MCG/ACT nasal spray Place 2 sprays into both nostrils daily. (Patient taking differently: Place 2 sprays into both nostrils as needed. )  . icosapent Ethyl (VASCEPA) 1 g capsule Take 2 g by mouth 2 (two) times daily.  Marland Kitchen loratadine (CLARITIN) 10 MG tablet Take 1 tablet (10 mg total) by mouth daily. (Patient taking differently: Take 10 mg by mouth daily as needed. )  . metoprolol tartrate (LOPRESSOR) 25 MG tablet Take 1 tablet (25 mg total) by mouth 2 (two) times daily. (Patient taking differently: Take 25 mg by mouth daily. )  . omeprazole (PRILOSEC) 40 MG capsule Take 1 capsule (  40 mg total) by mouth daily.  . Vitamin D, Ergocalciferol, (DRISDOL) 1.25 MG (50000 UNIT) CAPS capsule Take 1 capsule (50,000 Units total) by mouth every 7 (seven) days.  . [DISCONTINUED] rosuvastatin (CRESTOR) 40 MG tablet Take 1 tablet (40 mg total) by mouth daily. (Patient taking differently: Take 10 mg by mouth daily. )     Allergies:   Depo-medrol [methylprednisolone sodium succ]   Social History   Tobacco Use  . Smoking status: Former Smoker    Packs/day: 1.00    Years: 15.00    Pack years: 15.00    Types: Cigarettes    Quit date: 2012    Years since  quitting: 9.2  . Smokeless tobacco: Never Used  Substance Use Topics  . Alcohol use: Yes    Comment: 2 drinks per month per pt.  . Drug use: Not Currently    Types: Marijuana     Family Hx: The patient's family history includes Healthy in his mother; Heart attack in his father. There is no history of Colon cancer, Esophageal cancer, Rectal cancer, or Stomach cancer.  ROS:   Please see the history of present illness.     All other systems reviewed and are negative.   Prior CV studies:   The following studies were reviewed today:  NM Stress 03/15/2019  There was no ST segment deviation noted during stress.  The left ventricular ejection fraction is mildly decreased (45-54%).  Nuclear stress EF: 54%.  Defect 1: There is a reversible defect of mild severity present in the basal anterior and mid anterior location.  Findings consistent with ischemia.  This is an intermediate risk study.   Basal to mid anterior ischemia.  Given normal perfusion at apex, suspect diagonal branch ischemia  TTE 03/14/2019 1. Left ventricular ejection fraction, by visual estimation, is 60 to  65%. The left ventricle has normal function. Left ventricular septal wall  thickness was moderately increased. There is moderately increased left  ventricular hypertrophy.  2. Left ventricular diastolic parameters are consistent with Grade I  diastolic dysfunction (impaired relaxation).  3. Global right ventricle has normal systolic function.The right  ventricular size is normal. No increase in right ventricular wall  thickness.  4. Left atrial size was normal.  5. Right atrial size was normal.  6. The mitral valve is normal in structure. No evidence of mitral valve  regurgitation. No evidence of mitral stenosis.  7. The tricuspid valve is normal in structure. Tricuspid valve  regurgitation is mild.  8. The aortic valve is tricuspid. Aortic valve regurgitation is not  visualized. No evidence of  aortic valve sclerosis or stenosis.  9. The pulmonic valve was normal in structure. Pulmonic valve  regurgitation is not visualized.  10. Normal pulmonary artery systolic pressure.  11. The inferior vena cava is normal in size with greater than 50%   Labs/Other Tests and Data Reviewed:    EKG:  No ECG reviewed.  Recent Labs: 01/07/2019: ALT 26; TSH 1.350 04/26/2019: BUN 20; Creatinine, Ser 0.86; Hemoglobin 17.7; Platelets 303; Potassium 4.4; Sodium 139   Recent Lipid Panel Lab Results  Component Value Date/Time   CHOL 104 06/28/2019 08:37 AM   TRIG 66 06/28/2019 08:37 AM   TRIG 242 (H) 10/17/2016 08:06 AM   HDL 42 06/28/2019 08:37 AM   HDL 39 (L) 10/17/2016 08:06 AM   CHOLHDL 2.5 06/28/2019 08:37 AM   LDLCALC 48 06/28/2019 08:37 AM   LDLCALC 86 11/28/2013 10:24 AM   LDLDIRECT 53 06/28/2019 11:39  AM    Wt Readings from Last 3 Encounters:  07/05/19 235 lb (106.6 kg)  06/28/19 241 lb (109.3 kg)  05/17/19 246 lb (111.6 kg)    Objective:    Vital Signs:  BP 131/87   Pulse 94   Ht 5' 10.5" (1.791 m)   Wt 235 lb (106.6 kg)   BMI 33.24 kg/m    VITAL SIGNS:  reviewed  Gen: NAD Pulm: Talking complete sentences Psych: normal mood/affect   ASSESSMENT & PLAN:    1. Coronary artery disease involving native coronary artery of native heart without angina pectoris -intermediate risk stress with ischemia in diagonal distribution.  -no CP or SOB reported -will continue medical management with metoprolol and aggressive lipid lowering therapy  -continue ASA 81 mg QD   2. Mixed hyperlipidemia 3. Pain of both elbows -had issues with 40 mg crestor which includes elbow pain. Down to 10 mg crestor and still having achy pains. Did stop for 1 month and noticed improvement. Will drop to 5 mg crestor once daily and continue vascepa 2 g BID. If still having symptoms will drop to 5 mg every other day. Will consider lipitor if not able to tolerate crestor.    COVID-19 Education: The signs  and symptoms of COVID-19 were discussed with the patient and how to seek care for testing (follow up with PCP or arrange E-visit).  The importance of social distancing was discussed today.  Time:   Today, I have spent 35 minutes with the patient with telehealth technology discussing the above problems.     Medication Adjustments/Labs and Tests Ordered: Current medicines are reviewed at length with the patient today.  Concerns regarding medicines are outlined above.   Tests Ordered: No orders of the defined types were placed in this encounter.  Medication Changes: Meds ordered this encounter  Medications  . rosuvastatin (CRESTOR) 10 MG tablet    Sig: Take 0.5 tablets (5 mg total) by mouth daily.    Dispense:  60 tablet    Refill:  1    Follow Up:  In Person in 6 month(s)  Signed, Reatha Harps, MD  07/05/2019 8:43 AM    [facility]

## 2019-07-05 ENCOUNTER — Telehealth (INDEPENDENT_AMBULATORY_CARE_PROVIDER_SITE_OTHER): Payer: BC Managed Care – PPO | Admitting: Cardiovascular Disease

## 2019-07-05 ENCOUNTER — Encounter: Payer: Self-pay | Admitting: Cardiovascular Disease

## 2019-07-05 VITALS — BP 131/87 | HR 94 | Ht 70.5 in | Wt 235.0 lb

## 2019-07-05 DIAGNOSIS — M25522 Pain in left elbow: Secondary | ICD-10-CM

## 2019-07-05 DIAGNOSIS — E782 Mixed hyperlipidemia: Secondary | ICD-10-CM

## 2019-07-05 DIAGNOSIS — I2510 Atherosclerotic heart disease of native coronary artery without angina pectoris: Secondary | ICD-10-CM | POA: Diagnosis not present

## 2019-07-05 DIAGNOSIS — M25521 Pain in right elbow: Secondary | ICD-10-CM

## 2019-07-05 MED ORDER — ROSUVASTATIN CALCIUM 10 MG PO TABS: 5.0000 mg | ORAL_TABLET | Freq: Every day | ORAL | 1 refills | Status: DC

## 2019-07-05 NOTE — Patient Instructions (Signed)
Medication Instructions:  We will send in for 10 mg Crestor- take 0.5 tablet (5mg ) daily. *If you need a refill on your cardiac medications before your next appointment, please call your pharmacy*    Follow-Up: At [facility], you and your health needs are our priority.  As part of our continuing mission to provide you with exceptional heart care, we have created designated Provider Care Teams.  These Care Teams include your primary Cardiologist (physician) and Advanced Practice Providers (APPs -  Physician Assistants and Nurse Practitioners) who all work together to provide you with the care you need, when you need it.  We recommend signing up for the patient portal called "MyChart".  Sign up information is provided on this After Visit Summary.  MyChart is used to connect with patients for Virtual Visits (Telemedicine).  Patients are able to view lab/test results, encounter notes, upcoming appointments, etc.  Non-urgent messages can be sent to your provider as well.   To learn more about what you can do with MyChart, go to [facility].    Your next appointment:   6 month(s)  The format for your next appointment:   In Person  Provider:   [email or web address], MD

## 2019-07-14 ENCOUNTER — Ambulatory Visit: Payer: BC Managed Care – PPO | Attending: Internal Medicine

## 2019-07-14 DIAGNOSIS — Z23 Encounter for immunization: Secondary | ICD-10-CM

## 2019-07-14 NOTE — Progress Notes (Signed)
   Covid-19 Vaccination Clinic  Name:  JACQUES FIFE    MRN: [id number] DOB: 12-Feb-1966  07/14/2019  Mr. Hall was observed post Covid-19 immunization for 15 minutes without incident. He was provided with Vaccine Information Sheet and instruction to access the V-Safe system.   Mr. Jacobsen was instructed to call 911 with any severe reactions post vaccine: Marland Kitchen Difficulty breathing  . Swelling of face and throat  . A fast heartbeat  . A bad rash all over body  . Dizziness and weakness   Immunizations Administered    Name Date Dose VIS Date Route   Pfizer COVID-19 Vaccine 07/14/2019  8:25 AM 0.3 mL 04/01/2019 Intramuscular   Manufacturer: [employer], [employer]   Lot: [id number]   NDC: [address]-6

## 2019-08-08 ENCOUNTER — Ambulatory Visit: Payer: BC Managed Care – PPO | Attending: Internal Medicine

## 2019-08-08 DIAGNOSIS — Z23 Encounter for immunization: Secondary | ICD-10-CM

## 2019-08-08 NOTE — Progress Notes (Signed)
   Covid-19 Vaccination Clinic  Name:  HILLARD GOODWINE    MRN: [id number] DOB: [birth year]  08/08/2019  Mr. Wiltsey was observed post Covid-19 immunization for 15 minutes without incident. He was provided with Vaccine Information Sheet and instruction to access the V-Safe system.   Mr. Dibello was instructed to call 911 with any severe reactions post vaccine: Marland Kitchen Difficulty breathing  . Swelling of face and throat  . A fast heartbeat  . A bad rash all over body  . Dizziness and weakness   Immunizations Administered    Name Date Dose VIS Date Route   Pfizer COVID-19 Vaccine 08/08/2019  8:37 AM 0.3 mL 06/15/2018 Intramuscular   Manufacturer: [employer], [employer]   Lot: [id number]   NDC: [address]-5

## 2019-09-01 ENCOUNTER — Ambulatory Visit: Payer: BC Managed Care – PPO | Admitting: Podiatry

## 2019-09-01 ENCOUNTER — Other Ambulatory Visit: Payer: Self-pay

## 2019-09-01 VITALS — Temp 97.3°F

## 2019-09-01 DIAGNOSIS — M722 Plantar fascial fibromatosis: Secondary | ICD-10-CM | POA: Diagnosis not present

## 2019-09-01 MED ORDER — MELOXICAM 15 MG PO TABS: 15.0000 mg | ORAL_TABLET | Freq: Every day | ORAL | 1 refills | Status: DC

## 2019-09-01 NOTE — Patient Instructions (Signed)
For instructions on how to put on your Night Splint, please visit [email or web address]   Plantar Fasciitis (Heel Spur Syndrome) with Rehab The plantar fascia is a fibrous, ligament-like, soft-tissue structure that spans the bottom of the foot. Plantar fasciitis is a condition that causes pain in the foot due to inflammation of the tissue. SYMPTOMS   Pain and tenderness on the underneath side of the foot.  Pain that worsens with standing or walking. CAUSES  Plantar fasciitis is caused by irritation and injury to the plantar fascia on the underneath side of the foot. Common mechanisms of injury include:  Direct trauma to bottom of the foot.  Damage to a small nerve that runs under the foot where the main fascia attaches to the heel bone.  Stress placed on the plantar fascia due to bone spurs. RISK INCREASES WITH:   Activities that place stress on the plantar fascia (running, jumping, pivoting, or cutting).  Poor strength and flexibility.  Improperly fitted shoes.  Tight calf muscles.  Flat feet.  Failure to warm-up properly before activity.  Obesity. PREVENTION  Warm up and stretch properly before activity.  Allow for adequate recovery between workouts.  Maintain physical fitness:  Strength, flexibility, and endurance.  Cardiovascular fitness.  Maintain a health body weight.  Avoid stress on the plantar fascia.  Wear properly fitted shoes, including arch supports for individuals who have flat feet.  PROGNOSIS  If treated properly, then the symptoms of plantar fasciitis usually resolve without surgery. However, occasionally surgery is necessary.  RELATED COMPLICATIONS   Recurrent symptoms that may result in a chronic condition.  Problems of the lower back that are caused by compensating for the injury, such as limping.  Pain or weakness of the foot during push-off following surgery.  Chronic inflammation, scarring, and partial or complete fascia tear,  occurring more often from repeated injections.  TREATMENT  Treatment initially involves the use of ice and medication to help reduce pain and inflammation. The use of strengthening and stretching exercises may help reduce pain with activity, especially stretches of the Achilles tendon. These exercises may be performed at home or with a therapist. Your caregiver may recommend that you use heel cups of arch supports to help reduce stress on the plantar fascia. Occasionally, corticosteroid injections are given to reduce inflammation. If symptoms persist for greater than 6 months despite non-surgical (conservative), then surgery may be recommended.   MEDICATION   If pain medication is necessary, then nonsteroidal anti-inflammatory medications, such as aspirin and ibuprofen, or other minor pain relievers, such as acetaminophen, are often recommended.  Do not take pain medication within 7 days before surgery.  Prescription pain relievers may be given if deemed necessary by your caregiver. Use only as directed and only as much as you need.  Corticosteroid injections may be given by your caregiver. These injections should be reserved for the most serious cases, because they may only be given a certain number of times.  HEAT AND COLD  Cold treatment (icing) relieves pain and reduces inflammation. Cold treatment should be applied for 10 to 15 minutes every 2 to 3 hours for inflammation and pain and immediately after any activity that aggravates your symptoms. Use ice packs or massage the area with a piece of ice (ice massage).  Heat treatment may be used prior to performing the stretching and strengthening activities prescribed by your caregiver, physical therapist, or athletic trainer. Use a heat pack or soak the injury in warm water.  SEEK IMMEDIATE MEDICAL CARE   IF:  Treatment seems to offer no benefit, or the condition worsens.  Any medications produce adverse side effects.  EXERCISES- RANGE OF  MOTION (ROM) AND STRETCHING EXERCISES - Plantar Fasciitis (Heel Spur Syndrome) These exercises may help you when beginning to rehabilitate your injury. Your symptoms may resolve with or without further involvement from your physician, physical therapist or athletic trainer. While completing these exercises, remember:   Restoring tissue flexibility helps normal motion to return to the joints. This allows healthier, less painful movement and activity.  An effective stretch should be held for at least 30 seconds.  A stretch should never be painful. You should only feel a gentle lengthening or release in the stretched tissue.  RANGE OF MOTION - Toe Extension, Flexion  Sit with your right / left leg crossed over your opposite knee.  Grasp your toes and gently pull them back toward the top of your foot. You should feel a stretch on the bottom of your toes and/or foot.  Hold this stretch for 10 seconds.  Now, gently pull your toes toward the bottom of your foot. You should feel a stretch on the top of your toes and or foot.  Hold this stretch for 10 seconds. Repeat  times. Complete this stretch 3 times per day.   RANGE OF MOTION - Ankle Dorsiflexion, Active Assisted  Remove shoes and sit on a chair that is preferably not on a carpeted surface.  Place right / left foot under knee. Extend your opposite leg for support.  Keeping your heel down, slide your right / left foot back toward the chair until you feel a stretch at your ankle or calf. If you do not feel a stretch, slide your bottom forward to the edge of the chair, while still keeping your heel down.  Hold this stretch for 10 seconds. Repeat 3 times. Complete this stretch 2 times per day.   STRETCH  Gastroc, Standing  Place hands on wall.  Extend right / left leg, keeping the front knee somewhat bent.  Slightly point your toes inward on your back foot.  Keeping your right / left heel on the floor and your knee straight, shift  your weight toward the wall, not allowing your back to arch.  You should feel a gentle stretch in the right / left calf. Hold this position for 10 seconds. Repeat 3 times. Complete this stretch 2 times per day.  STRETCH  Soleus, Standing  Place hands on wall.  Extend right / left leg, keeping the other knee somewhat bent.  Slightly point your toes inward on your back foot.  Keep your right / left heel on the floor, bend your back knee, and slightly shift your weight over the back leg so that you feel a gentle stretch deep in your back calf.  Hold this position for 10 seconds. Repeat 3 times. Complete this stretch 2 times per day.  STRETCH  Gastrocsoleus, Standing  Note: This exercise can place a lot of stress on your foot and ankle. Please complete this exercise only if specifically instructed by your caregiver.   Place the ball of your right / left foot on a step, keeping your other foot firmly on the same step.  Hold on to the wall or a rail for balance.  Slowly lift your other foot, allowing your body weight to press your heel down over the edge of the step.  You should feel a stretch in your right / left calf.  Hold this position   for 10 seconds.  Repeat this exercise with a slight bend in your right / left knee. Repeat 3 times. Complete this stretch 2 times per day.   STRENGTHENING EXERCISES - Plantar Fasciitis (Heel Spur Syndrome)  These exercises may help you when beginning to rehabilitate your injury. They may resolve your symptoms with or without further involvement from your physician, physical therapist or athletic trainer. While completing these exercises, remember:   Muscles can gain both the endurance and the strength needed for everyday activities through controlled exercises.  Complete these exercises as instructed by your physician, physical therapist or athletic trainer. Progress the resistance and repetitions only as guided.  STRENGTH - Towel Curls  Sit in  a chair positioned on a non-carpeted surface.  Place your foot on a towel, keeping your heel on the floor.  Pull the towel toward your heel by only curling your toes. Keep your heel on the floor. Repeat 3 times. Complete this exercise 2 times per day.  STRENGTH - Ankle Inversion  Secure one end of a rubber exercise band/tubing to a fixed object (table, pole). Loop the other end around your foot just before your toes.  Place your fists between your knees. This will focus your strengthening at your ankle.  Slowly, pull your big toe up and in, making sure the band/tubing is positioned to resist the entire motion.  Hold this position for 10 seconds.  Have your muscles resist the band/tubing as it slowly pulls your foot back to the starting position. Repeat 3 times. Complete this exercises 2 times per day.  Document Released: 04/07/2005 Document Revised: 06/30/2011 Document Reviewed: 07/20/2008 Exit[employer] Patient Information 2014 [employer].  

## 2019-09-06 DIAGNOSIS — M722 Plantar fascial fibromatosis: Secondary | ICD-10-CM | POA: Insufficient documentation

## 2019-09-06 NOTE — Progress Notes (Signed)
Subjective: 54 year old male presents the office today for follow evaluation of heel pain, plantar fasciitis.  He had injections performed last appointment and he states they helped for about a month.  Majority of his pain is in the morning he first gets up and gets better with activity.  He stopped wearing the plantar fascial braces because he states he forgot about them.  He states his pain is currently 2/10 describes more of a soreness. Denies any systemic complaints such as fevers, chills, nausea, vomiting. No acute changes since last appointment, and no other complaints at this time.   Objective: AAO x3, NAD DP/PT pulses palpable bilaterally, CRT less than 3 seconds There is mild tenderness palpation on plantar medial tubercle of the calcaneus at the insertion of plantar fascial bilaterally.  There is no pain with lateral compression of calcaneus.  There is no pain on the Achilles tendon.  No other discomfort.  No significant edema or erythema. No open lesions or pre-ulcerative lesions.  No pain with calf compression, swelling, warmth, erythema  Assessment: 54 year old male with bilateral plantar fasciitis  Plan: -All treatment options discussed with the patient including all alternatives, risks, complications.  -Overall has minimal discomfort today.  He was offered steroid injections.  I wanted to go back using plantar fascial brace as well as supportive shoes.  Continue stretching and icing daily.  Dispensed night splint. -Prescribed mobic. Discussed side effects of the medication and directed to stop if any are to occur and call the office.  -Patient encouraged to call the office with any questions, concerns, change in symptoms.   Vivi Barrack DPM

## 2019-09-12 ENCOUNTER — Telehealth: Payer: Self-pay | Admitting: Pulmonary Disease

## 2019-09-12 NOTE — Telephone Encounter (Signed)
Gave forms to Tammy Parrett,NP for completion -pr

## 2019-09-12 NOTE — Telephone Encounter (Signed)
Will route back to [place] since Arlys John will not sign forms.

## 2019-09-13 NOTE — Telephone Encounter (Signed)
Rec'd completed paperwork - fwd to [employer] via interoffice mail -pr  

## 2019-09-15 ENCOUNTER — Ambulatory Visit (INDEPENDENT_AMBULATORY_CARE_PROVIDER_SITE_OTHER): Payer: BC Managed Care – PPO | Admitting: Family Medicine

## 2019-09-15 ENCOUNTER — Other Ambulatory Visit: Payer: Self-pay

## 2019-09-15 ENCOUNTER — Encounter: Payer: Self-pay | Admitting: Family Medicine

## 2019-09-15 VITALS — BP 109/71 | HR 99 | Temp 97.8°F | Ht 70.5 in | Wt 227.2 lb

## 2019-09-15 DIAGNOSIS — E7800 Pure hypercholesterolemia, unspecified: Secondary | ICD-10-CM

## 2019-09-15 DIAGNOSIS — Z0001 Encounter for general adult medical examination with abnormal findings: Secondary | ICD-10-CM

## 2019-09-15 DIAGNOSIS — Z0000 Encounter for general adult medical examination without abnormal findings: Secondary | ICD-10-CM

## 2019-09-15 DIAGNOSIS — Z720 Tobacco use: Secondary | ICD-10-CM

## 2019-09-15 DIAGNOSIS — Z125 Encounter for screening for malignant neoplasm of prostate: Secondary | ICD-10-CM | POA: Diagnosis not present

## 2019-09-15 DIAGNOSIS — I700 Atherosclerosis of aorta: Secondary | ICD-10-CM | POA: Diagnosis not present

## 2019-09-15 MED ORDER — OMEPRAZOLE 40 MG PO CPDR: 40.0000 mg | DELAYED_RELEASE_CAPSULE | Freq: Every day | ORAL | 3 refills | Status: DC

## 2019-09-15 MED ORDER — FEBUXOSTAT 40 MG PO TABS: 40.0000 mg | ORAL_TABLET | Freq: Every day | ORAL | 3 refills | Status: DC

## 2019-09-15 MED ORDER — ICOSAPENT ETHYL 1 G PO CAPS: 2.0000 g | ORAL_CAPSULE | Freq: Two times a day (BID) | ORAL | 3 refills | Status: DC

## 2019-09-15 MED ORDER — VARENICLINE TARTRATE 1 MG PO TABS: 1.0000 mg | ORAL_TABLET | Freq: Two times a day (BID) | ORAL | 2 refills | Status: DC

## 2019-09-15 MED ORDER — VITAMIN D (ERGOCALCIFEROL) 1.25 MG (50000 UNIT) PO CAPS: 50000.0000 [IU] | ORAL_CAPSULE | ORAL | 3 refills | Status: DC

## 2019-09-15 MED ORDER — CHANTIX STARTING MONTH PAK 0.5 MG X 11 & 1 MG X 42 PO TABS: ORAL_TABLET | ORAL | 0 refills | Status: DC

## 2019-09-15 NOTE — Progress Notes (Signed)
Scott Rasmussen is a 54 y.o. male presents to office today for annual physical exam examination.    Concerns today include: 1.  Tobacco use Patient reports that he had abstained from tobacco for over 8 years before resuming again after he had Covid last year.  He is currently smoking 1 pack/day and has been doing so for the last couple of months.  He is interested in going on Chantix to stop smoking as he is motivated to get off of cigarettes.  Denies any hemoptysis, difficulty swallowing, shortness of breath or unplanned weight loss.  Marital status: Married, Substance use: Cigarettes Diet: Fair Last eye exam: Up-to-date Last dental exam: Up-to-date Last colonoscopy: Up-to-date Refills needed today: Vitamin D, Uloric, Vascepa and Prilosec Immunizations needed: none   Past Medical History:  Diagnosis Date  . Gout   . Hallux rigidus 10/11/2018   right   . Hyperlipidemia   . Shortness of breath   . Sleep apnea    had sx   . Umbilical hernia    Social History   Socioeconomic History  . Marital status: Married    Spouse name: Not on file  . Number of children: Not on file  . Years of education: Not on file  . Highest education level: Not on file  Occupational History  . Not on file  Tobacco Use  . Smoking status: Former Smoker    Packs/day: 1.00    Years: 15.00    Pack years: 15.00    Types: Cigarettes    Quit date: 2012    Years since quitting: 9.4  . Smokeless tobacco: Never Used  Substance and Sexual Activity  . Alcohol use: Yes    Comment: 2 drinks per month per pt.  . Drug use: Not Currently    Types: Marijuana  . Sexual activity: Not on file  Other Topics Concern  . Not on file  Social History Narrative   Works at [employer] Strain:   . Difficulty of Paying Living Expenses:   Food Insecurity:   . Worried About Charity fundraiser in the Last Year:   . Arboriculturist in the Last Year:    Transportation Needs:   . Film/video editor (Medical):   Marland Kitchen Lack of Transportation (Non-Medical):   Physical Activity:   . Days of Exercise per Week:   . Minutes of Exercise per Session:   Stress:   . Feeling of Stress :   Social Connections:   . Frequency of Communication with Friends and Family:   . Frequency of Social Gatherings with Friends and Family:   . Attends Religious Services:   . Active Member of Clubs or Organizations:   . Attends Archivist Meetings:   Marland Kitchen Marital Status:   Intimate Partner Violence:   . Fear of Current or Ex-Partner:   . Emotionally Abused:   Marland Kitchen Physically Abused:   . Sexually Abused:    Past Surgical History:  Procedure Laterality Date  . TONSILLECTOMY AND ADENOIDECTOMY  2008  . UVULOPALATOPHARYNGOPLASTY  2008   Family History  Problem Relation Age of Onset  . Healthy Mother   . Heart attack Father   . Colon cancer Neg Hx   . Esophageal cancer Neg Hx   . Rectal cancer Neg Hx   . Stomach cancer Neg Hx     Current Outpatient Medications:  .  febuxostat (ULORIC) 40 MG tablet,  Take 1 tablet (40 mg total) by mouth daily., Disp: 90 tablet, Rfl: 0 .  icosapent Ethyl (VASCEPA) 1 g capsule, Take 2 g by mouth 2 (two) times daily., Disp: , Rfl:  .  meloxicam (MOBIC) 15 MG tablet, Take 1 tablet (15 mg total) by mouth daily., Disp: 30 tablet, Rfl: 1 .  omeprazole (PRILOSEC) 40 MG capsule, Take 1 capsule (40 mg total) by mouth daily., Disp: 90 capsule, Rfl: 0 .  rosuvastatin (CRESTOR) 10 MG tablet, Take 0.5 tablets (5 mg total) by mouth daily., Disp: 60 tablet, Rfl: 1 .  Vitamin D, Ergocalciferol, (DRISDOL) 1.25 MG (50000 UNIT) CAPS capsule, Take 1 capsule (50,000 Units total) by mouth every 7 (seven) days., Disp: 12 capsule, Rfl: 1 .  metoprolol tartrate (LOPRESSOR) 25 MG tablet, Take 1 tablet (25 mg total) by mouth 2 (two) times daily. (Patient taking differently: Take 25 mg by mouth daily. ), Disp: 60 tablet, Rfl: 0  Allergies  Allergen  Reactions  . Depo-Medrol [Methylprednisolone Sodium Succ] Rash     ROS: Review of Systems Pertinent items noted in HPI and remainder of comprehensive ROS otherwise negative.    Physical exam BP 109/71   Pulse 99   Temp 97.8 F (36.6 C)   Ht 5' 10.5" (1.791 m)   Wt 227 lb 3.2 oz (103.1 kg)   SpO2 95%   BMI 32.14 kg/m  General appearance: alert, cooperative, appears stated age and no distress Head: Normocephalic, without obvious abnormality, atraumatic Eyes: negative findings: lids and lashes normal, conjunctivae and sclerae normal, corneas clear and pupils equal, round, reactive to light and accomodation Ears: normal TM's and external ear canals both ears Nose: Nares normal. Septum midline. Mucosa normal. No drainage or sinus tenderness. Throat: lips, mucosa, and tongue normal; teeth and gums normal Neck: no adenopathy, no carotid bruit, supple, symmetrical, trachea midline and thyroid not enlarged, symmetric, no tenderness/mass/nodules Back: symmetric, no curvature. ROM normal. No CVA tenderness. Lungs: clear to auscultation bilaterally Chest wall: no tenderness Heart: regular rate and rhythm, S1, S2 normal, no murmur, click, rub or gallop Abdomen: soft, non-tender; bowel sounds normal; no masses,  no organomegaly Male genitalia: normal, penis: no lesions or discharge. testes: no masses or tenderness. no hernias Rectal: normal tone, normal prostate, no masses or tenderness Extremities: extremities normal, atraumatic, no cyanosis or edema Pulses: 2+ and symmetric Skin: Several pigmented nevi noted along the back.  He has a couple of seborrheic keratoses but nothing appears malignant Lymph nodes: Cervical, supraclavicular, and axillary nodes normal. Neurologic: Alert and oriented X 3, normal strength and tone. Normal symmetric reflexes. Normal coordination and gait Psych: Mood stable, speech normal, affect appropriate, pleasant and interactive  Depression screen [facility] 2/9 09/15/2019  01/07/2019 06/29/2018  Decreased Interest 0 0 0  Down, Depressed, Hopeless 0 0 0  PHQ - 2 Score 0 0 0    Assessment/ Plan: [name] here for annual physical exam.   1. Annual physical exam  2. Thoracic aortic atherosclerosis ([place]) Check fasting labs as below.  Smoking cessation reinforced  3. Pure hypercholesterolemia Refill sent.  Patient will come in for fasting labs - CMP14+EGFR; Future - Lipid panel; Future - TSH; Future  4. Screening for malignant neoplasm of prostate - PSA; Future  5. Tobacco use Patient is in the action phase of smoking cessation.  Chantix prescription provided.  Meds ordered this encounter  Medications  . varenicline (CHANTIX STARTING MONTH PAK) 0.5 MG X 11 & 1 MG X 42 tablet  Sig: Take 0.5 mg tablet by mouth once daily x3 days, then 0.5 mg tablet twice daily x4 days, then increase to one 1 mg tablet twice daily.    Dispense:  53 tablet    Refill:  0  . varenicline (CHANTIX CONTINUING MONTH PAK) 1 MG tablet    Sig: Take 1 tablet (1 mg total) by mouth 2 (two) times daily.    Dispense:  60 tablet    Refill:  2  . febuxostat (ULORIC) 40 MG tablet    Sig: Take 1 tablet (40 mg total) by mouth daily.    Dispense:  90 tablet    Refill:  3  . omeprazole (PRILOSEC) 40 MG capsule    Sig: Take 1 capsule (40 mg total) by mouth daily.    Dispense:  90 capsule    Refill:  3  . icosapent Ethyl (VASCEPA) 1 g capsule    Sig: Take 2 capsules (2 g total) by mouth 2 (two) times daily.    Dispense:  360 capsule    Refill:  3  . Vitamin D, Ergocalciferol, (DRISDOL) 1.25 MG (50000 UNIT) CAPS capsule    Sig: Take 1 capsule (50,000 Units total) by mouth every 7 (seven) days.    Dispense:  12 capsule    Refill:  3      Counseled on healthy lifestyle choices, including diet (rich in fruits, vegetables and lean meats and low in salt and simple carbohydrates) and exercise (at least 30 minutes of moderate physical activity daily).  Patient to follow up in 1  year for annual exam or sooner if needed.  Wren Pryce M. Lajuana Ripple, DO

## 2019-09-20 ENCOUNTER — Other Ambulatory Visit: Payer: Self-pay

## 2019-09-20 ENCOUNTER — Other Ambulatory Visit: Payer: BC Managed Care – PPO

## 2019-09-20 DIAGNOSIS — E7800 Pure hypercholesterolemia, unspecified: Secondary | ICD-10-CM

## 2019-09-20 DIAGNOSIS — Z125 Encounter for screening for malignant neoplasm of prostate: Secondary | ICD-10-CM

## 2019-09-21 LAB — LIPID PANEL
Chol/HDL Ratio: 3.6 ratio (ref 0.0–5.0)
Cholesterol, Total: 132 mg/dL (ref 100–199)
HDL: 37 mg/dL — ABNORMAL LOW (ref >39)
LDL Chol Calc (NIH): 59 mg/dL (ref 0–99)
Triglycerides: 223 mg/dL — ABNORMAL HIGH (ref 0–149)
VLDL Cholesterol Cal: 36 mg/dL (ref 5–40)

## 2019-09-21 LAB — CMP14+EGFR
ALT: 21 IU/L (ref 0–44)
AST: 21 IU/L (ref 0–40)
Albumin/Globulin Ratio: 2.5 — ABNORMAL HIGH (ref 1.2–2.2)
Albumin: 4.7 g/dL (ref 3.8–4.9)
Alkaline Phosphatase: 62 IU/L (ref 48–121)
BUN/Creatinine Ratio: 13 (ref 9–20)
BUN: 12 mg/dL (ref 6–24)
Bilirubin Total: 0.4 mg/dL (ref 0.0–1.2)
CO2: 21 mmol/L (ref 20–29)
Calcium: 10 mg/dL (ref 8.7–10.2)
Chloride: 105 mmol/L (ref 96–106)
Creatinine, Ser: 0.91 mg/dL (ref 0.76–1.27)
GFR calc Af Amer: 112 mL/min/{1.73_m2} (ref >59)
GFR calc non Af Amer: 97 mL/min/{1.73_m2} (ref >59)
Globulin, Total: 1.9 g/dL (ref 1.5–4.5)
Glucose: 87 mg/dL (ref 65–99)
Potassium: 4.2 mmol/L (ref 3.5–5.2)
Sodium: 143 mmol/L (ref 134–144)
Total Protein: 6.6 g/dL (ref 6.0–8.5)

## 2019-09-21 LAB — TSH: TSH: 1.16 u[IU]/mL (ref 0.450–4.500)

## 2019-09-21 LAB — PSA: Prostate Specific Ag, Serum: 0.2 ng/mL (ref 0.0–4.0)

## 2019-09-28 ENCOUNTER — Telehealth (INDEPENDENT_AMBULATORY_CARE_PROVIDER_SITE_OTHER): Payer: BC Managed Care – PPO | Admitting: Family Medicine

## 2019-09-28 ENCOUNTER — Encounter: Payer: Self-pay | Admitting: Family Medicine

## 2019-09-28 DIAGNOSIS — J0100 Acute maxillary sinusitis, unspecified: Secondary | ICD-10-CM

## 2019-09-28 MED ORDER — AMOXICILLIN-POT CLAVULANATE 875-125 MG PO TABS: 1.0000 | ORAL_TABLET | Freq: Two times a day (BID) | ORAL | 0 refills | Status: DC

## 2019-09-28 NOTE — Progress Notes (Signed)
    Subjective:    Patient ID: Scott Rasmussen, male    DOB: [birth year], 54 y.o.   MRN: [id number]   HPI: Scott Rasmussen is a 54 y.o. male presenting for Symptoms include congestion, facial pain, nasal congestion, no cough, post nasal drip and sinus pressure. There is no fever, chills, or sweats. Onset of symptoms was a few days ago, gradually worsening since that time.    Depression screen [facility] 2/9 09/15/2019 01/07/2019 06/29/2018 02/23/2018 10/08/2017  Decreased Interest 0 0 0 0 0  Down, Depressed, Hopeless 0 0 0 0 0  PHQ - 2 Score 0 0 0 0 0     Relevant past medical, surgical, family and social history reviewed and updated as indicated.  Interim medical history since our last visit reviewed. Allergies and medications reviewed and updated.  ROS:  Review of Systems  Constitutional: Positive for fatigue. Negative for fever.  HENT: Positive for congestion, postnasal drip and sinus pressure.   Respiratory: Negative for shortness of breath.   Neurological: Positive for headaches.     Social History   Tobacco Use  Smoking Status Former Smoker  . Packs/day: 1.00  . Years: 15.00  . Pack years: 15.00  . Types: Cigarettes  . Quit date: 2012  . Years since quitting: 9.4  Smokeless Tobacco Never Used       Objective:     Wt Readings from Last 3 Encounters:  09/15/19 227 lb 3.2 oz (103.1 kg)  07/05/19 235 lb (106.6 kg)  06/28/19 241 lb (109.3 kg)     Exam deferred. Pt. Harboring due to COVID 19.Video  visit performed.    This demonstrated the patient in no apparent distress.  No lesions.  No rash on the face.  Active alert oriented x3. Assessment & Plan:   1. Acute maxillary sinusitis, recurrence not specified     Meds ordered this encounter  Medications  . amoxicillin-clavulanate (AUGMENTIN) 875-125 MG tablet    Sig: Take 1 tablet by mouth 2 (two) times daily. Take all of this medication    Dispense:  20 tablet    Refill:  0    No orders of the defined types were  placed in this encounter.     Diagnoses and all orders for this visit:  Acute maxillary sinusitis, recurrence not specified  Other orders -     amoxicillin-clavulanate (AUGMENTIN) 875-125 MG tablet; Take 1 tablet by mouth 2 (two) times daily. Take all of this medication    Virtual Visit via Video Note  I discussed the limitations, risks, security and privacy concerns of performing an evaluation and management service by video and the availability of in person appointments. The patient was identified with two identifiers. Pt.expressed understanding and agreed to proceed. Pt. Is at home. Dr. Darlyn Read is in his office.  Follow Up Instructions:   I discussed the assessment and treatment plan with the patient. The patient was provided an opportunity to ask questions and all were answered. The patient agreed with the plan and demonstrated an understanding of the instructions.   The patient was advised to call back or seek an in-person evaluation if the symptoms worsen or if the condition fails to improve as anticipated.   Total minutes including chart review and phone contact time: 8   Follow up plan: No follow-ups on file.  Mechele Claude, MD [place] [facility]

## 2019-10-12 ENCOUNTER — Other Ambulatory Visit: Payer: Self-pay | Admitting: Podiatry

## 2019-10-12 ENCOUNTER — Telehealth: Payer: Self-pay | Admitting: Podiatry

## 2019-10-12 MED ORDER — MELOXICAM 15 MG PO TABS
15.0000 mg | ORAL_TABLET | Freq: Every day | ORAL | 0 refills | Status: AC
Start: 2019-10-12 — End: 2019-11-11

## 2019-10-12 NOTE — Telephone Encounter (Signed)
Sent. Please let him know

## 2019-10-12 NOTE — Telephone Encounter (Signed)
Pt called requesting refill of meloxicam. Please send to [facility] in [facility]

## 2019-10-13 ENCOUNTER — Ambulatory Visit: Payer: BC Managed Care – PPO | Admitting: Podiatry

## 2019-10-13 NOTE — Telephone Encounter (Signed)
Left message informing pt the medication had been sent to the [facility].

## 2019-10-28 ENCOUNTER — Ambulatory Visit (INDEPENDENT_AMBULATORY_CARE_PROVIDER_SITE_OTHER): Payer: BC Managed Care – PPO | Admitting: Nurse Practitioner

## 2019-10-28 DIAGNOSIS — R05 Cough: Secondary | ICD-10-CM | POA: Diagnosis not present

## 2019-10-28 DIAGNOSIS — R059 Cough, unspecified: Secondary | ICD-10-CM | POA: Insufficient documentation

## 2019-10-28 MED ORDER — BENZONATATE 100 MG PO CAPS
100.0000 mg | ORAL_CAPSULE | Freq: Two times a day (BID) | ORAL | 0 refills | Status: DC | PRN
Start: 2019-10-28 — End: 2019-11-01

## 2019-10-28 MED ORDER — AZITHROMYCIN 250 MG PO TABS
ORAL_TABLET | ORAL | 0 refills | Status: DC
Start: 2019-10-28 — End: 2019-11-25

## 2019-10-28 MED ORDER — SALINE SPRAY 0.65 % NA SOLN
1.0000 | NASAL | 0 refills | Status: DC | PRN
Start: 2019-10-28 — End: 2020-04-06

## 2019-10-28 NOTE — Assessment & Plan Note (Signed)
Patient is a 54 year old male who is visiting over teleconference with complaints of cough.  Cough has been ongoing for over 3 weeks.  Patient has been treated in the past with amoxicillin with mild effect.  Patient is reporting increased congestion with phlegm worsening in the last 5 days.  Patient is reporting increased drainage head pressure and wheezing.  He denies fever, headache, nausea or vomiting. Started patient on Tessalon Perles 100 mg twice a day as needed for cough, nasal saline spray, and Zithromax. Provided education over the phone. Patient knows to follow-up with worsening or unresolved symptoms.

## 2019-10-28 NOTE — Progress Notes (Signed)
Virtual Visit via telephone Note  I connected with Scott Rasmussen on 10/28/19 at home by telephone and verified that I am speaking with the correct person using two identifiers. Scott Rasmussen is currently located at home and no  Family member is with patient during visit. The provider, Daryll Drown, NP is located in their office at time of visit.  Call ended at 08:15 am  I discussed the limitations, risks, security and privacy concerns of performing an evaluation and management service by telephone and the availability of in person appointments. I also discussed with the patient that there may be a patient responsible charge related to this service. The patient expressed understanding and agreed to proceed.   History and Present Illness:  Patient is visiting over tele- conference with complaint of cough.  Cough has being ongoing for over 3 weeks.  Patient has been treated in the past with amoxicillin.  Patient is reporting increased congestion with phlegm worsening since Sunday 5 days ago.  Patient is reporting increased drainage, head pressure and wheezing.  He denies fever, headache, nausea or vomiting.    1. Cough     Outpatient Encounter Medications as of 10/28/2019  Medication Sig  . amoxicillin-clavulanate (AUGMENTIN) 875-125 MG tablet Take 1 tablet by mouth 2 (two) times daily. Take all of this medication  . febuxostat (ULORIC) 40 MG tablet Take 1 tablet (40 mg total) by mouth daily.  Marland Kitchen icosapent Ethyl (VASCEPA) 1 g capsule Take 2 capsules (2 g total) by mouth 2 (two) times daily.  . meloxicam (MOBIC) 15 MG tablet Take 1 tablet (15 mg total) by mouth daily.  . meloxicam (MOBIC) 15 MG tablet Take 1 tablet (15 mg total) by mouth daily.  . metoprolol tartrate (LOPRESSOR) 25 MG tablet Take 1 tablet (25 mg total) by mouth 2 (two) times daily. (Patient taking differently: Take 25 mg by mouth daily. )  . omeprazole (PRILOSEC) 40 MG capsule Take 1 capsule (40 mg total) by mouth daily.   . rosuvastatin (CRESTOR) 10 MG tablet Take 0.5 tablets (5 mg total) by mouth daily.  . varenicline (CHANTIX CONTINUING MONTH PAK) 1 MG tablet Take 1 tablet (1 mg total) by mouth 2 (two) times daily.  . varenicline (CHANTIX STARTING MONTH PAK) 0.5 MG X 11 & 1 MG X 42 tablet Take 0.5 mg tablet by mouth once daily x3 days, then 0.5 mg tablet twice daily x4 days, then increase to one 1 mg tablet twice daily.  . Vitamin D, Ergocalciferol, (DRISDOL) 1.25 MG (50000 UNIT) CAPS capsule Take 1 capsule (50,000 Units total) by mouth every 7 (seven) days.   No facility-administered encounter medications on file as of 10/28/2019.    Review of Systems  Constitutional: Negative.   HENT: Negative.   Eyes: Negative.   Respiratory: Positive for cough and wheezing.   Cardiovascular: Negative for chest pain.  Gastrointestinal: Negative.   Genitourinary: Negative.   Musculoskeletal: Negative.   Skin: Negative.   Neurological: Negative.     Observations/Objective:   Assessment and Plan: Problem List Items Addressed This Visit      Other   Cough - Primary    Patient is a 54 year old male who is visiting over teleconference with complaints of cough.  Cough has been ongoing for over 3 weeks.  Patient has been treated in the past with amoxicillin with mild effect.  Patient is reporting increased congestion with phlegm worsening in the last 5 days.  Patient is reporting increased drainage head  pressure and wheezing.  He denies fever, headache, nausea or vomiting. Started patient on Tessalon Perles 100 mg twice a day as needed for cough, nasal saline spray, and Zithromax. Provided education over the phone. Patient knows to follow-up with worsening or unresolved symptoms.           Follow up plan: No follow-ups on file.    I discussed the assessment and treatment plan with the patient. The patient was provided an opportunity to ask questions and all were answered. The patient agreed with the plan and  demonstrated an understanding of the instructions.   The patient was advised to call back or seek an in-person evaluation if the symptoms worsen or if the condition fails to improve as anticipated.  The above assessment and management plan was discussed with the patient. The patient verbalized understanding of and has agreed to the management plan. Patient is aware to call the clinic if symptoms persist or worsen. Patient is aware when to return to the clinic for a follow-up visit. Patient educated on when it is appropriate to go to the emergency department.    I provided 10 minutes of non-face-to-face time during this encounter.    Daryll Drown, NP

## 2019-11-01 ENCOUNTER — Encounter: Payer: Self-pay | Admitting: Nurse Practitioner

## 2019-11-01 ENCOUNTER — Other Ambulatory Visit: Payer: Self-pay | Admitting: Nurse Practitioner

## 2019-11-01 MED ORDER — BENZONATATE 100 MG PO CAPS: 100.0000 mg | ORAL_CAPSULE | Freq: Two times a day (BID) | ORAL | 0 refills | Status: DC | PRN

## 2019-11-01 MED ORDER — AMOXICILLIN-POT CLAVULANATE 875-125 MG PO TABS: 1.0000 | ORAL_TABLET | Freq: Two times a day (BID) | ORAL | 0 refills | Status: DC

## 2019-11-04 DIAGNOSIS — M25522 Pain in left elbow: Secondary | ICD-10-CM | POA: Diagnosis not present

## 2019-11-04 DIAGNOSIS — M5416 Radiculopathy, lumbar region: Secondary | ICD-10-CM | POA: Diagnosis not present

## 2019-11-25 ENCOUNTER — Other Ambulatory Visit: Payer: Self-pay | Admitting: Family Medicine

## 2019-11-25 ENCOUNTER — Encounter: Payer: Self-pay | Admitting: Family Medicine

## 2019-11-25 DIAGNOSIS — K7689 Other specified diseases of liver: Secondary | ICD-10-CM

## 2019-11-25 DIAGNOSIS — K769 Liver disease, unspecified: Secondary | ICD-10-CM

## 2019-11-29 ENCOUNTER — Telehealth: Payer: Self-pay | Admitting: Family Medicine

## 2019-12-01 DIAGNOSIS — M5136 Other intervertebral disc degeneration, lumbar region: Secondary | ICD-10-CM | POA: Diagnosis not present

## 2019-12-07 ENCOUNTER — Other Ambulatory Visit: Payer: Self-pay

## 2019-12-07 ENCOUNTER — Ambulatory Visit (HOSPITAL_COMMUNITY)
Admission: RE | Admit: 2019-12-07 | Discharge: 2019-12-07 | Disposition: A | Discharge status: Home / Self Care | Payer: BC Managed Care – PPO | Source: Ambulatory Visit | Attending: Family Medicine | Admitting: Family Medicine

## 2019-12-07 DIAGNOSIS — D1803 Hemangioma of intra-abdominal structures: Secondary | ICD-10-CM | POA: Diagnosis not present

## 2019-12-07 DIAGNOSIS — K769 Liver disease, unspecified: Secondary | ICD-10-CM | POA: Insufficient documentation

## 2019-12-07 DIAGNOSIS — K760 Fatty (change of) liver, not elsewhere classified: Secondary | ICD-10-CM | POA: Diagnosis not present

## 2019-12-07 DIAGNOSIS — K7689 Other specified diseases of liver: Secondary | ICD-10-CM | POA: Diagnosis not present

## 2019-12-07 DIAGNOSIS — N281 Cyst of kidney, acquired: Secondary | ICD-10-CM | POA: Diagnosis not present

## 2019-12-07 MED ORDER — GADOBUTROL 1 MMOL/ML IV SOLN
10.0000 mL | Freq: Once | INTRAVENOUS | Status: AC | PRN
  Administered 2019-12-07: 10 mL via INTRAVENOUS

## 2019-12-08 ENCOUNTER — Ambulatory Visit: Payer: BC Managed Care – PPO | Admitting: Podiatry

## 2019-12-20 DIAGNOSIS — D485 Neoplasm of uncertain behavior of skin: Secondary | ICD-10-CM | POA: Diagnosis not present

## 2019-12-20 DIAGNOSIS — D1801 Hemangioma of skin and subcutaneous tissue: Secondary | ICD-10-CM | POA: Diagnosis not present

## 2019-12-20 DIAGNOSIS — D489 Neoplasm of uncertain behavior, unspecified: Secondary | ICD-10-CM | POA: Diagnosis not present

## 2019-12-20 DIAGNOSIS — R21 Rash and other nonspecific skin eruption: Secondary | ICD-10-CM | POA: Diagnosis not present

## 2019-12-20 DIAGNOSIS — L918 Other hypertrophic disorders of the skin: Secondary | ICD-10-CM | POA: Diagnosis not present

## 2019-12-20 DIAGNOSIS — D229 Melanocytic nevi, unspecified: Secondary | ICD-10-CM | POA: Diagnosis not present

## 2020-01-03 ENCOUNTER — Ambulatory Visit: Payer: BC Managed Care – PPO | Admitting: Podiatry

## 2020-01-05 ENCOUNTER — Encounter: Payer: Self-pay | Admitting: Nurse Practitioner

## 2020-01-05 ENCOUNTER — Encounter: Payer: Self-pay | Admitting: Family Medicine

## 2020-01-05 ENCOUNTER — Ambulatory Visit (INDEPENDENT_AMBULATORY_CARE_PROVIDER_SITE_OTHER): Payer: BC Managed Care – PPO | Admitting: Nurse Practitioner

## 2020-01-05 VITALS — Temp 98.6°F

## 2020-01-05 DIAGNOSIS — J0100 Acute maxillary sinusitis, unspecified: Secondary | ICD-10-CM | POA: Diagnosis not present

## 2020-01-05 MED ORDER — AZITHROMYCIN 250 MG PO TABS: ORAL_TABLET | ORAL | 0 refills | Status: DC

## 2020-01-05 NOTE — Patient Instructions (Signed)
Sinusitis, Adult Sinusitis is inflammation of your sinuses. Sinuses are hollow spaces in the bones around your face. Your sinuses are located:  Around your eyes.  In the middle of your forehead.  Behind your nose.  In your cheekbones. Mucus normally drains out of your sinuses. When your nasal tissues become inflamed or swollen, mucus can become trapped or blocked. This allows bacteria, viruses, and fungi to grow, which leads to infection. Most infections of the sinuses are caused by a virus. Sinusitis can develop quickly. It can last for up to 4 weeks (acute) or for more than 12 weeks (chronic). Sinusitis often develops after a cold. What are the causes? This condition is caused by anything that creates swelling in the sinuses or stops mucus from draining. This includes:  Allergies.  Asthma.  Infection from bacteria or viruses.  Deformities or blockages in your nose or sinuses.  Abnormal growths in the nose (nasal polyps).  Pollutants, such as chemicals or irritants in the air.  Infection from fungi (rare). What increases the risk? You are more likely to develop this condition if you:  Have a weak body defense system (immune system).  Do a lot of swimming or diving.  Overuse nasal sprays.  Smoke. What are the signs or symptoms? The main symptoms of this condition are pain and a feeling of pressure around the affected sinuses. Other symptoms include:  Stuffy nose or congestion.  Thick drainage from your nose.  Swelling and warmth over the affected sinuses.  Headache.  Upper toothache.  A cough that may get worse at night.  Extra mucus that collects in the throat or the back of the nose (postnasal drip).  Decreased sense of smell and taste.  Fatigue.  A fever.  Sore throat.  Bad breath. How is this diagnosed? This condition is diagnosed based on:  Your symptoms.  Your medical history.  A physical exam.  Tests to find out if your condition is  acute or chronic. This may include: ? Checking your nose for nasal polyps. ? Viewing your sinuses using a device that has a light (endoscope). ? Testing for allergies or bacteria. ? Imaging tests, such as an MRI or CT scan. In rare cases, a bone biopsy may be done to rule out more serious types of fungal sinus disease. How is this treated? Treatment for sinusitis depends on the cause and whether your condition is chronic or acute.  If caused by a virus, your symptoms should go away on their own within 10 days. You may be given medicines to relieve symptoms. They include: ? Medicines that shrink swollen nasal passages (topical intranasal decongestants). ? Medicines that treat allergies (antihistamines). ? A spray that eases inflammation of the nostrils (topical intranasal corticosteroids). ? Rinses that help get rid of thick mucus in your nose (nasal saline washes).  If caused by bacteria, your health care provider may recommend waiting to see if your symptoms improve. Most bacterial infections will get better without antibiotic medicine. You may be given antibiotics if you have: ? A severe infection. ? A weak immune system.  If caused by narrow nasal passages or nasal polyps, you may need to have surgery. Follow these instructions at home: Medicines  Take, use, or apply over-the-counter and prescription medicines only as told by your health care provider. These may include nasal sprays.  If you were prescribed an antibiotic medicine, take it as told by your health care provider. Do not stop taking the antibiotic even if you start   to feel better. Hydrate and humidify   Drink enough fluid to keep your urine pale yellow. Staying hydrated will help to thin your mucus.  Use a cool mist humidifier to keep the humidity level in your home above 50%.  Inhale steam for 10-15 minutes, 3-4 times a day, or as told by your health care provider. You can do this in the bathroom while a hot shower is  running.  Limit your exposure to cool or dry air. Rest  Rest as much as possible.  Sleep with your head raised (elevated).  Make sure you get enough sleep each night. General instructions   Apply a warm, moist washcloth to your face 3-4 times a day or as told by your health care provider. This will help with discomfort.  Wash your hands often with soap and water to reduce your exposure to germs. If soap and water are not available, use hand sanitizer.  Do not smoke. Avoid being around people who are smoking (secondhand smoke).  Keep all follow-up visits as told by your health care provider. This is important. Contact a health care provider if:  You have a fever.  Your symptoms get worse.  Your symptoms do not improve within 10 days. Get help right away if:  You have a severe headache.  You have persistent vomiting.  You have severe pain or swelling around your face or eyes.  You have vision problems.  You develop confusion.  Your neck is stiff.  You have trouble breathing. Summary  Sinusitis is soreness and inflammation of your sinuses. Sinuses are hollow spaces in the bones around your face.  This condition is caused by nasal tissues that become inflamed or swollen. The swelling traps or blocks the flow of mucus. This allows bacteria, viruses, and fungi to grow, which leads to infection.  If you were prescribed an antibiotic medicine, take it as told by your health care provider. Do not stop taking the antibiotic even if you start to feel better.  Keep all follow-up visits as told by your health care provider. This is important. This information is not intended to replace advice given to you by your health care provider. Make sure you discuss any questions you have with your health care provider. Document Revised: 09/07/2017 Document Reviewed: 09/07/2017 Elsevier Patient Education  2020 [employer]  

## 2020-01-05 NOTE — Progress Notes (Signed)
° °  Virtual Visit via telephone Note Due to COVID-19 pandemic this visit was conducted virtually. This visit type was conducted due to national recommendations for restrictions regarding the COVID-19 Pandemic (e.g. social distancing, sheltering in place) in an effort to limit this patient's exposure and mitigate transmission in our community. All issues noted in this document were discussed and addressed.  A physical exam was not performed with this format.  I connected with Scott Rasmussen on 01/05/20 at 11:18 by telephone and verified that I am speaking with the correct person using two identifiers. Scott Rasmussen is currently located at home and no one  is currently with patient during visit. The provider, Daryll Drown, NP is located in their office at time of visit.  I discussed the limitations, risks, security and privacy concerns of performing an evaluation and management service by telephone and the availability of in person appointments. I also discussed with the patient that there may be a patient responsible charge related to this service. The patient expressed understanding and agreed to proceed.   History and Present Illness:  Sinusitis This is a recurrent problem. The current episode started in the past 7 days. The problem is unchanged. There has been no fever. His pain is at a severity of 6/10. Associated symptoms include congestion, coughing and headaches. Pertinent negatives include no chills, ear pain, shortness of breath or sore throat. Past treatments include oral decongestants. The treatment provided no relief.      Review of Systems  Constitutional: Negative for chills and fever.  HENT: Positive for congestion and sinus pain. Negative for ear pain and sore throat.   Respiratory: Positive for cough. Negative for shortness of breath.   Skin: Negative for rash.  Neurological: Positive for headaches.  All other systems reviewed and are  negative.    Observations/Objective: Virtual visit  Assessment and Plan:  Acute maxillary sinusitis Patient is a 54 year old male who is seen via virtual visit for acute maxillary sinusitis.  Symptoms started 5 to 6 days ago patient is reporting increased cough, head pressure, congestion, headache, patient reports being tested for COVID-19 last month and is negative.  Patient reports this is not new he has tried Mucinex over-the-counter, and Sudafed with no relief.  Provided education to patient. Started patient on a Z-Pak.  Advised patient to increase hydration, Tylenol for headache, and Mucinex for decongestion and loosening up phlegm  Follow-up with unresolved or worsening symptoms.  Rx sent to pharmacy.  Follow Up Instructions: As needed with worsening or unresolved symptom    I discussed the assessment and treatment plan with the patient. The patient was provided an opportunity to ask questions and all were answered. The patient agreed with the plan and demonstrated an understanding of the instructions.   The patient was advised to call back or seek an in-person evaluation if the symptoms worsen or if the condition fails to improve as anticipated.  The above assessment and management plan was discussed with the patient. The patient verbalized understanding of and has agreed to the management plan. Patient is aware to call the clinic if symptoms persist or worsen. Patient is aware when to return to the clinic for a follow-up visit. Patient educated on when it is appropriate to go to the emergency department.   Time call ended:  11:27 am  I provided 9 minutes of non-face-to-face time during this encounter.    Daryll Drown, NP

## 2020-01-05 NOTE — Assessment & Plan Note (Signed)
Patient is a 54 year old male who is seen via virtual visit for acute maxillary sinusitis.  Symptoms started 5 to 6 days ago patient is reporting increased cough, head pressure, congestion, headache, patient reports being tested for COVID-19 last month and is negative.  Patient reports this is not new he has tried Mucinex over-the-counter, and Sudafed with no relief.  Provided education to patient. Started patient on a Z-Pak.  Advised patient to increase hydration, Tylenol for headache, and Mucinex for decongestion and loosening up phlegm  Follow-up with unresolved or worsening symptoms.  Rx sent to pharmacy.

## 2020-01-11 ENCOUNTER — Other Ambulatory Visit: Payer: Self-pay

## 2020-01-11 ENCOUNTER — Encounter: Payer: Self-pay | Admitting: Family Medicine

## 2020-01-11 ENCOUNTER — Ambulatory Visit: Payer: BC Managed Care – PPO | Admitting: Family Medicine

## 2020-01-11 ENCOUNTER — Telehealth: Payer: Self-pay | Admitting: Family Medicine

## 2020-01-11 VITALS — BP 143/94 | HR 82 | Temp 97.4°F | Ht 70.5 in | Wt 233.0 lb

## 2020-01-11 DIAGNOSIS — F430 Acute stress reaction: Secondary | ICD-10-CM

## 2020-01-11 DIAGNOSIS — Z23 Encounter for immunization: Secondary | ICD-10-CM

## 2020-01-11 MED ORDER — ESCITALOPRAM OXALATE 10 MG PO TABS: 10.0000 mg | ORAL_TABLET | Freq: Every day | ORAL | 0 refills | Status: DC

## 2020-01-11 MED ORDER — HYDROXYZINE HCL 50 MG PO TABS: 25.0000 mg | ORAL_TABLET | Freq: Three times a day (TID) | ORAL | 1 refills | Status: DC | PRN

## 2020-01-11 NOTE — Telephone Encounter (Signed)
Yes this is fine.  He is not currently treated with anything.  So this would be a new start of meds/ evaluation
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
## 2020-01-11 NOTE — Telephone Encounter (Signed)
Pt has appt today at 11:30

## 2020-01-11 NOTE — Telephone Encounter (Signed)
Can I put him in on an acute day for you or is it ok for him to see another provider for anxiety?

## 2020-01-11 NOTE — Patient Instructions (Signed)
Avoid taking Meloxicam and Lexapro. Together you risk stomach bleeds.  Hydroxyzine for as needed use anxiety/ panic/ sleep  Taking the medicine as directed and not missing any doses is one of the best things you can do to treat your anxiety.  Here are some things to keep in mind:  1) Side effects (stomach upset, some increased anxiety) may happen before you notice a benefit.  These side effects typically go away over time. 2) Changes to your dose of medicine or a change in medication all together is sometimes necessary 3) Most people need to be on medication at least 12 months 4) Many people will notice an improvement within two weeks but the full effect of the medication can take up to 4-6 weeks 5) Stopping the medication when you start feeling better often results in a return of symptoms 6) Never discontinue your medication without contacting a health care professional first.  Some medications require gradual discontinuation/ taper and can make you sick if you stop them abruptly.  If your symptoms worsen or you have thoughts of suicide/homicide, PLEASE SEEK IMMEDIATE MEDICAL ATTENTION.  You may always call:  National Suicide Hotline: 616-629-9851 [place] Crisis Line: (501)375-1436 Crisis Recovery in [place]: 660-591-7998   These are available 24 hours a day, 7 days a week.

## 2020-01-16 DIAGNOSIS — F419 Anxiety disorder, unspecified: Secondary | ICD-10-CM | POA: Diagnosis not present

## 2020-01-23 ENCOUNTER — Encounter: Payer: Self-pay | Admitting: Family Medicine

## 2020-01-23 ENCOUNTER — Ambulatory Visit: Payer: BC Managed Care – PPO | Admitting: Family Medicine

## 2020-01-24 ENCOUNTER — Other Ambulatory Visit: Payer: Self-pay | Admitting: Family Medicine

## 2020-01-24 MED ORDER — LORATADINE 10 MG PO TABS: 10.0000 mg | ORAL_TABLET | Freq: Every day | ORAL | 3 refills | Status: DC

## 2020-01-24 MED ORDER — FLUTICASONE PROPIONATE 50 MCG/ACT NA SUSP: 2.0000 | Freq: Every day | NASAL | 3 refills | Status: DC

## 2020-01-30 ENCOUNTER — Other Ambulatory Visit: Payer: Self-pay | Admitting: Family Medicine

## 2020-01-30 DIAGNOSIS — F430 Acute stress reaction: Secondary | ICD-10-CM

## 2020-02-02 ENCOUNTER — Ambulatory Visit (INDEPENDENT_AMBULATORY_CARE_PROVIDER_SITE_OTHER): Payer: BC Managed Care – PPO | Admitting: Family Medicine

## 2020-02-02 DIAGNOSIS — F430 Acute stress reaction: Secondary | ICD-10-CM

## 2020-02-02 DIAGNOSIS — K2100 Gastro-esophageal reflux disease with esophagitis, without bleeding: Secondary | ICD-10-CM | POA: Diagnosis not present

## 2020-02-02 DIAGNOSIS — R059 Cough, unspecified: Secondary | ICD-10-CM

## 2020-02-02 MED ORDER — BUPROPION HCL ER (XL) 150 MG PO TB24: 150.0000 mg | ORAL_TABLET | Freq: Every day | ORAL | 0 refills | Status: DC

## 2020-02-02 MED ORDER — ESCITALOPRAM OXALATE 10 MG PO TABS: 10.0000 mg | ORAL_TABLET | Freq: Every day | ORAL | 0 refills | Status: DC

## 2020-02-02 MED ORDER — BENZONATATE 200 MG PO CAPS: 200.0000 mg | ORAL_CAPSULE | Freq: Two times a day (BID) | ORAL | 0 refills | Status: DC | PRN

## 2020-02-02 MED ORDER — FAMOTIDINE 20 MG PO TABS: 20.0000 mg | ORAL_TABLET | Freq: Every day | ORAL | 0 refills | Status: DC

## 2020-02-02 NOTE — Progress Notes (Signed)
Telephone visit  Subjective: CC: stress reaction PCP: [name], DO YIF:[id number] K Caltagirone is a 54 y.o. male calls for telephone consult today. Patient provides verbal consent for consult held via phone.  Due to COVID-19 pandemic this visit was conducted virtually. This visit type was conducted due to national recommendations for restrictions regarding the COVID-19 Pandemic (e.g. social distancing, sheltering in place) in an effort to limit this patient's exposure and mitigate transmission in our community. All issues noted in this document were discussed and addressed.  A physical exam was not performed with this format.   Location of patient: home Location of provider: [facility] Others present for call: none  1. Stress reaction Patient reports that he had his first counseling session recently and he reports a good experience. He has had a lot on his mind.  He hired a Midwife recently.  He continues to have anxiety but he feels extremely uncomfortable/ anxious right now.  He worries about having more panic attacks and fears that this would endanger someone. He has been not using the Atarax lately because it has cause sleepiness even at the 1/2 tablet.  He is quitting smoking.  He reports increased acid reflux that seems to be more prevalent at nighttime.  He is compliant with his PPI.  He sometimes worries that this may be his heart because the last time that he was having uncontrolled reflux he had had changes on his cardiac work-up.  However, he denies any overt chest pain, shortness of breath, nausea or vomiting.  He is compliant with his cholesterol medications.   ROS: Per HPI  Allergies  Allergen Reactions  . Depo-Medrol [Methylprednisolone Sodium Succ] Rash   Past Medical History:  Diagnosis Date  . Gout   . Hallux rigidus 10/11/2018   right   . Hyperlipidemia   . Shortness of breath   . Sleep apnea    had sx   . Umbilical hernia     Current Outpatient  Medications:  .  escitalopram (LEXAPRO) 10 MG tablet, Take 1 tablet (10 mg total) by mouth daily., Disp: 30 tablet, Rfl: 0 .  febuxostat (ULORIC) 40 MG tablet, Take 1 tablet (40 mg total) by mouth daily., Disp: 90 tablet, Rfl: 3 .  fluticasone (FLONASE) 50 MCG/ACT nasal spray, Place 2 sprays into both nostrils daily., Disp: 48 g, Rfl: 3 .  hydrOXYzine (ATARAX/VISTARIL) 50 MG tablet, Take 1/2 to 1 tablet (25-50 mg total) by mouth every 8 (eight) hours as needed for anxiety (sleep)., Disp: 30 tablet, Rfl: 0 .  icosapent Ethyl (VASCEPA) 1 g capsule, Take 2 capsules (2 g total) by mouth 2 (two) times daily., Disp: 360 capsule, Rfl: 3 .  loratadine (CLARITIN) 10 MG tablet, Take 1 tablet (10 mg total) by mouth daily., Disp: 90 tablet, Rfl: 3 .  meloxicam (MOBIC) 15 MG tablet, Take 1 tablet (15 mg total) by mouth daily., Disp: 30 tablet, Rfl: 1 .  metoprolol tartrate (LOPRESSOR) 25 MG tablet, Take 1 tablet (25 mg total) by mouth 2 (two) times daily. (Patient taking differently: Take 25 mg by mouth daily. ), Disp: 60 tablet, Rfl: 0 .  omeprazole (PRILOSEC) 40 MG capsule, Take 1 capsule (40 mg total) by mouth daily., Disp: 90 capsule, Rfl: 3 .  rosuvastatin (CRESTOR) 10 MG tablet, Take 0.5 tablets (5 mg total) by mouth daily., Disp: 60 tablet, Rfl: 1 .  sodium chloride (OCEAN) 0.65 % SOLN nasal spray, Place 1 spray into both nostrils as needed for congestion.,  Disp: 60 mL, Rfl: 0 .  Vitamin D, Ergocalciferol, (DRISDOL) 1.25 MG (50000 UNIT) CAPS capsule, Take 1 capsule (50,000 Units total) by mouth every 7 (seven) days., Disp: 12 capsule, Rfl: 3  Depression screen [facility] 2/9 02/02/2020 01/11/2020 09/15/2019  Decreased Interest 1 0 0  Down, Depressed, Hopeless 0 0 0  PHQ - 2 Score 1 0 0  Altered sleeping 2 2 -  Tired, decreased energy 3 - -  Change in appetite 3 - -  Feeling bad or failure about yourself  0 - -  Trouble concentrating 0 - -  Moving slowly or fidgety/restless 1 - -  Suicidal thoughts 0 - -   PHQ-9 Score 10 2 -  Difficult doing work/chores Very difficult - -   GAD 7 : Generalized Anxiety Score 02/02/2020 01/17/2020  Nervous, Anxious, on Edge 0 3  Control/stop worrying 2 3  Worry too much - different things 1 3  Trouble relaxing 0 3  Restless 1 2  Easily annoyed or irritable 0 0  Afraid - awful might happen 0 0  Total GAD 7 Score 4 14  Anxiety Difficulty Somewhat difficult Very difficult    Assessment/ Plan: 54 y.o. male    1. Acute stress reaction Continue Lexapro 10 mg daily for now.  I have added Wellbutrin.  Hopefully this will counteract some of the libido side effects he is experienced as well as aided in his smoking cessation journey.  Additionally, his PHQ-9 score was somewhat higher than his previous and I hope that Wellbutrin will help with this as well.  He will follow-up with me in 4 weeks.  We have scheduled an in office visit for next appointment.  I have extended out his work note through November 15. - escitalopram (LEXAPRO) 10 MG tablet; Take 1 tablet (10 mg total) by mouth daily.  Dispense: 90 tablet; Refill: 0 - buPROPion (WELLBUTRIN XL) 150 MG 24 hr tablet; Take 1 tablet (150 mg total) by mouth daily.  Dispense: 90 tablet; Refill: 0  2. Gastroesophageal reflux disease with esophagitis without hemorrhage Not controlled.  May be exacerbated by the above.  I have added Pepcid.  Continue PPI - famotidine (PEPCID) 20 MG tablet; Take 1 tablet (20 mg total) by mouth at bedtime. For acid reflux  Dispense: 90 tablet; Refill: 0  3. Cough Possibly related to the above.  Tessalon Perles sent for as needed use - benzonatate (TESSALON) 200 MG capsule; Take 1 capsule (200 mg total) by mouth 2 (two) times daily as needed for cough.  Dispense: 20 capsule; Refill: 0   Start time: 9:44am End time: 9:59am  Total time spent on patient care (including telephone call/ virtual visit): 25 minutes  Sharron Simpson Hulen Skains, DO [facility] [phone]

## 2020-02-03 ENCOUNTER — Encounter: Payer: Self-pay | Admitting: Family Medicine

## 2020-02-07 ENCOUNTER — Encounter: Payer: Self-pay | Admitting: Family Medicine

## 2020-02-07 DIAGNOSIS — Z029 Encounter for administrative examinations, unspecified: Secondary | ICD-10-CM

## 2020-02-17 DIAGNOSIS — M2021 Hallux rigidus, right foot: Secondary | ICD-10-CM | POA: Diagnosis not present

## 2020-02-19 NOTE — Progress Notes (Deleted)
Cardiology Office Note:   Date:  02/19/2020  NAME:  WYNTON HUFSTETLER    MRN: [id number] DOB:  [birth year]   PCP:  [name], DO  Cardiologist:  Reatha Harps, MD  Electrophysiologist:  None   Referring MD: [name], DO   No chief complaint on file. ***  History of Present Illness:   Scott Rasmussen is a 54 y.o. male with a hx of CAD, HLD who presents for follow-up.     Problem List 1. Covid-19 PNA 2. CAD (mild ischemia in basal to mid anterior wall, intermediate risk, NM SPECT 03/15/2019) -CAC score 0 2015 3. HLD  -T chol 104, HDL 42, LDL 48, TG 66 4. Former tobacco abuse  Past Medical History: Past Medical History:  Diagnosis Date  . Gout   . Hallux rigidus 10/11/2018   right   . Hyperlipidemia   . Shortness of breath   . Sleep apnea    had sx   . Umbilical hernia     Past Surgical History: Past Surgical History:  Procedure Laterality Date  . TONSILLECTOMY AND ADENOIDECTOMY  2008  . UVULOPALATOPHARYNGOPLASTY  2008    Current Medications: No outpatient medications have been marked as taking for the 02/21/20 encounter (Appointment) with O'Neal, Ronnald Ramp, MD.     Allergies:    Depo-medrol [methylprednisolone sodium succ]   Social History: Social History   Socioeconomic History  . Marital status: Married    Spouse name: Not on file  . Number of children: Not on file  . Years of education: Not on file  . Highest education level: Not on file  Occupational History  . Not on file  Tobacco Use  . Smoking status: Former Smoker    Packs/day: 1.00    Years: 15.00    Pack years: 15.00    Types: Cigarettes    Quit date: 2012    Years since quitting: 9.8  . Smokeless tobacco: Never Used  Vaping Use  . Vaping Use: Never used  Substance and Sexual Activity  . Alcohol use: Yes    Comment: 2 drinks per month per pt.  . Drug use: Not Currently    Types: Marijuana  . Sexual activity: Not on file  Other Topics Concern  . Not on  file  Social History Narrative   Works at [employer] Strain:   . Difficulty of Paying Living Expenses: Not on file  Food Insecurity:   . Worried About Programme researcher, broadcasting/film/video in the Last Year: Not on file  . Ran Out of Food in the Last Year: Not on file  Transportation Needs:   . Lack of Transportation (Medical): Not on file  . Lack of Transportation (Non-Medical): Not on file  Physical Activity:   . Days of Exercise per Week: Not on file  . Minutes of Exercise per Session: Not on file  Stress:   . Feeling of Stress : Not on file  Social Connections:   . Frequency of Communication with Friends and Family: Not on file  . Frequency of Social Gatherings with Friends and Family: Not on file  . Attends Religious Services: Not on file  . Active Member of Clubs or Organizations: Not on file  . Attends Banker Meetings: Not on file  . Marital Status: Not on file     Family History: The patient's ***family history includes Healthy in his mother; Heart attack  in his father. There is no history of Colon cancer, Esophageal cancer, Rectal cancer, or Stomach cancer.  ROS:   All other ROS reviewed and negative. Pertinent positives noted in the HPI.     EKGs/Labs/Other Studies Reviewed:   The following studies were personally reviewed by me today:  EKG:  EKG is *** ordered today.  The ekg ordered today demonstrates ***, and was personally reviewed by me.   Zio   1. No significant arrhythmias.  2. Rare ectopy.    NM SPECT 03/15/2019  There was no ST segment deviation noted during stress.  The left ventricular ejection fraction is mildly decreased (45-54%).  Nuclear stress EF: 54%.  Defect 1: There is a reversible defect of mild severity present in the basal anterior and mid anterior location.  Findings consistent with ischemia.  This is an intermediate risk study.   Basal to mid anterior ischemia.  Given  normal perfusion at apex, suspect diagonal branch ischemia   TTE 03/14/2019 1. Left ventricular ejection fraction, by visual estimation, is 60 to  65%. The left ventricle has normal function. Left ventricular septal wall  thickness was moderately increased. There is moderately increased left  ventricular hypertrophy.  2. Left ventricular diastolic parameters are consistent with Grade I  diastolic dysfunction (impaired relaxation).  3. Global right ventricle has normal systolic function.The right  ventricular size is normal. No increase in right ventricular wall  thickness.  4. Left atrial size was normal.  5. Right atrial size was normal.  6. The mitral valve is normal in structure. No evidence of mitral valve  regurgitation. No evidence of mitral stenosis.  7. The tricuspid valve is normal in structure. Tricuspid valve  regurgitation is mild.  8. The aortic valve is tricuspid. Aortic valve regurgitation is not  visualized. No evidence of aortic valve sclerosis or stenosis.  9. The pulmonic valve was normal in structure. Pulmonic valve  regurgitation is not visualized.  10. Normal pulmonary artery systolic pressure.  11. The inferior vena cava is normal in size with greater than 50%  respiratory variability, suggesting right atrial pressure of 3 mmHg.   Recent Labs: 04/26/2019: Hemoglobin 17.7; Platelets 303 09/20/2019: ALT 21; BUN 12; Creatinine, Ser 0.91; Potassium 4.2; Sodium 143; TSH 1.160   Recent Lipid Panel    Component Value Date/Time   CHOL 132 09/20/2019 1051   TRIG 223 (H) 09/20/2019 1051   TRIG 242 (H) 10/17/2016 0806   HDL 37 (L) 09/20/2019 1051   HDL 39 (L) 10/17/2016 0806   CHOLHDL 3.6 09/20/2019 1051   LDLCALC 59 09/20/2019 1051   LDLCALC 86 11/28/2013 1024   LDLDIRECT 53 06/28/2019 1139    Physical Exam:   VS:  There were no vitals taken for this visit.   Wt Readings from Last 3 Encounters:  01/11/20 233 lb (105.7 kg)  09/15/19 227 lb 3.2 oz  (103.1 kg)  07/05/19 235 lb (106.6 kg)    General: Well nourished, well developed, in no acute distress Heart: Atraumatic, normal size  Eyes: PEERLA, EOMI  Neck: Supple, no JVD Endocrine: No thryomegaly Cardiac: Normal S1, S2; RRR; no murmurs, rubs, or gallops Lungs: Clear to auscultation bilaterally, no wheezing, rhonchi or rales  Abd: Soft, nontender, no hepatomegaly  Ext: No edema, pulses 2+ Musculoskeletal: No deformities, BUE and BLE strength normal and equal Skin: Warm and dry, no rashes   Neuro: Alert and oriented to person, place, time, and situation, CNII-XII grossly intact, no focal deficits  Psych: Normal mood  and affect   ASSESSMENT:   JOHNNEY SCARLATA is a 54 y.o. male who presents for the following: No diagnosis found.  PLAN:   There are no diagnoses linked to this encounter.  Disposition: No follow-ups on file.  Medication Adjustments/Labs and Tests Ordered: Current medicines are reviewed at length with the patient today.  Concerns regarding medicines are outlined above.  No orders of the defined types were placed in this encounter.  No orders of the defined types were placed in this encounter.   There are no Patient Instructions on file for this visit.   Time Spent with Patient: I have spent a total of *** minutes with patient reviewing hospital notes, telemetry, EKGs, labs and examining the patient as well as establishing an assessment and plan that was discussed with the patient.  > 50% of time was spent in direct patient care.  Signed, Lenna Gilford. Flora Lipps, MD [facility]  [address], [address] [place], [place] [address] [phone]  02/19/2020 4:41 PM

## 2020-02-20 ENCOUNTER — Telehealth: Payer: Self-pay

## 2020-02-20 NOTE — Telephone Encounter (Signed)
   Primary Cardiologist: Reatha Harps, MD  Chart reviewed as part of pre-operative protocol coverage. Patient was contacted 02/20/2020 in reference to pre-operative risk assessment for pending surgery as outlined below.  Scott Rasmussen was last seen on 07/05/2019 virtually by Dr. Flora Lipps.  Since that day, Scott Rasmussen has done well.  Therefore, based on ACC/AHA guidelines, the patient would be at acceptable risk for the planned procedure without further cardiovascular testing.   The patient was advised that if he develops new symptoms prior to surgery to contact our office to arrange for a follow-up visit, and he verbalized understanding.  I will route this recommendation to the requesting party via Epic fax function and remove from pre-op pool. Please call with questions.  He may hold aspirin for 7 days prior to the surgery then restart as soon as possible after the procedure at the surgeon's discretion.   Azalee Course, [place] 02/20/2020, 8:33 PM

## 2020-02-20 NOTE — Telephone Encounter (Signed)
Mr. Scott Rasmussen is a pleasant 54 year old male with past medical history of suspected CAD based on Myoview, COVID pneumonia, hyperlipidemia and former tobacco abuse.  Myoview obtained in November 2020 showed mild ischemia in the basal to mid anterior wall, overall considered intermediate risk study.  He was managed medically given lack of symptom.   He has upcoming low risk procedure.  Overall, he has been doing well without any recent chest pain or shortness of breath.  Scott Rasmussen was able to play golf for several hours without any exertional symptoms prior to the foot issue.  Dr. Flora Lipps to review, given lack of symptom and overall low risk nature of the surgery.  We do recommend that the patient proceed with the surgery at this point.  Also, surgeon request to hold aspirin, please comment on whether or not he can hold aspirin prior to the surgery as well. Please forward your response to P CV DIV PREOP

## 2020-02-20 NOTE — Telephone Encounter (Signed)
° °  [facility] Pre-operative Risk Assessment    HEARTCARE STAFF: - Please ensure there is not already an duplicate clearance open for this procedure. - Under Visit Info/Reason for Call, type in Other and utilize the format Clearance MM/DD/YY or Clearance TBD. Do not use dashes or single digits. - If request is for dental extraction, please clarify the # of teeth to be extracted.  Request for surgical clearance:  1. What type of surgery is being performed? Right Hallux Arthrodesis   2. When is this surgery scheduled? TBD   3. What type of clearance is required (medical clearance vs. Pharmacy clearance to hold med vs. Both)? Both   4. Are there any medications that need to be held prior to surgery and how long? ASA   5. Practice name and name of physician performing surgery? [facility], Dr. Wylene Simmer   6. What is the office phone number? [phone]    7.   What is the office fax number? [phone]  8.   Anesthesia type (None, local, MAC, general) ? General    Scott Rasmussen 02/20/2020, 1:25 PM  _________________________________________________________________   (provider comments below)

## 2020-02-20 NOTE — Telephone Encounter (Signed)
OK to proceed to surgery. Yes, he may hold aspirin.   Gerri Spore T. Flora Lipps, MD [facility]  [address], [address] [place], [place] [address] [phone]  7:58 PM

## 2020-02-21 ENCOUNTER — Ambulatory Visit: Payer: BC Managed Care – PPO | Admitting: Cardiovascular Disease

## 2020-02-21 DIAGNOSIS — E782 Mixed hyperlipidemia: Secondary | ICD-10-CM

## 2020-02-21 DIAGNOSIS — I2510 Atherosclerotic heart disease of native coronary artery without angina pectoris: Secondary | ICD-10-CM

## 2020-03-02 ENCOUNTER — Other Ambulatory Visit: Payer: Self-pay

## 2020-03-02 ENCOUNTER — Ambulatory Visit (INDEPENDENT_AMBULATORY_CARE_PROVIDER_SITE_OTHER): Payer: BC Managed Care – PPO

## 2020-03-02 ENCOUNTER — Encounter: Payer: Self-pay | Admitting: Family Medicine

## 2020-03-02 ENCOUNTER — Ambulatory Visit (INDEPENDENT_AMBULATORY_CARE_PROVIDER_SITE_OTHER): Payer: BC Managed Care – PPO | Admitting: Family Medicine

## 2020-03-02 VITALS — BP 105/66 | HR 76 | Temp 96.5°F | Ht 70.5 in | Wt 232.8 lb

## 2020-03-02 DIAGNOSIS — Z720 Tobacco use: Secondary | ICD-10-CM

## 2020-03-02 DIAGNOSIS — Z01818 Encounter for other preprocedural examination: Secondary | ICD-10-CM | POA: Diagnosis not present

## 2020-03-02 DIAGNOSIS — R059 Cough, unspecified: Secondary | ICD-10-CM

## 2020-03-02 DIAGNOSIS — M2021 Hallux rigidus, right foot: Secondary | ICD-10-CM | POA: Diagnosis not present

## 2020-03-02 DIAGNOSIS — F430 Acute stress reaction: Secondary | ICD-10-CM | POA: Diagnosis not present

## 2020-03-02 DIAGNOSIS — J301 Allergic rhinitis due to pollen: Secondary | ICD-10-CM

## 2020-03-02 LAB — URINALYSIS
Bilirubin, UA: NEGATIVE
Glucose, UA: NEGATIVE
Ketones, UA: NEGATIVE
Leukocytes,UA: NEGATIVE
Nitrite, UA: NEGATIVE
Protein,UA: NEGATIVE
Specific Gravity, UA: 1.02 (ref 1.005–1.030)
Urobilinogen, Ur: 0.2 mg/dL (ref 0.2–1.0)
pH, UA: 6 (ref 5.0–7.5)

## 2020-03-02 MED ORDER — AZELASTINE HCL 0.1 % NA SOLN: 1.0000 | Freq: Two times a day (BID) | NASAL | 12 refills | Status: DC

## 2020-03-02 MED ORDER — BENZONATATE 200 MG PO CAPS: 200.0000 mg | ORAL_CAPSULE | Freq: Two times a day (BID) | ORAL | 0 refills | Status: DC | PRN

## 2020-03-02 NOTE — Patient Instructions (Signed)
You had labs performed today.  You will be contacted with the results of the labs once they are available, usually in the next 3 business days for routine lab work.  If you have an active my chart account, they will be released to your MyChart.  If you prefer to have these labs released to you via telephone, please let us know.  If you had a pap smear or biopsy performed, expect to be contacted in about 7-10 days.   Taper from Lexapro as we discussed  Call to make an appt with psychiatrist  [facility]    [phone] [address]   [place], [place] Medication management, substance abuse, bipolar, grief, family, marriage, OCD, anxiety, PTSD Sees children / Accepts Medicaid

## 2020-03-02 NOTE — Progress Notes (Signed)
Subjective: CC: Follow-up stress, preop clearance PCP: Janora Norlander, DO KTG:[id number] Scott Rasmussen is a 54 y.o. male presenting to clinic today for:  1.  Stress response Patient reports ongoing anxiety and panic.  He has intermittent forgetfulness.  He lowered the Lexapro to 5 mg because he was feeling excessive daytime sedation from the medicine.  This has helped some.  He is continued on the Wellbutrin.  Continues to have a low sex drive.  He is seeing the counselor every 2 weeks and feels that this is going well.  He would like to be referred to psychiatry for further management given ongoing symptoms.  Previously has been seen by [facility] in the past  2. Pt is a 54 y.o. male who is here for preoperative clearance for repair of his hallux rigidus of the right foot.  1) High Risk Cardiac Conditions  1) Recent MI - No.  2) Decompensated Heart Failure - No.  3) Unstable angina - No.  4) Symptomatic arrythmia - No.  5) Sx Valvular Disease - No.  2) Intermediate Risk Factors - CAD - Yes.    2) Functional Status - > 4 mets (Walk, run, climb stairs) Yes.  Scott Kitchen   3) Surgery Specific Risk - Intermediate (Carotid, Head and Neck, Orthopaedic )        4) Further Noninvasive evaluation -   1) EKG - Yes.     1) Hx of CVA, CAD, DM, CKD  2) Echo - No.   1) Worsening dyspnea   3) Stress Testing - Active Cardiac Disease - No. ROS: Per HPI  Allergies  Allergen Reactions  . Depo-Medrol [Methylprednisolone Sodium Succ] Rash   Past Medical History:  Diagnosis Date  . Gout   . Hallux rigidus 10/11/2018   right   . Hyperlipidemia   . Shortness of breath   . Sleep apnea    had sx   . Umbilical hernia     Current Outpatient Medications:  .  benzonatate (TESSALON) 200 MG capsule, Take 1 capsule (200 mg total) by mouth 2 (two) times daily as needed for cough., Disp: 20 capsule, Rfl: 0 .  buPROPion (WELLBUTRIN XL) 150 MG 24 hr tablet, Take 1 tablet (150 mg total) by mouth daily.,  Disp: 90 tablet, Rfl: 0 .  escitalopram (LEXAPRO) 10 MG tablet, Take 1 tablet (10 mg total) by mouth daily., Disp: 90 tablet, Rfl: 0 .  famotidine (PEPCID) 20 MG tablet, Take 1 tablet (20 mg total) by mouth at bedtime. For acid reflux, Disp: 90 tablet, Rfl: 0 .  febuxostat (ULORIC) 40 MG tablet, Take 1 tablet (40 mg total) by mouth daily., Disp: 90 tablet, Rfl: 3 .  fluticasone (FLONASE) 50 MCG/ACT nasal spray, Place 2 sprays into both nostrils daily., Disp: 48 g, Rfl: 3 .  hydrOXYzine (ATARAX/VISTARIL) 50 MG tablet, Take 1/2 to 1 tablet (25-50 mg total) by mouth every 8 (eight) hours as needed for anxiety (sleep)., Disp: 30 tablet, Rfl: 0 .  icosapent Ethyl (VASCEPA) 1 g capsule, Take 2 capsules (2 g total) by mouth 2 (two) times daily., Disp: 360 capsule, Rfl: 3 .  loratadine (CLARITIN) 10 MG tablet, Take 1 tablet (10 mg total) by mouth daily., Disp: 90 tablet, Rfl: 3 .  meloxicam (MOBIC) 15 MG tablet, Take 1 tablet (15 mg total) by mouth daily., Disp: 30 tablet, Rfl: 1 .  omeprazole (PRILOSEC) 40 MG capsule, Take 1 capsule (40 mg total) by mouth daily., Disp: 90 capsule, Rfl: 3 .  rosuvastatin (CRESTOR) 40 MG tablet, Take 40 mg by mouth daily., Disp: , Rfl:  .  sodium chloride (OCEAN) 0.65 % SOLN nasal spray, Place 1 spray into both nostrils as needed for congestion., Disp: 60 mL, Rfl: 0 .  Vitamin D, Ergocalciferol, (DRISDOL) 1.25 MG (50000 UNIT) CAPS capsule, Take 1 capsule (50,000 Units total) by mouth every 7 (seven) days., Disp: 12 capsule, Rfl: 3 .  metoprolol tartrate (LOPRESSOR) 25 MG tablet, Take 1 tablet (25 mg total) by mouth 2 (two) times daily. (Patient taking differently: Take 25 mg by mouth daily. ), Disp: 60 tablet, Rfl: 0 Social History   Socioeconomic History  . Marital status: Married    Spouse name: Not on file  . Number of children: Not on file  . Years of education: Not on file  . Highest education level: Not on file  Occupational History  . Not on file  Tobacco Use   . Smoking status: Former Smoker    Packs/day: 1.00    Years: 15.00    Pack years: 15.00    Types: Cigarettes    Quit date: 2012    Years since quitting: 9.8  . Smokeless tobacco: Never Used  Vaping Use  . Vaping Use: Never used  Substance and Sexual Activity  . Alcohol use: Yes    Comment: 2 drinks per month per pt.  . Drug use: Not Currently    Types: Marijuana  . Sexual activity: Not on file  Other Topics Concern  . Not on file  Social History Narrative   Works at [employer] Strain:   . Difficulty of Paying Living Expenses: Not on file  Food Insecurity:   . Worried About Charity fundraiser in the Last Year: Not on file  . Ran Out of Food in the Last Year: Not on file  Transportation Needs:   . Lack of Transportation (Medical): Not on file  . Lack of Transportation (Non-Medical): Not on file  Physical Activity:   . Days of Exercise per Week: Not on file  . Minutes of Exercise per Session: Not on file  Stress:   . Feeling of Stress : Not on file  Social Connections:   . Frequency of Communication with Friends and Family: Not on file  . Frequency of Social Gatherings with Friends and Family: Not on file  . Attends Religious Services: Not on file  . Active Member of Clubs or Organizations: Not on file  . Attends Archivist Meetings: Not on file  . Marital Status: Not on file  Intimate Partner Violence:   . Fear of Current or Ex-Partner: Not on file  . Emotionally Abused: Not on file  . Physically Abused: Not on file  . Sexually Abused: Not on file   Family History  Problem Relation Age of Onset  . Healthy Mother   . Heart attack Father   . Colon cancer Neg Hx   . Esophageal cancer Neg Hx   . Rectal cancer Neg Hx   . Stomach cancer Neg Hx     Objective: Office vital signs reviewed. BP 105/66   Pulse 76   Temp (!) 96.5 F (35.8 C)   Ht 5' 10.5" (1.791 m)   Wt 232 lb 12.8 oz (105.6  kg)   SpO2 96%   BMI 32.93 kg/m   Physical Examination:  General: Awake, alert, well nourished, No acute distress HEENT: Normal; Mallampati 1.  Nasal turbinates with mild erythema and clear drainage. Cardio: regular rate and rhythm, S1S2 heard, no murmurs appreciated Pulm: clear to auscultation bilaterally, no wheezes, rhonchi or rales; normal work of breathing on room air Extremities: warm, well perfused, No edema, cyanosis or clubbing; +2 pulses bilaterally MSK: Ambulating independently.  Normal tone.  Has full extension of the C-spine  Assessment/ Plan: 54 y.o. male   1. Pre-op exam I have independently evaluated patient.  Scott Rasmussen is a 54 y.o. male who is low risk for an intermediate risk surgery.  There are modifiable risk factors (smoking). Scott Rasmussen's RCRIcalculation for MACE is: 0 (0.3%).  Will CC EKG to cards. - EKG 12-Lead - CMP14+EGFR - CBC - DG Chest 2 View; Future - Urinalysis  2. Acquired hallux rigidus of right foot  3. Acute stress reaction Stable.  Referral to psychiatry placed - Ambulatory referral to Psychiatry  4. Tobacco use Chest x-ray obtained - DG Chest 2 View; Future  5. Cough Intermittent likely triggered by postnasal drip. - benzonatate (TESSALON) 200 MG capsule; Take 1 capsule (200 mg total) by mouth 2 (two) times daily as needed for cough.  Dispense: 20 capsule; Refill: 0  6. Seasonal allergic rhinitis due to pollen Astelin added to help with drainage.  No evidence of bacterial infection on exam - azelastine (ASTELIN) 0.1 % nasal spray; Place 1 spray into both nostrils 2 (two) times daily.  Dispense: 30 mL; Refill: 32  Scott Rasmussen M. Lajuana Ripple, [facility]

## 2020-03-03 LAB — CMP14+EGFR
ALT: 32 IU/L (ref 0–44)
AST: 23 IU/L (ref 0–40)
Albumin/Globulin Ratio: 2.3 — ABNORMAL HIGH (ref 1.2–2.2)
Albumin: 4.8 g/dL (ref 3.8–4.9)
Alkaline Phosphatase: 69 IU/L (ref 44–121)
BUN/Creatinine Ratio: 14 (ref 9–20)
BUN: 16 mg/dL (ref 6–24)
Bilirubin Total: 0.5 mg/dL (ref 0.0–1.2)
CO2: 23 mmol/L (ref 20–29)
Calcium: 10.2 mg/dL (ref 8.7–10.2)
Chloride: 105 mmol/L (ref 96–106)
Creatinine, Ser: 1.17 mg/dL (ref 0.76–1.27)
GFR calc Af Amer: 82 mL/min/{1.73_m2} (ref >59)
GFR calc non Af Amer: 71 mL/min/{1.73_m2} (ref >59)
Globulin, Total: 2.1 g/dL (ref 1.5–4.5)
Glucose: 84 mg/dL (ref 65–99)
Potassium: 4.8 mmol/L (ref 3.5–5.2)
Sodium: 142 mmol/L (ref 134–144)
Total Protein: 6.9 g/dL (ref 6.0–8.5)

## 2020-03-03 LAB — CBC
Hematocrit: 51.3 % — ABNORMAL HIGH (ref 37.5–51.0)
Hemoglobin: 18 g/dL — ABNORMAL HIGH (ref 13.0–17.7)
MCH: 29.7 pg (ref 26.6–33.0)
MCHC: 35.1 g/dL (ref 31.5–35.7)
MCV: 85 fL (ref 79–97)
Platelets: 293 10*3/uL (ref 150–450)
RBC: 6.07 x10E6/uL — ABNORMAL HIGH (ref 4.14–5.80)
RDW: 12.7 % (ref 11.6–15.4)
WBC: 6.5 10*3/uL (ref 3.4–10.8)

## 2020-03-05 ENCOUNTER — Other Ambulatory Visit: Payer: Self-pay

## 2020-03-05 DIAGNOSIS — R8290 Unspecified abnormal findings in urine: Secondary | ICD-10-CM

## 2020-03-08 DIAGNOSIS — M67824 Other specified disorders of tendon, left elbow: Secondary | ICD-10-CM | POA: Diagnosis not present

## 2020-03-08 DIAGNOSIS — M19041 Primary osteoarthritis, right hand: Secondary | ICD-10-CM | POA: Diagnosis not present

## 2020-03-08 DIAGNOSIS — M67823 Other specified disorders of tendon, right elbow: Secondary | ICD-10-CM | POA: Diagnosis not present

## 2020-03-08 DIAGNOSIS — M19042 Primary osteoarthritis, left hand: Secondary | ICD-10-CM | POA: Diagnosis not present

## 2020-03-13 ENCOUNTER — Encounter: Payer: Self-pay | Admitting: Family Medicine

## 2020-03-20 DIAGNOSIS — M2021 Hallux rigidus, right foot: Secondary | ICD-10-CM | POA: Diagnosis not present

## 2020-03-20 DIAGNOSIS — G8918 Other acute postprocedural pain: Secondary | ICD-10-CM | POA: Diagnosis not present

## 2020-04-06 ENCOUNTER — Other Ambulatory Visit: Payer: Self-pay

## 2020-04-06 ENCOUNTER — Ambulatory Visit (INDEPENDENT_AMBULATORY_CARE_PROVIDER_SITE_OTHER): Payer: BC Managed Care – PPO | Admitting: Family Medicine

## 2020-04-06 VITALS — BP 137/89 | HR 73 | Temp 98.1°F | Ht 70.5 in | Wt 243.0 lb

## 2020-04-06 DIAGNOSIS — R059 Cough, unspecified: Secondary | ICD-10-CM

## 2020-04-06 DIAGNOSIS — Z720 Tobacco use: Secondary | ICD-10-CM

## 2020-04-06 DIAGNOSIS — F4310 Post-traumatic stress disorder, unspecified: Secondary | ICD-10-CM | POA: Diagnosis not present

## 2020-04-06 DIAGNOSIS — F515 Nightmare disorder: Secondary | ICD-10-CM

## 2020-04-06 MED ORDER — PRAZOSIN HCL 1 MG PO CAPS: 1.0000 mg | ORAL_CAPSULE | Freq: Every day | ORAL | 0 refills | Status: DC

## 2020-04-06 MED ORDER — BENZONATATE 200 MG PO CAPS: 200.0000 mg | ORAL_CAPSULE | Freq: Two times a day (BID) | ORAL | 0 refills | Status: DC | PRN

## 2020-04-06 MED ORDER — BREO ELLIPTA 100-25 MCG/INH IN AEPB: 1.0000 | INHALATION_SPRAY | Freq: Every day | RESPIRATORY_TRACT | 0 refills | Status: DC

## 2020-04-06 NOTE — Progress Notes (Signed)
Subjective: CC: PTSD PCP: [name], DO Scott Rasmussen is a 54 y.o. male presenting to clinic today for:  1. PTSD Patient was seen in November for stress response, now consider PTSD.  At that time he was having ongoing anxiety and panic.  He was experiencing forgetfulness.  He had lowered his Lexapro to 5 mg because of excessive daytime sedation from the medication but continue Wellbutrin at the scheduled dose.  He was referred to psychiatry.  He reports that he continues to have nightmares every night and vivid dreams on the nights that he does not have nightmares.  He reports feeling fatigued as a result.  He continues to have little to no sexual drive despite stopping Lexapro.  This seems to have been very consistent with the timing of the event.  His appetite is good.  He was not able to get a doctor at [facility] but is going to be seeing a physician at [facility] on 05/22/2020 to establish care for psychiatric assistance.  He continues to speak to his therapist, Sam, but does admit that he does need more intensive therapy and treatment than what Sam can provide. He is compliant with the Wellbutrin.  He still does not feel that he would be able to return to work to perform work duties due to the anxiety and panic he experiences.  He is recovering well from his foot surgery.  2.  Cough Patient has ongoing cough that seems to be worse at nighttime.  No significant drainage, nasal congestion.  He does report some intermittent wheezing.  He does smoke.  Though he feels that he smokes less than previous.  No hemoptysis or night sweats.  ROS: Per HPI  Allergies  Allergen Reactions   Depo-Medrol [Methylprednisolone Sodium Succ] Rash   Past Medical History:  Diagnosis Date   Gout    Hallux rigidus 10/11/2018   right    Hyperlipidemia    Shortness of breath    Sleep apnea    had sx    Umbilical hernia     Current Outpatient Medications:    azelastine  (ASTELIN) 0.1 % nasal spray, Place 1 spray into both nostrils 2 (two) times daily., Disp: 30 mL, Rfl: 12   benzonatate (TESSALON) 200 MG capsule, Take 1 capsule (200 mg total) by mouth 2 (two) times daily as needed for cough., Disp: 20 capsule, Rfl: 0   buPROPion (WELLBUTRIN XL) 150 MG 24 hr tablet, Take 1 tablet (150 mg total) by mouth daily., Disp: 90 tablet, Rfl: 0   famotidine (PEPCID) 20 MG tablet, Take 1 tablet (20 mg total) by mouth at bedtime. For acid reflux, Disp: 90 tablet, Rfl: 0   febuxostat (ULORIC) 40 MG tablet, Take 1 tablet (40 mg total) by mouth daily., Disp: 90 tablet, Rfl: 3   fluticasone (FLONASE) 50 MCG/ACT nasal spray, Place 2 sprays into both nostrils daily., Disp: 48 g, Rfl: 3   hydrOXYzine (ATARAX/VISTARIL) 50 MG tablet, Take 1/2 to 1 tablet (25-50 mg total) by mouth every 8 (eight) hours as needed for anxiety (sleep)., Disp: 30 tablet, Rfl: 0   icosapent Ethyl (VASCEPA) 1 g capsule, Take 2 capsules (2 g total) by mouth 2 (two) times daily., Disp: 360 capsule, Rfl: 3   loratadine (CLARITIN) 10 MG tablet, Take 1 tablet (10 mg total) by mouth daily., Disp: 90 tablet, Rfl: 3   meloxicam (MOBIC) 15 MG tablet, Take 1 tablet (15 mg total) by mouth daily., Disp: 30 tablet,  Rfl: 1   metoprolol tartrate (LOPRESSOR) 25 MG tablet, Take 1 tablet (25 mg total) by mouth 2 (two) times daily. (Patient taking differently: Take 25 mg by mouth daily. ), Disp: 60 tablet, Rfl: 0   omeprazole (PRILOSEC) 40 MG capsule, Take 1 capsule (40 mg total) by mouth daily., Disp: 90 capsule, Rfl: 3   rosuvastatin (CRESTOR) 40 MG tablet, Take 40 mg by mouth daily., Disp: , Rfl:    sodium chloride (OCEAN) 0.65 % SOLN nasal spray, Place 1 spray into both nostrils as needed for congestion., Disp: 60 mL, Rfl: 0   Vitamin D, Ergocalciferol, (DRISDOL) 1.25 MG (50000 UNIT) CAPS capsule, Take 1 capsule (50,000 Units total) by mouth every 7 (seven) days., Disp: 12 capsule, Rfl: 3 Social History    Socioeconomic History   Marital status: Married    Spouse name: Not on file   Number of children: Not on file   Years of education: Not on file   Highest education level: Not on file  Occupational History   Not on file  Tobacco Use   Smoking status: Former Smoker    Packs/day: 1.00    Years: 15.00    Pack years: 15.00    Types: Cigarettes    Quit date: 2012    Years since quitting: 9.9   Smokeless tobacco: Never Used  Building services engineer Use: Never used  Substance and Sexual Activity   Alcohol use: Yes    Comment: 2 drinks per month per pt.   Drug use: Not Currently    Types: Marijuana   Sexual activity: Not on file  Other Topics Concern   Not on file  Social History Narrative   Works at [employer] investment banker Strain: Not on [employer] Insecurity: Not on file  Transportation Needs: Not on file  Physical Activity: Not on file  Stress: Not on file  Social Connections: Not on file  Intimate Partner Violence: Not on file   Family History  Problem Relation Age of Onset   Healthy Mother    Heart attack Father    Colon cancer Neg Hx    Esophageal cancer Neg Hx    Rectal cancer Neg Hx    Stomach cancer Neg Hx     Objective: Office vital signs reviewed. BP 137/89    Pulse 73    Temp 98.1 F (36.7 C) (Temporal)    Ht 5' 10.5" (1.791 m)    Wt 243 lb (110.2 kg)    SpO2 98%    BMI 34.37 kg/m   Physical Examination:  General: Awake, alert, No acute distress HEENT: Normal; sclera white, MMM Cardio: regular rate and rhythm, S1S2 heard, no murmurs appreciated Pulm: clear to auscultation bilaterally, no wheezes, rhonchi or rales; normal work of breathing on room air MSK: normal gait and station Psych: Mood is stable, speech is normal, affect is appropriate.  Good eye contact.  Thought process linear.  Does not appear to be responding to internal stimuli Depression screen [facility] 2/9 04/06/2020 03/02/2020  02/02/2020  Decreased Interest 0 1 1  Down, Depressed, Hopeless 0 0 0  PHQ - 2 Score 0 1 1  Altered sleeping 0 3 2  Tired, decreased energy 0 3 3  Change in appetite 0 0 3  Feeling bad or failure about yourself  0 0 0  Trouble concentrating 0 1 0  Moving slowly or fidgety/restless 0 0 1  Suicidal thoughts  0 0 0  PHQ-9 Score 0 8 10  Difficult doing work/chores - Very difficult Very difficult  Some recent data might be hidden   GAD 7 : Generalized Anxiety Score 04/06/2020 03/02/2020 02/02/2020 01/17/2020  Nervous, Anxious, on Edge 1 2 0 3  Control/stop worrying 0 3 2 3   Worry too much - different things 0 3 1 3   Trouble relaxing 2 3 0 3  Restless 0 0 1 2  Easily annoyed or irritable 0 0 0 0  Afraid - awful might happen 0 0 0 0  Total GAD 7 Score 3 11 4 14   Anxiety Difficulty - Somewhat difficult Somewhat difficult Very difficult   Assessment/ Plan: 54 y.o. male   1. PTSD (post-traumatic stress disorder) Ongoing stress but symptoms do seem to be slightly better than his check last visit.  I am going to add Minipress at bedtime to see if this might help with the nightmares.  I cautioned him with potential impact on blood pressure but his blood pressure is on the higher end of normal so I think he should be okay.  I would like to follow-up with him in the next 2 months for recheck, sooner if needed. - prazosin (MINIPRESS) 1 MG capsule; Take 1 capsule (1 mg total) by mouth at bedtime.  Dispense: 90 capsule; Refill: 0  2. Nightmares As above - prazosin (MINIPRESS) 1 MG capsule; Take 1 capsule (1 mg total) by mouth at bedtime.  Dispense: 90 capsule; Refill: 0  3. Tobacco use - fluticasone furoate-vilanterol (BREO ELLIPTA) 100-25 MCG/INH AEPB; Inhale 1 puff into the lungs daily.  Dispense: 1 each; Refill: 0  4. Cough I think this is likely undiagnosed COPD that is not controlled.  Previously responded to [facility] and I gave him a sample of this today.  He will follow-up with me and let me  know if this improves his symptoms.  Encouraged him to wash his mouth out each time he uses this.  Tessalon Perles were also renewed.  May need to consider formal PFTs at some point. - fluticasone furoate-vilanterol (BREO ELLIPTA) 100-25 MCG/INH AEPB; Inhale 1 puff into the lungs daily.  Dispense: 1 each; Refill: 0 - benzonatate (TESSALON) 200 MG capsule; Take 1 capsule (200 mg total) by mouth 2 (two) times daily as needed for cough.  Dispense: 20 capsule; Refill: 0   No orders of the defined types were placed in this encounter.  No orders of the defined types were placed in this encounter.    , DO [facility] [phone]

## 2020-04-12 DIAGNOSIS — M67824 Other specified disorders of tendon, left elbow: Secondary | ICD-10-CM | POA: Diagnosis not present

## 2020-04-16 ENCOUNTER — Telehealth: Payer: Self-pay

## 2020-04-16 NOTE — Telephone Encounter (Signed)
Pulled form dated 03/26/20 to place on Dr. Delynn Flavin desk to have updated and faxed.

## 2020-04-23 DIAGNOSIS — R311 Benign essential microscopic hematuria: Secondary | ICD-10-CM | POA: Diagnosis not present

## 2020-04-24 NOTE — Telephone Encounter (Signed)
Form updated & faxed

## 2020-04-25 DIAGNOSIS — F410 Panic disorder [episodic paroxysmal anxiety] without agoraphobia: Secondary | ICD-10-CM | POA: Diagnosis not present

## 2020-04-25 DIAGNOSIS — F5105 Insomnia due to other mental disorder: Secondary | ICD-10-CM | POA: Diagnosis not present

## 2020-04-25 DIAGNOSIS — F4311 Post-traumatic stress disorder, acute: Secondary | ICD-10-CM | POA: Diagnosis not present

## 2020-04-30 DIAGNOSIS — M79671 Pain in right foot: Secondary | ICD-10-CM | POA: Diagnosis not present

## 2020-05-01 DIAGNOSIS — M67823 Other specified disorders of tendon, right elbow: Secondary | ICD-10-CM | POA: Diagnosis not present

## 2020-05-01 DIAGNOSIS — M67824 Other specified disorders of tendon, left elbow: Secondary | ICD-10-CM | POA: Diagnosis not present

## 2020-05-02 ENCOUNTER — Other Ambulatory Visit: Payer: Self-pay

## 2020-05-02 ENCOUNTER — Ambulatory Visit (INDEPENDENT_AMBULATORY_CARE_PROVIDER_SITE_OTHER): Payer: BC Managed Care – PPO

## 2020-05-02 DIAGNOSIS — Z23 Encounter for immunization: Secondary | ICD-10-CM | POA: Diagnosis not present

## 2020-05-02 NOTE — Progress Notes (Signed)
   Covid-19 Vaccination Clinic  Name:  Scott Rasmussen    MRN: [id number] DOB: [birth year]  05/02/2020  [name] was observed post Covid-19 immunization for 15 minutes without incident. He was provided with Vaccine Information Sheet and instruction to access the V-Safe system.   [name] was instructed to call 911 with any severe reactions post vaccine: Marland Kitchen Difficulty breathing  . Swelling of face and throat  . A fast heartbeat  . A bad rash all over body  . Dizziness and weakness   Immunizations Administered    Name Date Dose VIS Date Route   Pfizer COVID-19 Vaccine 05/02/2020 11:10 AM 0.3 mL 02/08/2020 Intramuscular   Manufacturer: [employer], [employer]   Lot: [id number]   NDC: [address]-6

## 2020-05-10 DIAGNOSIS — F410 Panic disorder [episodic paroxysmal anxiety] without agoraphobia: Secondary | ICD-10-CM | POA: Diagnosis not present

## 2020-05-10 DIAGNOSIS — F5105 Insomnia due to other mental disorder: Secondary | ICD-10-CM | POA: Diagnosis not present

## 2020-05-10 DIAGNOSIS — F4311 Post-traumatic stress disorder, acute: Secondary | ICD-10-CM | POA: Diagnosis not present

## 2020-05-11 ENCOUNTER — Other Ambulatory Visit: Payer: Self-pay | Admitting: Cardiovascular Disease

## 2020-05-28 ENCOUNTER — Ambulatory Visit: Payer: BC Managed Care – PPO | Admitting: Family Medicine

## 2020-05-28 ENCOUNTER — Encounter: Payer: Self-pay | Admitting: Family Medicine

## 2020-05-28 ENCOUNTER — Other Ambulatory Visit: Payer: Self-pay

## 2020-05-28 VITALS — BP 147/93 | HR 63 | Temp 97.4°F | Ht 70.5 in | Wt 243.0 lb

## 2020-05-28 DIAGNOSIS — N521 Erectile dysfunction due to diseases classified elsewhere: Secondary | ICD-10-CM

## 2020-05-28 DIAGNOSIS — M2021 Hallux rigidus, right foot: Secondary | ICD-10-CM | POA: Diagnosis not present

## 2020-05-28 DIAGNOSIS — R918 Other nonspecific abnormal finding of lung field: Secondary | ICD-10-CM | POA: Diagnosis not present

## 2020-05-28 DIAGNOSIS — Z4889 Encounter for other specified surgical aftercare: Secondary | ICD-10-CM | POA: Diagnosis not present

## 2020-05-28 DIAGNOSIS — R251 Tremor, unspecified: Secondary | ICD-10-CM

## 2020-05-28 DIAGNOSIS — E538 Deficiency of other specified B group vitamins: Secondary | ICD-10-CM

## 2020-05-28 DIAGNOSIS — R413 Other amnesia: Secondary | ICD-10-CM

## 2020-05-28 DIAGNOSIS — F4310 Post-traumatic stress disorder, unspecified: Secondary | ICD-10-CM

## 2020-05-28 DIAGNOSIS — F418 Other specified anxiety disorders: Secondary | ICD-10-CM

## 2020-05-28 MED ORDER — SILDENAFIL CITRATE 100 MG PO TABS: 50.0000 mg | ORAL_TABLET | Freq: Every day | ORAL | 11 refills | Status: DC | PRN

## 2020-05-28 MED ORDER — CYANOCOBALAMIN 1000 MCG/ML IJ SOLN
1000.0000 ug | INTRAMUSCULAR | Status: AC
  Administered 2020-05-28: 1000 ug via INTRAMUSCULAR

## 2020-05-28 MED ORDER — LORAZEPAM 0.5 MG PO TABS: ORAL_TABLET | ORAL | 1 refills | Status: DC

## 2020-05-28 NOTE — Progress Notes (Signed)
Subjective: CC: PTSD PCP: Scott Ip, DO PRF:Scott Rasmussen is a 55 y.o. male presenting to clinic today for:  1.  PTSD/memory loss, tremor Patient continues to follow-up with his psychiatrist every 2 weeks.  His medications have been modified and have been updated within the system. He seems to be doing okay from this standpoint.  He has had some memory loss and tremor since the event in September and wonders if this could be an underlying, undiagnosed neurologic disorder.  Denies any family history of Alzheimer's or Parkinson disease.  Tremor seems to affect the right hand only.  Memory loss is primarily short-term.  Denies getting lost, forgetting his wife's name, etc.  2.  Erectile dysfunction Patient reports having erectile dysfunction since the event in September.  He has had at least 3 episodes where he was not able to achieve an erection.  He has had only one instance in which he was able to both achieve, maintain.  He is asking for trial of Viagra.  3.  Situational anxiety Patient is taking a flight to [place] in the next couple of days and is asking for an anxiolytic.  He has not experienced severe anxiety on a plane previously but now that he is undergoing significant stress since the event in September he worries that he will have a full-fledged panic attack.  ROS: Per HPI  Allergies  Allergen Reactions  . Depo-Medrol [Methylprednisolone Sodium Succ] Rash   Past Medical History:  Diagnosis Date  . Gout   . Hallux rigidus 10/11/2018   right   . Hyperlipidemia   . Shortness of breath   . Sleep apnea    had sx   . Umbilical hernia     Current Outpatient Medications:  .  benzonatate (TESSALON) 200 MG capsule, Take 1 capsule (200 mg total) by mouth 2 (two) times daily as needed for cough., Disp: 20 capsule, Rfl: 0 .  buPROPion (WELLBUTRIN XL) 150 MG 24 hr tablet, Take 1 tablet (150 mg total) by mouth daily., Disp: 90 tablet, Rfl: 0 .  famotidine (PEPCID) 20 MG  tablet, Take 1 tablet (20 mg total) by mouth at bedtime. For acid reflux, Disp: 90 tablet, Rfl: 0 .  febuxostat (ULORIC) 40 MG tablet, Take 1 tablet (40 mg total) by mouth daily., Disp: 90 tablet, Rfl: 3 .  fluticasone furoate-vilanterol (BREO ELLIPTA) 100-25 MCG/INH AEPB, Inhale 1 puff into the lungs daily., Disp: 1 each, Rfl: 0 .  hydrOXYzine (ATARAX/VISTARIL) 50 MG tablet, Take 1/2 to 1 tablet (25-50 mg total) by mouth every 8 (eight) hours as needed for anxiety (sleep)., Disp: 30 tablet, Rfl: 0 .  icosapent Ethyl (VASCEPA) 1 g capsule, Take 2 capsules (2 g total) by mouth 2 (two) times daily., Disp: 360 capsule, Rfl: 3 .  loratadine (CLARITIN) 10 MG tablet, Take 1 tablet (10 mg total) by mouth daily., Disp: 90 tablet, Rfl: 3 .  meloxicam (MOBIC) 15 MG tablet, Take 1 tablet (15 mg total) by mouth daily., Disp: 30 tablet, Rfl: 1 .  metoprolol tartrate (LOPRESSOR) 25 MG tablet, TAKE 1 TABLET TWICE A DAY, Disp: 180 tablet, Rfl: 3 .  omeprazole (PRILOSEC) 40 MG capsule, Take 1 capsule (40 mg total) by mouth daily., Disp: 90 capsule, Rfl: 3 .  prazosin (MINIPRESS) 1 MG capsule, Take 1 capsule (1 mg total) by mouth at bedtime., Disp: 90 capsule, Rfl: 0 .  rosuvastatin (CRESTOR) 40 MG tablet, Take 40 mg by mouth daily., Disp: , Rfl:  .  Vitamin D, Ergocalciferol, (DRISDOL) 1.25 MG (50000 UNIT) CAPS capsule, Take 1 capsule (50,000 Units total) by mouth every 7 (seven) days., Disp: 12 capsule, Rfl: 3 Social History   Socioeconomic History  . Marital status: Married    Spouse name: Not on file  . Number of children: Not on file  . Years of education: Not on file  . Highest education level: Not on file  Occupational History  . Not on file  Tobacco Use  . Smoking status: Former Smoker    Packs/day: 1.00    Years: 15.00    Pack years: 15.00    Types: Cigarettes    Quit date: 2012    Years since quitting: 10.1  . Smokeless tobacco: Never Used  Vaping Use  . Vaping Use: Never used  Substance and  Sexual Activity  . Alcohol use: Yes    Comment: 2 drinks per month per pt.  . Drug use: Not Currently    Types: Marijuana  . Sexual activity: Not on file  Other Topics Concern  . Not on file  Social History Narrative   Works at [employer] Strain: Not on [employer] Insecurity: Not on file  Transportation Needs: Not on file  Physical Activity: Not on file  Stress: Not on file  Social Connections: Not on file  Intimate Partner Violence: Not on file   Family History  Problem Relation Age of Onset  . Healthy Mother   . Heart attack Father   . Colon cancer Neg Hx   . Esophageal cancer Neg Hx   . Rectal cancer Neg Hx   . Stomach cancer Neg Hx     Objective: Office vital signs reviewed. BP (!) 147/93   Pulse 63   Temp (!) 97.4 F (36.3 C) (Temporal)   Ht 5' 10.5" (1.791 m)   Wt 243 lb (110.2 kg)   SpO2 97%   BMI 34.37 kg/m   Physical Examination:  General: Awake, alert, well nourished, No acute distress HEENT: Normal; sclera white.  Moist mucous membranes Cardio: regular rate and rhythm, S1S2 heard, no murmurs appreciated Pulm: clear to auscultation bilaterally, no wheezes, rhonchi or rales; normal work of breathing on room air Extremities: warm, well perfused, No edema, cyanosis or clubbing; +2 pulses bilaterally MSK: normal gait and station Skin: dry; intact; no rashes or lesions; normal temperature Neuro: very mild right hand tremor noted, no focal neurologic deficits Psych: mood stable, speech normal  Depression screen [facility] 2/9 05/28/2020 04/06/2020 03/02/2020  Decreased Interest 0 0 1  Down, Depressed, Hopeless 0 0 0  PHQ - 2 Score 0 0 1  Altered sleeping 0 0 3  Tired, decreased energy 0 0 3  Change in appetite 0 0 0  Feeling bad or failure about yourself  0 0 0  Trouble concentrating 0 0 1  Moving slowly or fidgety/restless 0 0 0  Suicidal thoughts 0 0 0  PHQ-9 Score 0 0 8  Difficult doing  work/chores - - Very difficult  Some recent data might be hidden   GAD 7 : Generalized Anxiety Score 05/28/2020 04/06/2020 03/02/2020 02/02/2020  Nervous, Anxious, on Edge 1 1 2  0  Control/stop worrying 1 0 3 2  Worry too much - different things 1 0 3 1  Trouble relaxing 0 2 3 0  Restless 0 0 0 1  Easily annoyed or irritable 0 0 0 0  Afraid - awful might happen  0 0 0 0  Total GAD 7 Score 3 3 11 4   Anxiety Difficulty - - Somewhat difficult Somewhat difficult      Assessment/ Plan: 55 y.o. male   PTSD (post-traumatic stress disorder)  Multiple lung nodules - Plan: CT CHEST NODULE FOLLOW UP LOW DOSE W/O  Vitamin B12 deficiency - Plan: cyanocobalamin ((VITAMIN B-12)) injection 1,000 mcg  Erectile dysfunction due to diseases classified elsewhere - Plan: sildenafil (VIAGRA) 100 MG tablet  Situational anxiety - Plan: LORazepam (ATIVAN) 0.5 MG tablet  Memory loss - Plan: Ambulatory referral to Neurology  Tremor - Plan: Ambulatory referral to Neurology  Continue to follow-up with psychiatry.  Repeat CT ordered given history of smoking  B12 injection was administered with patient's medication today Continue once weekly injection for the next 3 weeks then switch to once monthly injection.  Plan for repeat B12 level in 2 to 3 months  Erectile dysfunction will be treated with trial of Viagra.  Discussed the risks of the medication and red flag signs and symptoms.  He voiced good understanding  Ativan given for as needed use for flights.  He has a flight to [place] coming up and then another to [place] in the summertime.  He is use this sparingly and only as directed.  He will inform his psychiatrist of this addition for as needed use for flights  He is having some memory loss and I think this to be related to his underlying psychiatric event.  However, he wished to be evaluated by neurology given associated right hand tremor as well and this has been placed.  We discussed potential for  neuropsychiatry.  The Narcotic Database has been reviewed.  There were no red flags.    No orders of the defined types were placed in this encounter.  No orders of the defined types were placed in this encounter.    [place], DO [facility] [phone]
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
## 2020-05-31 ENCOUNTER — Other Ambulatory Visit: Payer: Self-pay

## 2020-05-31 ENCOUNTER — Ambulatory Visit: Payer: BC Managed Care – PPO | Admitting: Neurology

## 2020-05-31 ENCOUNTER — Encounter: Payer: Self-pay | Admitting: Neurology

## 2020-05-31 VITALS — BP 120/80 | HR 84 | Ht 70.0 in | Wt 244.0 lb

## 2020-05-31 DIAGNOSIS — R413 Other amnesia: Secondary | ICD-10-CM | POA: Diagnosis not present

## 2020-05-31 DIAGNOSIS — E538 Deficiency of other specified B group vitamins: Secondary | ICD-10-CM | POA: Diagnosis not present

## 2020-05-31 DIAGNOSIS — R251 Tremor, unspecified: Secondary | ICD-10-CM

## 2020-05-31 NOTE — Patient Instructions (Signed)
If you decide to proceed with neurocognitive testing in the future, the following is information about it:   Please bring someone with you to this appointment if possible, as it is helpful for the neuropsychologist to hear from both you and another adult who knows you well. Please bring eyeglasses and hearing aids if you wear them.    The evaluation will take approximately 3 hours and has two parts:   . The first part is a clinical interview with the neuropsychologist, Dr. Milbert Coulter or Dr. Roseanne Reno. During the interview, the neuropsychologist will speak with you and the individual you brought to the appointment.    . The second part of the evaluation is testing with the doctor's technician, aka psychometrician, Annabelle Harman or [employer]. During the testing, the technician will ask you to remember different types of material, solve problems, and answer some questionnaires. Your family member will not be present for this portion of the evaluation.   Please note: We have to reserve several hours of the neuropsychologist's time and the psychometrician's time for your evaluation appointment. As such, there is a No-Show fee of $100. If you are unable to attend any of your appointments, please contact our office as soon as possible to reschedule.  The physicians and staff at [facility] are committed to providing excellent care. You may receive a survey requesting feedback about your experience at our office. We strive to receive "very good" responses to the survey questions. If you feel that your experience would prevent you from giving the office a "very good " response, please contact our office to try to remedy the situation. We may be reached at [phone]. Thank you for taking the time out of your busy day to complete the survey.

## 2020-06-10 ENCOUNTER — Encounter: Payer: Self-pay | Admitting: Family Medicine

## 2020-06-12 ENCOUNTER — Other Ambulatory Visit: Payer: Self-pay | Admitting: *Deleted

## 2020-06-12 NOTE — Telephone Encounter (Signed)
erroneous

## 2020-06-13 ENCOUNTER — Other Ambulatory Visit: Payer: Self-pay | Admitting: *Deleted

## 2020-06-13 MED ORDER — "BD INTEGRA SYRINGE 25G X 1"" 3 ML MISC": 1 refills | Status: DC

## 2020-06-15 ENCOUNTER — Ambulatory Visit (HOSPITAL_COMMUNITY)
Admission: RE | Admit: 2020-06-15 | Discharge: 2020-06-15 | Disposition: A | Discharge status: Home / Self Care | Payer: BC Managed Care – PPO | Source: Ambulatory Visit | Attending: Family Medicine | Admitting: Family Medicine

## 2020-06-15 ENCOUNTER — Other Ambulatory Visit: Payer: Self-pay

## 2020-06-15 DIAGNOSIS — R918 Other nonspecific abnormal finding of lung field: Secondary | ICD-10-CM | POA: Diagnosis not present

## 2020-06-15 DIAGNOSIS — R911 Solitary pulmonary nodule: Secondary | ICD-10-CM | POA: Diagnosis not present

## 2020-06-15 DIAGNOSIS — R0600 Dyspnea, unspecified: Secondary | ICD-10-CM | POA: Diagnosis not present

## 2020-06-21 ENCOUNTER — Other Ambulatory Visit: Payer: Self-pay

## 2020-06-21 ENCOUNTER — Ambulatory Visit (INDEPENDENT_AMBULATORY_CARE_PROVIDER_SITE_OTHER): Payer: BC Managed Care – PPO

## 2020-06-21 ENCOUNTER — Ambulatory Visit (INDEPENDENT_AMBULATORY_CARE_PROVIDER_SITE_OTHER): Payer: BC Managed Care – PPO | Admitting: Family Medicine

## 2020-06-21 ENCOUNTER — Encounter: Payer: Self-pay | Admitting: Family Medicine

## 2020-06-21 VITALS — BP 129/79 | HR 88 | Temp 97.1°F | Ht 70.0 in | Wt 237.8 lb

## 2020-06-21 DIAGNOSIS — M19041 Primary osteoarthritis, right hand: Secondary | ICD-10-CM | POA: Diagnosis not present

## 2020-06-21 DIAGNOSIS — S60131A Contusion of right middle finger with damage to nail, initial encounter: Secondary | ICD-10-CM | POA: Diagnosis not present

## 2020-06-21 DIAGNOSIS — M79644 Pain in right finger(s): Secondary | ICD-10-CM | POA: Diagnosis not present

## 2020-06-21 DIAGNOSIS — S60141A Contusion of right ring finger with damage to nail, initial encounter: Secondary | ICD-10-CM | POA: Diagnosis not present

## 2020-06-21 NOTE — Progress Notes (Signed)
Subjective: CC: Finger injury PCP: [name], DO WUJ:[id number] K Eggenberger is a 55 y.o. male presenting to clinic today for:  1.  Finger injury Patient developed a hematoma underneath the nails on his right hand along the fourth and third digits.  He smashed his hand in a door yesterday.  He has had subsequent swelling, discoloration of the tips of his fingers.  Has been using an oral analgesic OTC with some relief but no resolution.  Here for evaluation and hopeful release of hematoma   ROS: Per HPI  Allergies  Allergen Reactions   Depo-Medrol [Methylprednisolone Sodium Succ] Rash   Past Medical History:  Diagnosis Date   Gout    Hallux rigidus 10/11/2018   right    HTN (hypertension)    Hyperlipidemia    Shortness of breath    Sleep apnea    had sx    Umbilical hernia     Current Outpatient Medications:    atorvastatin (LIPITOR) 10 MG tablet, Take 10 mg by mouth daily., Disp: , Rfl:    buPROPion (WELLBUTRIN SR) 100 MG 12 hr tablet, Take 100 mg by mouth 2 (two) times daily., Disp: , Rfl:    cyanocobalamin (,VITAMIN B-12,) 1000 MCG/ML injection, SMARTSIG:1 Injection IM Every 2 Weeks, Disp: , Rfl:    DULoxetine (CYMBALTA) 60 MG capsule, Take 60 mg by mouth daily., Disp: , Rfl:    famotidine (PEPCID) 20 MG tablet, Take 1 tablet (20 mg total) by mouth at bedtime. For acid reflux, Disp: 90 tablet, Rfl: 0   febuxostat (ULORIC) 40 MG tablet, Take 1 tablet (40 mg total) by mouth daily., Disp: 90 tablet, Rfl: 3   hydrOXYzine (ATARAX/VISTARIL) 50 MG tablet, Take 1/2 to 1 tablet (25-50 mg total) by mouth every 8 (eight) hours as needed for anxiety (sleep)., Disp: 30 tablet, Rfl: 0   icosapent Ethyl (VASCEPA) 1 g capsule, Take 2 capsules (2 g total) by mouth 2 (two) times daily., Disp: 360 capsule, Rfl: 3   loratadine (CLARITIN) 10 MG tablet, Take 1 tablet (10 mg total) by mouth daily., Disp: 90 tablet, Rfl: 3   LORazepam (ATIVAN) 0.5 MG tablet, Take 1 tablet 1  hour prior to flight. Repeat 1 time if needed., Disp: 10 tablet, Rfl: 1   metoprolol tartrate (LOPRESSOR) 25 MG tablet, TAKE 1 TABLET TWICE A DAY, Disp: 180 tablet, Rfl: 3   omeprazole (PRILOSEC) 40 MG capsule, Take 1 capsule (40 mg total) by mouth daily., Disp: 90 capsule, Rfl: 3   prazosin (MINIPRESS) 1 MG capsule, Take 1 capsule (1 mg total) by mouth at bedtime., Disp: 90 capsule, Rfl: 0   sildenafil (VIAGRA) 100 MG tablet, Take 0.5-1 tablets (50-100 mg total) by mouth daily as needed for erectile dysfunction., Disp: 5 tablet, Rfl: 11   SYRINGE-NEEDLE, DISP, 3 ML (BD INTEGRA SYRINGE) 25G X 1" 3 ML MISC, Use with B12 injections monthly, Disp: 12 each, Rfl: 1   Vitamin D, Ergocalciferol, (DRISDOL) 1.25 MG (50000 UNIT) CAPS capsule, Take 1 capsule (50,000 Units total) by mouth every 7 (seven) days., Disp: 12 capsule, Rfl: 3  Current Facility-Administered Medications:    cyanocobalamin ((VITAMIN B-12)) injection 1,000 mcg, 1,000 mcg, Intramuscular, Q30 days, Olaoluwa Grieder M, DO, 1,000 mcg at 05/28/20 1356 Social History   Socioeconomic History   Marital status: Married    Spouse name: Not on file   Number of children: Not on file   Years of education: Not on file   Highest education level: Not on  file  Occupational History   Not on file  Tobacco Use   Smoking status: Current Some Day Smoker    Packs/day: 1.00    Years: 15.00    Pack years: 15.00    Types: Cigarettes    Last attempt to quit: 2012    Years since quitting: 10.1   Smokeless tobacco: Never Used  Vaping Use   Vaping Use: Never used  Substance and Sexual Activity   Alcohol use: Yes    Comment: 2 drinks per month per pt.   Drug use: Not Currently    Types: Marijuana   Sexual activity: Not on file  Other Topics Concern   Not on file  Social History Narrative   Works at [employer] investment banker Strain: Not on [employer] Insecurity: Not on file   Transportation Needs: Not on file  Physical Activity: Not on file  Stress: Not on file  Social Connections: Not on file  Intimate Partner Violence: Not on file   Family History  Problem Relation Age of Onset   Healthy Mother    Heart attack Father    Healthy Daughter    Healthy Son    Colon cancer Neg Hx    Esophageal cancer Neg Hx    Rectal cancer Neg Hx    Stomach cancer Neg Hx     Objective: Office vital signs reviewed. BP 129/79    Pulse 88    Temp (!) 97.1 F (36.2 C) (Temporal)    Ht 5\' 10"  (1.778 m)    Wt 237 lb 12.8 oz (107.9 kg)    BMI 34.12 kg/m   Physical Examination:  General: Awake, alert, well nourished, No acute distress MSK: Moderate ecchymosis and inflammation noted along the pads of the fingers of the third and fourth digit on the right hand as well as appreciable subungual hematomas noted with each digit respectively.  No skin breakdown or palpable bony abnormalities.  Trephination of nail Written consent obtained.  After cleaning the target areas with alcohol swabs x2, a digital block with 1% epinephrine performed in right hand, digits #3 and 4. A hand-held electrocautery was used to bur a hole in the center of each of the nails of digits 3 and 4.  He had immediate release of blood and felt pressure relief.  Pressure was applied to this area and bleeding slowed.  He subsequent was bandaged and home care instructions were reviewed.  Follow-up as needed  DG Finger Middle Right  Result Date: 06/21/2020 CLINICAL DATA:  Right third digit pain EXAM: RIGHT MIDDLE FINGER 2+V COMPARISON:  None. FINDINGS: Frontal, oblique, lateral views of the right third digit are obtained. Portions of the distal tuft of the third distal phalanx are obscured by overlying text on the frontal view. No fracture, subluxation, or dislocation. Joint spaces are well preserved. Soft tissues are unremarkable. IMPRESSION: 1. Unremarkable right third digit. Electronically Signed   By:  08/21/2020 M.D.   On: 06/21/2020 15:31   DG Finger Ring Right  Result Date: 06/21/2020 CLINICAL DATA:  Right fourth digit pain EXAM: RIGHT RING FINGER 2+V COMPARISON:  None. FINDINGS: Frontal, oblique, and lateral views of the right fourth digit are obtained. No fracture, subluxation, or dislocation. Mild osteoarthritis of the fourth proximal interphalangeal joint. Soft tissues are unremarkable. IMPRESSION: 1. Mild osteoarthritis of the fourth PIP joint.  No acute fracture. Electronically Signed   By: 08/21/2020.D.  On: 06/21/2020 15:33    Assessment/ Plan: 55 y.o. male   Finger pain, right - Plan: DG Finger Middle Right, DG Finger Ring Right  I first confirmed that he had no fracture as I did not want to cause an open fracture with trephination today.  X-ray was obtained and they were negative for fracture.  He was treated with trephination using a portable cautery.  He had immediate relief of pressure and tolerated procedure without difficulty.  See above procedure note.  Ice, NSAID recommended.  Monitor for any signs and symptoms of infection or prolonged bleeding.  He will follow-up as needed  Orders Placed This Encounter  Procedures   DG Finger Middle Right    Standing Status:   Future    Number of Occurrences:   1    Standing Expiration Date:   07/22/2020    Order Specific Question:   Reason for Exam (SYMPTOM  OR DIAGNOSIS REQUIRED)    Answer:   pain of middle and ring fingers - right hand    Order Specific Question:   Preferred imaging location?    Answer:   Internal   DG Finger Ring Right    Standing Status:   Future    Number of Occurrences:   1    Standing Expiration Date:   07/22/2020    Order Specific Question:   Reason for Exam (SYMPTOM  OR DIAGNOSIS REQUIRED)    Answer:   pain of middle and ring fingers - right hand    Order Specific Question:   Preferred imaging location?    Answer:   Internal   No orders of the defined types were placed in this  encounter.    [name], DO [facility] [phone]

## 2020-06-21 NOTE — Patient Instructions (Signed)
Subungual Hematoma A subungual hematoma is a collection of blood under a fingernail or toenail. It can cause pain and a blue area under the nail. What are the causes? This condition is caused by an injury to a finger or toe that breaks a blood vessel under the nail. It can result from:  A hard, direct hit to a finger or toe (crush injury).  Pressure being put on a finger or toe over and over again, such as pressure on a toe from running. What are the signs or symptoms?  A blue or dark blue color under the nail.  Pain or throbbing in the injured area.   How is this treated? Treatment is often not needed for this condition. The pain often goes away in a few days, and the dark color under the nail will go away as the nail grows. If treatment is needed, your doctor may:  Do a procedure to drain the blood from under the nail. This may be done if you have a lot of pain or if a lot of blood collects under the nail.  Remove the nail. This may be done if there is a cut under the nail that needs stitches (sutures). Follow these instructions at home: Managing pain, stiffness, and swelling  If told, put ice on the area. ? Put ice in a plastic bag. ? Place a towel between your skin and the bag. ? Leave the ice on for 20 minutes, 2-3 times a day.  Raise (elevate) the injured finger or toe above the level of your heart while you are sitting or lying down. Doing this will help with pain and swelling.   Injury care  Follow instructions from your doctor about how to take care of your injury. Make sure you: ? Change any bandage (dressing) as told by your doctor. ? Wash your hands with soap and water before you change your bandage. If you cannot use soap and water, use hand sanitizer. ? Leave stitches (sutures) in place. You may have these if your doctor fixed a cut under the nail. The stitches may need to stay in place for 2 weeks or longer.  If part of your nail falls off, gently trim the rest of  the nail. General instructions  Take over-the-counter and prescription medicines only as told by your doctor.  Return to your normal activities as told by your doctor. Ask your doctor what activities are safe for you.  Keep all follow-up visits as told by your doctor. This is important. Contact a doctor if you have:  Pain that is not helped by medicine.  A fever.  Redness, swelling, or pain around your nail. Get help right away if you have:  Fluid, blood, or pus coming from your nail. Summary  A subungual hematoma is a collection of blood under a fingernail or toenail.  It can cause pain and a blue area under the nail.  Treatment is often not needed for this condition.  Raise the injured finger or toe above the level of your heart while you are sitting or lying down. This information is not intended to replace advice given to you by your health care provider. Make sure you discuss any questions you have with your health care provider. Document Revised: 09/10/2017 Document Reviewed: 09/10/2017 Elsevier Patient Education  2021 [employer].

## 2020-06-27 ENCOUNTER — Encounter: Payer: Self-pay | Admitting: Family Medicine

## 2020-07-02 NOTE — Progress Notes (Signed)
Cardiology Office Note:   Date:  07/03/2020  NAME:  Scott HeidelbergWilliam K Pott    MRN: [id number] DOB:  Jun 22, 1965   PCP:  Raliegh IpGottschalk, Ashly M, DO  Cardiologist:  Reatha HarpsWesley T O'Neal, MD   Referring MD: Raliegh IpGottschalk, Ashly M, DO   Chief Complaint  Patient presents with  . Follow-up   History of Present Illness:   Scott Rasmussen is a 55 y.o. male with a hx of CAD, HLD who presents for follow-up. Had intermediate risk myoview but no symptoms of angina.  He reports overall he is doing well.  His lipid profile last summer shows his triglycerides were elevated again.  He is on Crestor 10 mg a day and Vascepa.  We do need to update this in our system.  He reports no chest pain or shortness of breath.  Overall I suspect his stress test was a false positive.  He has no symptoms of this.  Echocardiogram shows normal LV function.  He apparently is out of work.  Had an accident.  Had some PTSD and is now on occupational disability.  Blood pressure seems to be well controlled.  I had noticed that his hemoglobin values have increased.  He did have sleep apnea in the past but had nasal surgery.  He does snore and is fatigued.  I suspect he needs a repeat sleep study.  He is okay to do this.  He reports he was weaned off the mask nearly 15 years ago.  I suspect it has returned.  Overall appears to be doing well denies any chest pain or trouble breathing.  He is fasting for lipid profile.  Problem List 1. Covid-19 PNA 2. CAD (mild ischemia in basal to mid anterior wall, intermediate risk, NM SPECT 03/15/2019) -CAC score 0 2015 3. HLD  -T chol 104, HDL 42, LDL 48, TG 66 4. Former tobacco abuse 5. OSA -nasal surgery 6. HTN  Past Medical History: Past Medical History:  Diagnosis Date  . Gout   . Hallux rigidus 10/11/2018   right   . HTN (hypertension)   . Hyperlipidemia   . Shortness of breath   . Sleep apnea    had sx   . Umbilical hernia     Past Surgical History: Past Surgical History:  Procedure  Laterality Date  . TONSILLECTOMY AND ADENOIDECTOMY  2008  . UVULOPALATOPHARYNGOPLASTY  2008    Current Medications: Current Meds  Medication Sig  . buPROPion (WELLBUTRIN SR) 100 MG 12 hr tablet Take 100 mg by mouth 2 (two) times daily.  . Coenzyme Q10 (COQ-10) 100 MG CAPS Take 100 mg by mouth. 1 Capsule Daily  . DULoxetine (CYMBALTA) 60 MG capsule Take 60 mg by mouth daily.  . febuxostat (ULORIC) 40 MG tablet Take 1 tablet (40 mg total) by mouth daily.  . metoprolol tartrate (LOPRESSOR) 25 MG tablet TAKE 1 TABLET TWICE A DAY  . omeprazole (PRILOSEC) 40 MG capsule Take 1 capsule (40 mg total) by mouth daily.  . prazosin (MINIPRESS) 1 MG capsule Take 1 capsule (1 mg total) by mouth at bedtime.  . rosuvastatin (CRESTOR) 10 MG tablet Take 1 tablet (10 mg total) by mouth daily.  . sildenafil (VIAGRA) 100 MG tablet Take 0.5-1 tablets (50-100 mg total) by mouth daily as needed for erectile dysfunction.  . SYRINGE-NEEDLE, DISP, 3 ML (BD INTEGRA SYRINGE) 25G X 1" 3 ML MISC Use with B12 injections monthly  . Vitamin D, Ergocalciferol, (DRISDOL) 1.25 MG (50000 UNIT) CAPS capsule Take 1 capsule (  50,000 Units total) by mouth every 7 (seven) days.  . [DISCONTINUED] atorvastatin (LIPITOR) 10 MG tablet Take 10 mg by mouth daily.  . [DISCONTINUED] icosapent Ethyl (VASCEPA) 1 g capsule Take 2 capsules (2 g total) by mouth 2 (two) times daily.   Current Facility-Administered Medications for the 07/03/20 encounter (Office Visit) with Sande Rives, MD  Medication  . cyanocobalamin ((VITAMIN B-12)) injection 1,000 mcg     Allergies:    Depo-medrol [methylprednisolone sodium succ]   Social History: Social History   Socioeconomic History  . Marital status: Married    Spouse name: Not on file  . Number of children: Not on file  . Years of education: Not on file  . Highest education level: Not on file  Occupational History  . Not on file  Tobacco Use  . Smoking status: Current Some Day Smoker     Packs/day: 1.00    Years: 15.00    Pack years: 15.00    Types: Cigarettes    Last attempt to quit: 2012    Years since quitting: 10.2  . Smokeless tobacco: Never Used  Vaping Use  . Vaping Use: Never used  Substance and Sexual Activity  . Alcohol use: Yes    Comment: 2 drinks per month per pt.  . Drug use: Not Currently    Types: Marijuana  . Sexual activity: Not on file  Other Topics Concern  . Not on file  Social History Narrative   Works at [employer] investment banker Strain: Not on [employer] Insecurity: Not on file  Transportation Needs: Not on file  Physical Activity: Not on file  Stress: Not on file  Social Connections: Not on file     Family History: The patient's family history includes Healthy in his daughter, mother, and son; Heart attack in his father. There is no history of Colon cancer, Esophageal cancer, Rectal cancer, or Stomach cancer.  ROS:   All other ROS reviewed and negative. Pertinent positives noted in the HPI.     EKGs/Labs/Other Studies Reviewed:   The following studies were personally reviewed by me today:  Recent Labs: 09/20/2019: TSH 1.160 03/02/2020: ALT 32; BUN 16; Creatinine, Ser 1.17; Hemoglobin 18.0; Platelets 293; Potassium 4.8; Sodium 142   Recent Lipid Panel    Component Value Date/Time   CHOL 132 09/20/2019 1051   TRIG 223 (H) 09/20/2019 1051   TRIG 242 (H) 10/17/2016 0806   HDL 37 (L) 09/20/2019 1051   HDL 39 (L) 10/17/2016 0806   CHOLHDL 3.6 09/20/2019 1051   LDLCALC 59 09/20/2019 1051   LDLCALC 86 11/28/2013 1024   LDLDIRECT 53 06/28/2019 1139    Physical Exam:   VS:  BP 122/82   Pulse 73   Ht 5\' 10"  (1.778 m)   Wt 238 lb (108 kg)   SpO2 97%   BMI 34.15 kg/m    Wt Readings from Last 3 Encounters:  07/03/20 238 lb (108 kg)  06/21/20 237 lb 12.8 oz (107.9 kg)  05/31/20 244 lb (110.7 kg)    General: Well nourished, well developed, in no acute distress Head:  Atraumatic, normal size  Eyes: PEERLA, EOMI  Neck: Supple, no JVD Endocrine: No thryomegaly Cardiac: Normal S1, S2; RRR; no murmurs, rubs, or gallops Lungs: Clear to auscultation bilaterally, no wheezing, rhonchi or rales  Abd: Soft, nontender, no hepatomegaly  Ext: No edema, pulses 2+ Musculoskeletal: No deformities, BUE and BLE strength normal and  equal Skin: Warm and dry, no rashes   Neuro: Alert and oriented to person, place, time, and situation, CNII-XII grossly intact, no focal deficits  Psych: Normal mood and affect   ASSESSMENT:   OWYNN MOSQUEDA is a 55 y.o. male who presents for the following: 1. Coronary artery disease involving native coronary artery of native heart without angina pectoris   2. Mixed hyperlipidemia   3. Snoring   4. Chronic fatigue   5. Polycythemia   6. Pure hypercholesterolemia     PLAN:   1. Coronary artery disease involving native coronary artery of native heart without angina pectoris 2. Mixed hyperlipidemia -Abnormal stress test in the past.  Normal echo.  I suspect this was a false positive.  He has no symptoms of angina.  We will continue with medical therapy.  He takes an aspirin.  He is on Crestor 10 mg daily.  He is also on Vascepa given his elevated triglycerides.  Most recent LDL at goal.  Triglycerides were elevated.  We will recheck a fasting profile today.  He also has lost weight.  I suspect triglycerides will be lower.  Echocardiogram shows normal LV function.  Again, no symptoms of angina.  3. Snoring 4. Chronic fatigue 5. Polycythemia -Sleep apnea in the past.  Apparently this was treated with surgical correction.  I have noticed that his hemoglobin value was increasing.  I suspect this is secondary polycythemia due to hypoxia from likely sleep apnea.  I would like for him to repeat a home sleep study.  Disposition: Return in about 1 year (around 07/03/2021).  Medication Adjustments/Labs and Tests Ordered: Current medicines are  reviewed at length with the patient today.  Concerns regarding medicines are outlined above.  Orders Placed This Encounter  Procedures  . Lipid panel  . LDL cholesterol, direct  . Home sleep test   Meds ordered this encounter  Medications  . icosapent Ethyl (VASCEPA) 1 g capsule    Sig: Take 2 capsules (2 g total) by mouth 2 (two) times daily.    Dispense:  360 capsule    Refill:  3  . rosuvastatin (CRESTOR) 10 MG tablet    Sig: Take 1 tablet (10 mg total) by mouth daily.    Dispense:  90 tablet    Refill:  3    Patient Instructions  Medication Instructions:  Start Crestor 10 mg daily  Stop Lipitor  *If you need a refill on your cardiac medications before your next appointment, please call your pharmacy*   Lab Work: LIPID, DIRECT LDL today  If you have labs (blood work) drawn today and your tests are completely normal, you will receive your results only by: Marland Kitchen MyChart Message (if you have MyChart) OR . A paper copy in the mail If you have any lab test that is abnormal or we need to change your treatment, we will call you to review the results.   Testing/Procedures: Your physician has recommended that you have a sleep study. This test records several body functions during sleep, including: brain activity, eye movement, oxygen and carbon dioxide blood levels, heart rate and rhythm, breathing rate and rhythm, the flow of air through your mouth and nose, snoring, body muscle movements, and chest and belly movement.   Follow-Up: At [facility], you and your health needs are our priority.  As part of our continuing mission to provide you with exceptional heart care, we have created designated Provider Care Teams.  These Care Teams include your primary  Cardiologist (physician) and Advanced Practice Providers (APPs -  Physician Assistants and Nurse Practitioners) who all work together to provide you with the care you need, when you need it.  We recommend signing up for the  patient portal called "MyChart".  Sign up information is provided on this After Visit Summary.  MyChart is used to connect with patients for Virtual Visits (Telemedicine).  Patients are able to view lab/test results, encounter notes, upcoming appointments, etc.  Non-urgent messages can be sent to your provider as well.   To learn more about what you can do with MyChart, go to [email or web address].    Your next appointment:   12 month(s)  The format for your next appointment:   In Person  Provider:   Lennie Odor, MD       Time Spent with Patient: I have spent a total of 35 minutes with patient reviewing hospital notes, telemetry, EKGs, labs and examining the patient as well as establishing an assessment and plan that was discussed with the patient.  > 50% of time was spent in direct patient care.  Signed, Lenna Gilford. Flora Lipps, MD, [facility]  [facility]  [address], [address] [place], [place] [address] [phone]  07/03/2020 12:41 PM

## 2020-07-03 ENCOUNTER — Other Ambulatory Visit: Payer: Self-pay

## 2020-07-03 ENCOUNTER — Ambulatory Visit: Payer: BC Managed Care – PPO | Admitting: Cardiovascular Disease

## 2020-07-03 ENCOUNTER — Encounter: Payer: Self-pay | Admitting: Cardiovascular Disease

## 2020-07-03 VITALS — BP 122/82 | HR 73 | Ht 70.0 in | Wt 238.0 lb

## 2020-07-03 DIAGNOSIS — R0683 Snoring: Secondary | ICD-10-CM

## 2020-07-03 DIAGNOSIS — E782 Mixed hyperlipidemia: Secondary | ICD-10-CM

## 2020-07-03 DIAGNOSIS — D751 Secondary polycythemia: Secondary | ICD-10-CM

## 2020-07-03 DIAGNOSIS — R5382 Chronic fatigue, unspecified: Secondary | ICD-10-CM

## 2020-07-03 DIAGNOSIS — E7800 Pure hypercholesterolemia, unspecified: Secondary | ICD-10-CM

## 2020-07-03 DIAGNOSIS — I2510 Atherosclerotic heart disease of native coronary artery without angina pectoris: Secondary | ICD-10-CM

## 2020-07-03 LAB — LIPID PANEL
Chol/HDL Ratio: 2.8 ratio (ref 0.0–5.0)
Cholesterol, Total: 124 mg/dL (ref 100–199)
HDL: 44 mg/dL (ref >39)
LDL Chol Calc (NIH): 49 mg/dL (ref 0–99)
Triglycerides: 187 mg/dL — ABNORMAL HIGH (ref 0–149)
VLDL Cholesterol Cal: 31 mg/dL (ref 5–40)

## 2020-07-03 LAB — LDL CHOLESTEROL, DIRECT: LDL Direct: 59 mg/dL (ref 0–99)

## 2020-07-03 MED ORDER — ROSUVASTATIN CALCIUM 10 MG PO TABS: 10.0000 mg | ORAL_TABLET | Freq: Every day | ORAL | 3 refills | Status: DC

## 2020-07-03 MED ORDER — ICOSAPENT ETHYL 1 G PO CAPS: 2.0000 g | ORAL_CAPSULE | Freq: Two times a day (BID) | ORAL | 3 refills | Status: DC

## 2020-07-03 NOTE — Patient Instructions (Addendum)
Medication Instructions:  Start Crestor 10 mg daily  Stop Lipitor  *If you need a refill on your cardiac medications before your next appointment, please call your pharmacy*   Lab Work: LIPID, DIRECT LDL today  If you have labs (blood work) drawn today and your tests are completely normal, you will receive your results only by: Marland Kitchen MyChart Message (if you have MyChart) OR . A paper copy in the mail If you have any lab test that is abnormal or we need to change your treatment, we will call you to review the results.   Testing/Procedures: Your physician has recommended that you have a sleep study. This test records several body functions during sleep, including: brain activity, eye movement, oxygen and carbon dioxide blood levels, heart rate and rhythm, breathing rate and rhythm, the flow of air through your mouth and nose, snoring, body muscle movements, and chest and belly movement.   Follow-Up: At [facility], you and your health needs are our priority.  As part of our continuing mission to provide you with exceptional heart care, we have created designated Provider Care Teams.  These Care Teams include your primary Cardiologist (physician) and Advanced Practice Providers (APPs -  Physician Assistants and Nurse Practitioners) who all work together to provide you with the care you need, when you need it.  We recommend signing up for the patient portal called "MyChart".  Sign up information is provided on this After Visit Summary.  MyChart is used to connect with patients for Virtual Visits (Telemedicine).  Patients are able to view lab/test results, encounter notes, upcoming appointments, etc.  Non-urgent messages can be sent to your provider as well.   To learn more about what you can do with MyChart, go to [email or web address].    Your next appointment:   12 month(s)  The format for your next appointment:   In Person  Provider:   Lennie Odor, MD

## 2020-08-09 ENCOUNTER — Encounter: Payer: Self-pay | Admitting: Counselor

## 2020-08-09 ENCOUNTER — Ambulatory Visit (INDEPENDENT_AMBULATORY_CARE_PROVIDER_SITE_OTHER): Payer: BC Managed Care – PPO | Admitting: Counselor

## 2020-08-09 ENCOUNTER — Ambulatory Visit: Payer: BC Managed Care – PPO

## 2020-08-09 ENCOUNTER — Other Ambulatory Visit: Payer: Self-pay

## 2020-08-09 DIAGNOSIS — F09 Unspecified mental disorder due to known physiological condition: Secondary | ICD-10-CM

## 2020-08-09 DIAGNOSIS — F439 Reaction to severe stress, unspecified: Secondary | ICD-10-CM

## 2020-08-09 NOTE — Progress Notes (Signed)
   Psychometrist Note   Cognitive testing was administered to Scott Rasmussen by Lamar Benes, B.S. (Technician) under the supervision of Alphonzo Severance, Psy.D., ABN. [name] was able to tolerate all test procedures. Dr. Nicole Kindred met with the patient as needed to manage any emotional reactions to the testing procedures. Rest breaks were offered.    The battery of tests administered was selected by Dr. Nicole Kindred with consideration to the patient's current level of functioning, the nature of his symptoms, emotional and behavioral responses during the interview, level of literacy, observed level of motivation/effort, and the nature of the referral question. This battery was communicated to the psychometrist. Communication between Dr. Nicole Kindred and the psychometrist was ongoing throughout the evaluation and Dr. Nicole Kindred was immediately accessible at all times. Dr. Nicole Kindred provided supervision to the technician on the date of this service, to the extent necessary to assure the quality of all services provided.    Mr. Schoch will return in approximately one week for an interactive feedback session with Dr. Nicole Kindred, at which time test performance, clinical impressions, and treatment recommendations will be reviewed in detail. The patient understands he can contact our office should he require our assistance before this time.   A total of 110 minutes of billable time were spent with Scott Rasmussen by the technician, including test administration and scoring time. Billing for these services is reflected in Dr. Les Pou note.   This note reflects time spent with the psychometrician and does not include test scores, clinical history, or any interpretations made by Dr. Nicole Kindred. The full report will follow in a separate note.

## 2020-08-09 NOTE — Progress Notes (Signed)
NEUROPSYCHOLOGICAL TEST SCORES [facility]  Patient Name: Scott Rasmussen MRN: [id number] Date of Birth: [birth year] Age: 55 y.o. Education: 14 years  Measurement properties of test scores: IQ, Index, and Standard Scores (SS): Mean = 100; Standard Deviation = 15 Scaled Scores (Ss): Mean = 10; Standard Deviation = 3 Z scores (Z): Mean = 0; Standard Deviation = 1 T scores (T); Mean = 50; Standard Deviation = 10  TEST SCORES:    Note: This summary of test scores accompanies the interpretive report and should not be interpreted by unqualified individuals or in isolation without reference to the report. Test scores are relative to age, gender, and educational history as available and appropriate.   Performance Validity        ACS: Raw Descriptor      Word Choice: 48 Within Expectation      Rey 15 and Recognition: Raw Descriptor      Free Recall 14 Within Expectation      Recognition 10 ---      False Positives 0 ---      Combined Score 24 Within Expectation      MSVT: Raw Descriptor      IR 100 Within Expectation      DR 95 Within Expectation      CNS 95 Within Expectation      PA 100 ---      FR 90 ---      The Dot Counting Test: Raw Descriptor      E-Score 6 Within Expectation      Embedded Measures: Raw Descriptor      RBANS Effort Index: 0 Within Expectation      WAIS-IV Reliable Digit Span 12 Within Expectation      WAIS-IV Reliable Digit Span Revised 16 Within Expectation      Mental Status Screening     Total Score Descriptor  MoCA 26 Normal      Expected Functioning        Wide Range Achievement Test (Word Reading): Standard/Scaled Score Percentile       Word Reading 96 39      Reynolds Intellectual Screening Test Standard/T-score Percentile      Guess What 47 38      Odd Item Out 41 18  RIST Index 92 30      Cognitive Testing        RBANS, Form : Standard/Scaled Score Percentile  Total Score 98 45  Immediate Memory 100 50      List Learning  11 63      Story Memory 9 37  Visuospatial/Constructional 92 30      Figure Copy   (20) 13 84      Judgment of Line Orientation   (11) --- 3-9  Language 94 34      Picture Naming --- 51-75      Semantic Fluency 8 25  Attention 103 58      Digit Span 11 63      Coding 10 50  Delayed Memory 105 63      List Recall   (6) --- 26-50      List Recognition   (19) --- 26-50      Story Recall   (9) 9 37      Figure Recall   (16) 12 75      Wechsler Adult Intelligence Scale - IV: Standard/Scaled Score Percentile  Working Memory Index 114 82      Digit Span 11 63  Digit Span Forward 11 63          Digit Span Backward 13 84          Digit Span Sequencing 9 37      Arithmetic 14 91  Processing Speed Index 100 50      Symbol Search 10 50      Coding 10 50      Neuropsychological Assessment Battery (Language Module): T-score Percentile      Naming   (30) 53 62      Verbal Fluency: T-score Percentile      Controlled Oral Word Association (F-A-S) 41 18      Semantic Fluency (Animals) 48 42      Trail Making Test: T-Score Percentile      Part A 59 82      Part B 66 95      Modified [place] Card Sorting Test (MWCST): Standard/T-Score Percentile      Number of Categories Correct 54 66      Number of Perseverative Errors 62 88      Number of Total Errors 50 50      Percent Perseverative Errors 63 91  Executive Function Composite 113 81          Boston Diagnostic Aphasia Exam: Raw Score Scaled Score      Complex Ideational Material 10 7      Clock Drawing Raw Score Descriptor      Command 9 WNL      Rating Scales         Raw Score Descriptor  Patient Health Questionnaire - 9 4 Within Normal Limits  GAD-7 6 Mild  PTSD Checklist - 5 41 Positive   Keaundra Stehle V. Roseanne Reno PsyD, ABN Clinical Neuropsychologist
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
## 2020-08-14 DIAGNOSIS — M25511 Pain in right shoulder: Secondary | ICD-10-CM | POA: Diagnosis not present

## 2020-08-14 DIAGNOSIS — M79641 Pain in right hand: Secondary | ICD-10-CM | POA: Diagnosis not present

## 2020-08-14 DIAGNOSIS — M7521 Bicipital tendinitis, right shoulder: Secondary | ICD-10-CM | POA: Diagnosis not present

## 2020-08-16 ENCOUNTER — Ambulatory Visit (INDEPENDENT_AMBULATORY_CARE_PROVIDER_SITE_OTHER): Payer: Worker's Compensation | Admitting: Counselor

## 2020-08-16 ENCOUNTER — Other Ambulatory Visit: Payer: Self-pay

## 2020-08-16 ENCOUNTER — Encounter: Payer: Self-pay | Admitting: Counselor

## 2020-08-16 DIAGNOSIS — F439 Reaction to severe stress, unspecified: Secondary | ICD-10-CM

## 2020-08-16 NOTE — Patient Instructions (Signed)
Your performance on neuropsychological assessment was entirely normal. This means you were capable of reasonable cognitive performance relative to your age and education matched peer group. This is good! Nevertheless, normal test performance does not rule out cognitive problems and it is clear that you are having cognitive problems day-to-day. I think that the most likely cause of your cognitive problems is your mental health condition. You may also have untreated obstructive sleep apnea and you are not sleeping well.   First, you should undergo a sleep study as has been recommended by your medical treatment providers. Obstructive sleep apnea is a major risk factor for a number of negative health conditions including heart disease, cognitive problems, cerebrovascular problems, and even type II diabetes.   Second, I do think that you have a diagnosable trauma and stressor related disorder, although I am not entirely sure that you meet formal criteria for PTSD. You should know that these disorders are very treatable and often remit or substantially improve after a course of treatment. There are numerous evidence-based treatments for PTSD, including Cognitive Processing Therapy and Prolonged Exposure Therapy. Trauma-focused treatments typically involve some sort of exposure to the traumatic event, either in written or imaginary form. They are often self-contained treatments that take anywhere from 12 - 20 sessions although more treatment may be helpful after completing your initial course of treatment. These would be best pursued with a trained provider with the requisite skillset to deliver a trauma-focused treatment and specialty training in that area.   You may also consider sleep hygiene to help with your sleep problems. There are few things as disruptive to brain functioning as not getting a good night's sleep. For sleep, I recommend against using medications, which can have lingering sedating effects on  the brain and rob your brain of restful REM sleep. Instead, consider trying some of the following sleep hygiene recommendations. They may not work at once and may take effort, but the effort you spend is likely to be rewarded with better sleep eventually:   Stick to a sleep schedule of the same bedtime and wake time even on the weekends, which can help to regulate your body's internal clock so that you fall asleep and stay asleep.   Practice a relaxing bedtime ritual (conducted away from bright lights) which will help separate your sleep from stimulating activities and prepare your body to fall asleep when you go to bed.   Avoid naps, especially in the afternoon.   Evaluate your room and create conditions that will promote sleep such as keeping it cool (between 60 - 67 degrees), quiet, and free from any lights. Consider using blackout curtains, a "white noise" generator, or fan that will help mask any noises that might prevent you from going to sleep or awaken you during the night.   Sleep on a comfortable mattress and pillows.   Avoid bright light in the evening and excessive use of portable electronic devices right before bed that may contain light frequencies that can contribute to sleep problems.   Avoid alcohol, cigarettes, or heavy meals in the evening. If you must eat, consume a light snack 45 minutes before bed.   Use your bed only for sleep to strengthen the association between your bed and sleep.   If you can't go to sleep within 30 minutes, go into another room and do something relaxing until you feel tired. Then, come back and try to go to sleep again for 30 minutes and repeat until sleep is achieved.  Some people find over the counter melatonin to be helpful for sleep, which you could discuss with a pharmacist or prescribing provider.   My best guess is that your cognitive problems will improve as your sleep and underlying psychiatric disorder also improve. In the meantime, I  would encourage you not to overly worry about your cognitive problems and to view them as another manifestation of a treatable disorder.

## 2020-08-16 NOTE — Progress Notes (Signed)
NEUROPSYCHOLOGY FEEDBACK NOTE [facility]  Feedback Note: I met with Scott Rasmussen to review the findings resulting from his neuropsychological evaluation. Since the last appointment, he has been about the same. He owns several retail stores apparently, which he did not mention at the previous appointment, and he has been busy with those. He is also playing a lot of golf. Time was spent reviewing the impressions and recommendations that are detailed in the evaluation report. We discussed impression of normal cognitive performance and trauma/stressor related disorder, which is likely affecting memory and thinking as reflected in the patient instructions. I explained to him the substantial evidence base for manualized treatments with a  Condition such as his, including CPT and PE and recommended that he seek robust trauma focused therapy. I suggested that he speak with his psychiatrist who is coordinating his care and offered a referral if desired, which he can contact me for later, they have a meeting this Tuesday. I took time to explain the findings and answer all the patient's questions. I encouraged Scott Rasmussen to contact me should he have any further questions or if further follow up is desired.   Current Medications and Medical History   Current Outpatient Medications  Medication Sig Dispense Refill  . buPROPion (WELLBUTRIN SR) 100 MG 12 hr tablet Take 100 mg by mouth 2 (two) times daily.    . Coenzyme Q10 (COQ-10) 100 MG CAPS Take 100 mg by mouth. 1 Capsule Daily    . DULoxetine (CYMBALTA) 60 MG capsule Take 60 mg by mouth daily.    . febuxostat (ULORIC) 40 MG tablet Take 1 tablet (40 mg total) by mouth daily. 90 tablet 3  . icosapent Ethyl (VASCEPA) 1 g capsule Take 2 capsules (2 g total) by mouth 2 (two) times daily. 360 capsule 3  . metoprolol tartrate (LOPRESSOR) 25 MG tablet TAKE 1 TABLET TWICE A DAY 180 tablet 3  . omeprazole (PRILOSEC) 40 MG capsule Take 1 capsule (40 mg total)  by mouth daily. 90 capsule 3  . prazosin (MINIPRESS) 1 MG capsule Take 1 capsule (1 mg total) by mouth at bedtime. 90 capsule 0  . rosuvastatin (CRESTOR) 10 MG tablet Take 1 tablet (10 mg total) by mouth daily. 90 tablet 3  . sildenafil (VIAGRA) 100 MG tablet Take 0.5-1 tablets (50-100 mg total) by mouth daily as needed for erectile dysfunction. 5 tablet 11  . SYRINGE-NEEDLE, DISP, 3 ML (BD INTEGRA SYRINGE) 25G X 1" 3 ML MISC Use with B12 injections monthly 12 each 1  . Vitamin D, Ergocalciferol, (DRISDOL) 1.25 MG (50000 UNIT) CAPS capsule Take 1 capsule (50,000 Units total) by mouth every 7 (seven) days. 12 capsule 3   Current Facility-Administered Medications  Medication Dose Route Frequency Provider Last Rate Last Admin  . cyanocobalamin ((VITAMIN B-12)) injection 1,000 mcg  1,000 mcg Intramuscular Q30 days Delynn Flavin M, DO   1,000 mcg at 05/28/20 1356    Patient Active Problem List   Diagnosis Date Noted  . Vitamin B12 deficiency 05/28/2020  . Acute maxillary sinusitis 01/05/2020  . Cough 10/28/2019  . Plantar fasciitis 09/06/2019  . History of 2019 novel coronavirus disease (COVID-19) 03/14/2019  . Dyspnea on exertion 03/14/2019  . Multiple lung nodules 02/28/2019  . Liver lesion 02/28/2019  . COVID-19 virus infection 02/02/2019  . Degeneration of lumbar intervertebral disc 08/24/2018  . Acquired hallux rigidus of right foot 06/10/2018  . Metabolic syndrome 03/09/2018  . Thoracic aortic atherosclerosis (HCC) 12/05/2015  . Depression  06/01/2015  . Allergic rhinitis 08/15/2014  . Abnormal liver function test 11/08/2013  . Obesity, Class II, BMI 35-39.9 01/03/2013  . Gout 11/26/2012  . Hypertriglyceridemia 11/26/2012  . Gastroesophageal reflux disease without esophagitis 11/26/2012    Mental Status and Behavioral Observations  [name] presented on time to the present encounter and was alert and generally oriented. Speech was normal in rate, rhythm, volume, and  prosody. Self-reported mood was "allright" and affect was euthymic. Thought process was logical and goal oriented and thought content was appropriate to the topics discussed. There were no safety concerns identified at today's encounter, such as thoughts of harming self or others.   Plan  Feedback provided regarding the patient's neuropsychological evaluation. He has mild cognitive interference, which is likely related to a combination of trauma/stressor symptoms and perhaps also OSA. Encouraged him to follow through with sleep study. [name] was encouraged to contact me if any questions arise or if further follow up is desired.   [name], PsyD, ABN Clinical Neuropsychologist  Service(s) Provided at This Encounter: 19 minutes [phone]; Psychotherapy with patient/family)

## 2020-08-21 DIAGNOSIS — M5416 Radiculopathy, lumbar region: Secondary | ICD-10-CM | POA: Diagnosis not present

## 2020-08-24 ENCOUNTER — Encounter (HOSPITAL_BASED_OUTPATIENT_CLINIC_OR_DEPARTMENT_OTHER): Payer: BC Managed Care – PPO | Admitting: Cardiovascular Disease

## 2020-09-04 ENCOUNTER — Encounter: Payer: Self-pay | Admitting: Family Medicine

## 2020-09-06 DIAGNOSIS — M5416 Radiculopathy, lumbar region: Secondary | ICD-10-CM | POA: Diagnosis not present

## 2020-09-07 DIAGNOSIS — M25511 Pain in right shoulder: Secondary | ICD-10-CM | POA: Diagnosis not present

## 2020-09-10 ENCOUNTER — Ambulatory Visit: Payer: BC Managed Care – PPO | Admitting: Family Medicine

## 2020-09-10 ENCOUNTER — Encounter: Payer: Self-pay | Admitting: Family Medicine

## 2020-09-10 DIAGNOSIS — R413 Other amnesia: Secondary | ICD-10-CM

## 2020-09-10 DIAGNOSIS — F4310 Post-traumatic stress disorder, unspecified: Secondary | ICD-10-CM

## 2020-09-10 NOTE — Progress Notes (Signed)
Telephone visit  Subjective: CC: PTSD PCP: [name], DO [id number] K Sweetser is a 55 y.o. male calls for telephone consult today. Patient provides verbal consent for consult held via phone.  Due to COVID-19 pandemic this visit was conducted virtually. This visit type was conducted due to national recommendations for restrictions regarding the COVID-19 Pandemic (e.g. social distancing, sheltering in place) in an effort to limit this patient's exposure and mitigate transmission in our community. All issues noted in this document were discussed and addressed.  A physical exam was not performed with this format.   Location of patient: car Location of provider: [facility] Others present for call: none  1. PTSD Patient continues to see psychiatrist, who has been completing her portion of the FMLA.  He is also seeing a therapist that subspecializes in his condition.  He has seen neuropsychiatry and his symptoms were thought to be related to trauma/ stressors.  He continues to see Dr. Maryruth Bun regularly.  Overall he feels all right now.  Sleep is better on the Lunesta that he started.  He is off of prazosin.  Continues Wellbutrin and Cymbalta.  Will be planning to use some Ativan for flight soon.  Mental health otherwise seems to be okay.   ROS: Per HPI  Allergies  Allergen Reactions  . Depo-Medrol [Methylprednisolone Sodium Succ] Rash   Past Medical History:  Diagnosis Date  . Gout   . Hallux rigidus 10/11/2018   right   . HTN (hypertension)   . Hyperlipidemia   . Shortness of breath   . Sleep apnea    had sx   . Umbilical hernia     Current Outpatient Medications:  .  buPROPion (WELLBUTRIN SR) 100 MG 12 hr tablet, Take 100 mg by mouth 2 (two) times daily., Disp: , Rfl:  .  Coenzyme Q10 (COQ-10) 100 MG CAPS, Take 100 mg by mouth. 1 Capsule Daily, Disp: , Rfl:  .  DULoxetine (CYMBALTA) 60 MG capsule, Take 60 mg by mouth daily., Disp: , Rfl:  .  febuxostat (ULORIC) 40 MG tablet,  Take 1 tablet (40 mg total) by mouth daily., Disp: 90 tablet, Rfl: 3 .  icosapent Ethyl (VASCEPA) 1 g capsule, Take 2 capsules (2 g total) by mouth 2 (two) times daily., Disp: 360 capsule, Rfl: 3 .  metoprolol tartrate (LOPRESSOR) 25 MG tablet, TAKE 1 TABLET TWICE A DAY, Disp: 180 tablet, Rfl: 3 .  omeprazole (PRILOSEC) 40 MG capsule, Take 1 capsule (40 mg total) by mouth daily., Disp: 90 capsule, Rfl: 3 .  prazosin (MINIPRESS) 1 MG capsule, Take 1 capsule (1 mg total) by mouth at bedtime., Disp: 90 capsule, Rfl: 0 .  rosuvastatin (CRESTOR) 10 MG tablet, Take 1 tablet (10 mg total) by mouth daily., Disp: 90 tablet, Rfl: 3 .  sildenafil (VIAGRA) 100 MG tablet, Take 0.5-1 tablets (50-100 mg total) by mouth daily as needed for erectile dysfunction., Disp: 5 tablet, Rfl: 11 .  SYRINGE-NEEDLE, DISP, 3 ML (BD INTEGRA SYRINGE) 25G X 1" 3 ML MISC, Use with B12 injections monthly, Disp: 12 each, Rfl: 1 .  Vitamin D, Ergocalciferol, (DRISDOL) 1.25 MG (50000 UNIT) CAPS capsule, Take 1 capsule (50,000 Units total) by mouth every 7 (seven) days., Disp: 12 capsule, Rfl: 3  Current Facility-Administered Medications:  .  cyanocobalamin ((VITAMIN B-12)) injection 1,000 mcg, 1,000 mcg, Intramuscular, Q30 days, Delynn Flavin M, DO, 1,000 mcg at 05/28/20 1356  Assessment/ Plan: 55 y.o. male   PTSD (post-traumatic stress disorder)  Memory loss  Evaluated by neurology, neuro psychology and psychiatry.  Continues to follow-up with psychiatry and therapy.  Memory loss was thought to be secondary to trauma and not related to organic neurologic condition.  He continues to be out of work due to the after mentioned but overall seems to be doing okay.  He will follow-up with me in office in the next few months.  Start time: 3:15pm End time: 3:22pm  Total time spent on patient care (including telephone call/ virtual visit): 7 minutes  Deissy Guilbert Hulen Skains, DO [facility] [phone]

## 2020-09-12 ENCOUNTER — Other Ambulatory Visit: Payer: Self-pay | Admitting: Family Medicine

## 2020-09-12 DIAGNOSIS — D229 Melanocytic nevi, unspecified: Secondary | ICD-10-CM | POA: Diagnosis not present

## 2020-09-12 DIAGNOSIS — L570 Actinic keratosis: Secondary | ICD-10-CM | POA: Diagnosis not present

## 2020-09-12 DIAGNOSIS — L821 Other seborrheic keratosis: Secondary | ICD-10-CM | POA: Diagnosis not present

## 2020-09-12 DIAGNOSIS — L814 Other melanin hyperpigmentation: Secondary | ICD-10-CM | POA: Diagnosis not present

## 2020-09-13 DIAGNOSIS — M25511 Pain in right shoulder: Secondary | ICD-10-CM | POA: Diagnosis not present

## 2020-09-14 ENCOUNTER — Other Ambulatory Visit: Payer: Self-pay

## 2020-09-14 ENCOUNTER — Ambulatory Visit (HOSPITAL_BASED_OUTPATIENT_CLINIC_OR_DEPARTMENT_OTHER): Payer: BC Managed Care – PPO | Admitting: Cardiovascular Disease

## 2020-09-14 DIAGNOSIS — R0683 Snoring: Secondary | ICD-10-CM

## 2020-09-14 DIAGNOSIS — R5382 Chronic fatigue, unspecified: Secondary | ICD-10-CM

## 2020-09-27 ENCOUNTER — Other Ambulatory Visit: Payer: Self-pay | Admitting: Family Medicine

## 2020-11-30 ENCOUNTER — Encounter (HOSPITAL_BASED_OUTPATIENT_CLINIC_OR_DEPARTMENT_OTHER): Payer: BC Managed Care – PPO | Admitting: Cardiovascular Disease

## 2020-12-03 ENCOUNTER — Other Ambulatory Visit: Payer: Self-pay

## 2020-12-03 ENCOUNTER — Ambulatory Visit (HOSPITAL_BASED_OUTPATIENT_CLINIC_OR_DEPARTMENT_OTHER): Payer: BC Managed Care – PPO | Attending: Cardiovascular Disease | Admitting: Cardiovascular Disease

## 2020-12-03 DIAGNOSIS — S43431A Superior glenoid labrum lesion of right shoulder, initial encounter: Secondary | ICD-10-CM | POA: Diagnosis not present

## 2020-12-03 DIAGNOSIS — Z79899 Other long term (current) drug therapy: Secondary | ICD-10-CM | POA: Insufficient documentation

## 2020-12-03 DIAGNOSIS — G4733 Obstructive sleep apnea (adult) (pediatric): Secondary | ICD-10-CM | POA: Insufficient documentation

## 2020-12-03 DIAGNOSIS — R5382 Chronic fatigue, unspecified: Secondary | ICD-10-CM

## 2020-12-03 DIAGNOSIS — R0683 Snoring: Secondary | ICD-10-CM

## 2020-12-03 DIAGNOSIS — G4736 Sleep related hypoventilation in conditions classified elsewhere: Secondary | ICD-10-CM | POA: Insufficient documentation

## 2020-12-09 ENCOUNTER — Encounter (HOSPITAL_BASED_OUTPATIENT_CLINIC_OR_DEPARTMENT_OTHER): Payer: Self-pay | Admitting: Cardiovascular Disease

## 2020-12-09 NOTE — Procedures (Signed)
     Patient Name: Scott Rasmussen, Scott Rasmussen Date: 12/03/2020 Gender: Male D.O.B: [birth year] Age (years): 55 Referring Provider: Ronnald Ramp ONeal Height (inches): 70 Interpreting Physician: Nicki Guadalajara MD, ABSM Weight (lbs): 232 RPSGT: Evangeline Sink BMI: 33 MRN: [id number] Neck Size: 18.75  CLINICAL INFORMATION Sleep Study Type: HST  Indication for sleep study: snoring,  Epworth Sleepiness Score: 3  SLEEP STUDY TECHNIQUE A multi-channel overnight portable sleep study was performed. The channels recorded were: nasal airflow, thoracic respiratory movement, and oxygen saturation with a pulse oximetry. Snoring was also monitored.  MEDICATIONS buPROPion (WELLBUTRIN SR) 100 MG 12 hr tablet Coenzyme Q10 (COQ-10) 100 MG CAPS DULoxetine (CYMBALTA) 60 MG capsule eszopiclone (LUNESTA) 1 MG TABS tablet febuxostat (ULORIC) 40 MG tablet fluticasone (FLONASE) 50 MCG/ACT nasal spray icosapent Ethyl (VASCEPA) 1 g capsule LORazepam (ATIVAN) 0.5 MG tablet metoprolol tartrate (LOPRESSOR) 25 MG tablet omeprazole (PRILOSEC) 40 MG capsule rosuvastatin (CRESTOR) 10 MG tablet (Expired) sildenafil (VIAGRA) 100 MG tablet Vitamin D, Ergocalciferol, (DRISDOL) 1.25 MG (50000 UNIT) CAPS capsule  Patient self administered medications include: N/A.  SLEEP ARCHITECTURE Patient was studied for 338.7 minutes. The sleep efficiency was 100.0 % and the patient was supine for 48.9%. The arousal index was 0.0 per hour.  RESPIRATORY PARAMETERS The overall AHI was 22.3 per hour, with a central apnea index of 0 per hour. There is a significant positional component with supine sleep AHI 39.5/h versus non-supine sleep AHI 6.3/h.  The oxygen nadir was 66% during sleep.  CARDIAC DATA Mean heart rate during sleep was 79.7 bpm.  IMPRESSIONS - Moderate obstructive sleep apnea occurred during this study (AHI 22.3/h). - Severe oxygen desaturation to a nadir of 66%.  - Patient snored 21.0% during the  sleep.  DIAGNOSIS - Obstructive Sleep Apnea (G47.33) - Nocturnal Hypoxemia (G47.36)  RECOMMENDATIONS - In this patient with cardiovascular comorbidities recommend a CPAP titration study. If unable to obtain an in-lab study initiate Auto-PAP with EPR at 7 - 18 cm of water and heated humidifiation.  - Effort should be made to optimize nasal and oropharyngeal patency.   - Positional therapy avoiding supine position during sleep. - Avoid alcohol, sedatives and other CNS depressants that may worsen sleep apnea and disrupt normal sleep architecture. - Sleep hygiene should be reviewed to assess factors that may improve sleep quality. - Weight management [facility]) and regular exercise should be initiated or continued. - Recommend a download and sleep clinic evaluation after one month of therapy.   [Electronically signed] 12/09/2020 05:06 PM  Nicki Guadalajara MD, [facility], ABSM Diplomate, American Board of Sleep Medicine   NPI: [id number] [facility] PH: [phone]   FX: [phone] ACCREDITED BY THE AMERICAN ACADEMY OF SLEEP MEDICINE

## 2020-12-12 ENCOUNTER — Telehealth: Payer: Self-pay | Admitting: *Deleted

## 2020-12-12 ENCOUNTER — Other Ambulatory Visit: Payer: Self-pay | Admitting: Family Medicine

## 2020-12-12 ENCOUNTER — Encounter: Payer: Self-pay | Admitting: Family Medicine

## 2020-12-12 DIAGNOSIS — E538 Deficiency of other specified B group vitamins: Secondary | ICD-10-CM

## 2020-12-12 DIAGNOSIS — Z8639 Personal history of other endocrine, nutritional and metabolic disease: Secondary | ICD-10-CM

## 2020-12-12 NOTE — Telephone Encounter (Signed)
Been over a year since last vit d was drawn please advise refill?

## 2020-12-12 NOTE — Telephone Encounter (Signed)
Patient notified of HST results and recommendations. He agrees to proceed with CPAP titration. 

## 2020-12-12 NOTE — Telephone Encounter (Signed)
-----   Message from Lennette Bihari, MD sent at 12/09/2020  5:11 PM EDT ----- Scott Rasmussen, please notify pt and try CPAP titration; if unable, then Auto-PAP

## 2020-12-17 ENCOUNTER — Other Ambulatory Visit: Payer: Self-pay | Admitting: Cardiovascular Disease

## 2020-12-17 ENCOUNTER — Telehealth: Payer: Self-pay | Admitting: *Deleted

## 2020-12-17 DIAGNOSIS — G4733 Obstructive sleep apnea (adult) (pediatric): Secondary | ICD-10-CM

## 2020-12-17 NOTE — Telephone Encounter (Signed)
Prior Authorization for CPAP titration sent to [facility] via Fax . [phone].

## 2020-12-17 NOTE — Telephone Encounter (Signed)
-----   Message from Gaynelle Cage, [place] sent at 12/12/2020  3:45 PM EDT ----- CPAP titration

## 2020-12-20 ENCOUNTER — Other Ambulatory Visit: Payer: Self-pay

## 2020-12-20 ENCOUNTER — Other Ambulatory Visit: Payer: BC Managed Care – PPO

## 2020-12-20 DIAGNOSIS — Z8639 Personal history of other endocrine, nutritional and metabolic disease: Secondary | ICD-10-CM

## 2020-12-20 DIAGNOSIS — E538 Deficiency of other specified B group vitamins: Secondary | ICD-10-CM | POA: Diagnosis not present

## 2020-12-21 LAB — VITAMIN B12: Vitamin B-12: 1159 pg/mL (ref 232–1245)

## 2020-12-21 LAB — VITAMIN D 25 HYDROXY (VIT D DEFICIENCY, FRACTURES): Vit D, 25-Hydroxy: 62.1 ng/mL (ref 30.0–100.0)

## 2020-12-21 MED ORDER — VITAMIN D (ERGOCALCIFEROL) 1.25 MG (50000 UNIT) PO CAPS: 50000.0000 [IU] | ORAL_CAPSULE | ORAL | 1 refills | Status: DC

## 2020-12-21 NOTE — Progress Notes (Signed)
Patient aware and verbalizes understanding. Requesting refill of his vit d - rx sent

## 2020-12-21 NOTE — Addendum Note (Signed)
Addended by: Angela Adam on: 12/21/2020 10:48 AM   Modules accepted: Orders

## 2020-12-27 ENCOUNTER — Telehealth: Payer: Self-pay | Admitting: Family Medicine

## 2020-12-27 NOTE — Telephone Encounter (Signed)
Called pt to let him know that there is a medical assessment form in the drawer ready for him to pick up.

## 2021-01-01 ENCOUNTER — Telehealth: Payer: Self-pay | Admitting: *Deleted

## 2021-01-01 NOTE — Telephone Encounter (Signed)
Returned a call to the patient to let him know that I have not heard back from his insurance company. I tried to call them last week and was on hold for 1 hour and 5 minutes before hanging up. I will try them again if I do not get a response. Patient aware I will call him once I hear back from the insurance company.

## 2021-01-04 NOTE — Telephone Encounter (Signed)
Per [employer] no PA is required. Call reference # [id number].

## 2021-01-08 DIAGNOSIS — M25511 Pain in right shoulder: Secondary | ICD-10-CM | POA: Diagnosis not present

## 2021-01-15 ENCOUNTER — Ambulatory Visit (HOSPITAL_BASED_OUTPATIENT_CLINIC_OR_DEPARTMENT_OTHER): Payer: BC Managed Care – PPO | Attending: Cardiovascular Disease | Admitting: Cardiovascular Disease

## 2021-01-15 ENCOUNTER — Other Ambulatory Visit: Payer: Self-pay

## 2021-01-15 DIAGNOSIS — G4730 Sleep apnea, unspecified: Secondary | ICD-10-CM | POA: Insufficient documentation

## 2021-01-15 DIAGNOSIS — G4733 Obstructive sleep apnea (adult) (pediatric): Secondary | ICD-10-CM | POA: Insufficient documentation

## 2021-01-18 ENCOUNTER — Other Ambulatory Visit: Payer: Self-pay | Admitting: Family Medicine

## 2021-01-25 ENCOUNTER — Encounter (HOSPITAL_BASED_OUTPATIENT_CLINIC_OR_DEPARTMENT_OTHER): Payer: Self-pay | Admitting: Cardiovascular Disease

## 2021-01-25 NOTE — Procedures (Signed)
Patient Name: Scott Rasmussen, Scott Rasmussen Date: 01/15/2021 Gender: Male D.O.B: 1966/04/11 Age (years): 55 Referring Provider: Ronnald Ramp ONeal Height (inches): 70 Interpreting Physician: Nicki Guadalajara MD, ABSM Weight (lbs): 240 RPSGT: Shelah Lewandowsky BMI: 34 MRN: [id number] Neck Size: 18.00  CLINICAL INFORMATION The patient is referred for a CPAP titration to treat sleep apnea.  Date of HST: 12/03/2020: AHI 22.3/h with supine AHI 39.5/h; non-supine AHI 6.3/h; O2 nadir 66%.  SLEEP STUDY TECHNIQUE As per the AASM Manual for the Scoring of Sleep and Associated Events v2.3 (April 2016) with a hypopnea requiring 4% desaturations.  The channels recorded and monitored were frontal, central and occipital EEG, electrooculogram (EOG), submentalis EMG (chin), nasal and oral airflow, thoracic and abdominal wall motion, anterior tibialis EMG, snore microphone, electrocardiogram, and pulse oximetry. Continuous positive airway pressure (CPAP) was initiated at the beginning of the study and titrated to treat sleep-disordered breathing.  MEDICATIONS fluticasone (FLONASE) 50 MCG/ACT nasal spray buPROPion (WELLBUTRIN SR) 100 MG 12 hr tablet Coenzyme Q10 (COQ-10) 100 MG CAPS DULoxetine (CYMBALTA) 60 MG capsule eszopiclone (LUNESTA) 1 MG TABS tablet febuxostat (ULORIC) 40 MG tablet icosapent Ethyl (VASCEPA) 1 g capsule LORazepam (ATIVAN) 0.5 MG tablet metoprolol tartrate (LOPRESSOR) 25 MG tablet omeprazole (PRILOSEC) 40 MG capsule rosuvastatin (CRESTOR) 10 MG tablet (Expired) sildenafil (VIAGRA) 100 MG tablet SYRINGE-NEEDLE, DISP, 3 ML (BD INTEGRA SYRINGE) 25G X 1" 3 ML MISC Vitamin D, Ergocalciferol, (DRISDOL) 1.25 MG (50000 UNIT) CAPS capsule Medications self-administered by patient taken the night of the study : LUNESTA  TECHNICIAN COMMENTS Comments added by technician: None Comments added by scorer: N/A  RESPIRATORY PARAMETERS Optimal PAP Pressure (cm): 15 AHI at Optimal Pressure  (/hr): 0 Overall Minimal O2 (%): 85.0 Supine % at Optimal Pressure (%): 33 Minimal O2 at Optimal Pressure (%): 90.0   SLEEP ARCHITECTURE The study was initiated at 9:54:45 PM and ended at 4:54:47 AM.  Sleep onset time was 27.7 minutes and the sleep efficiency was 86.9%%. The total sleep time was 365 minutes.  The patient spent 4.1%% of the night in stage N1 sleep, 68.4%% in stage N2 sleep, 0.0%% in stage N3 and 27.5% in REM.Stage REM latency was 119.0 minutes  Wake after sleep onset was 27.3. Alpha intrusion was absent. Supine sleep was 71.37%.  CARDIAC DATA The 2 lead EKG demonstrated sinus rhythm. The mean heart rate was 75.5 beats per minute. Other EKG findings include: None.  LEG MOVEMENT DATA The total Periodic Limb Movements of Sleep (PLMS) were 0. The PLMS index was 0.0. A PLMS index of <15 is considered normal in adults.  IMPRESSIONS - CPAP was initiated at 5 cm and was titrated to optimal PAP pressure at 15 cm of water (AHI 0, O2 nadir 90%). - Central sleep apnea was not noted during this titration (CAI = 0.5/h). - Moderate oxygen desaturations to a nadir of 85% at 9 cm of water. - Snoring was eliminated with CPAP. - No cardiac abnormalities were observed during this study. - Clinically significant periodic limb movements were not noted during this study. Arousals associated with PLMs were rare.  DIAGNOSIS - Obstructive Sleep Apnea (G47.33)  RECOMMENDATIONS - Recommend an initial trial of CPAP therapy with EPR of 3 at 15 cm H2O with heated humidification.  A Medium size Fisher&Paykel Full Face Mask F&P Vitera (new) mask was used for the titration. - Effort should be made to optimize nasal and oropharyngeal patency. - Avoid alcohol, sedatives and other CNS depressants that may worsen sleep apnea and disrupt  normal sleep architecture. - Sleep hygiene should be reviewed to assess factors that may improve sleep quality. - Weight management (BMI 34) and regular exercise should  be initiated or continued. - Recommend a download in 30 days and sleep clinic evaluation after 4 weeks of therapy.   [Electronically signed] 01/25/2021 08:59 AM  Nicki Guadalajara MD, [facility], ABSM Diplomate, American Board of Sleep Medicine   NPI: [id number]  [facility] PH: [phone]   FX: [phone] ACCREDITED BY THE AMERICAN ACADEMY OF SLEEP MEDICINE

## 2021-01-29 ENCOUNTER — Encounter: Payer: Self-pay | Admitting: Family Medicine

## 2021-01-29 ENCOUNTER — Ambulatory Visit: Payer: BC Managed Care – PPO | Admitting: Family Medicine

## 2021-01-29 DIAGNOSIS — J0110 Acute frontal sinusitis, unspecified: Secondary | ICD-10-CM

## 2021-01-29 MED ORDER — PREDNISONE 20 MG PO TABS: 40.0000 mg | ORAL_TABLET | Freq: Every day | ORAL | 0 refills | Status: AC

## 2021-01-29 MED ORDER — AMOXICILLIN-POT CLAVULANATE 875-125 MG PO TABS: 1.0000 | ORAL_TABLET | Freq: Two times a day (BID) | ORAL | 0 refills | Status: AC

## 2021-01-29 NOTE — Progress Notes (Signed)
Virtual Visit via telephone Note Due to COVID-19 pandemic this visit was conducted virtually. This visit type was conducted due to national recommendations for restrictions regarding the COVID-19 Pandemic (e.g. social distancing, sheltering in place) in an effort to limit this patient's exposure and mitigate transmission in our community. All issues noted in this document were discussed and addressed.  A physical exam was not performed with this format.   I connected with Scott Rasmussen on 01/29/2021 at 1010 by telephone and verified that I am speaking with the correct person using two identifiers. Scott Rasmussen is currently located at home and  no one  is currently with them during visit. The provider, Kari Baars, FNP is located in their office at time of visit.  I discussed the limitations, risks, security and privacy concerns of performing an evaluation and management service by telephone and the availability of in person appointments. I also discussed with the patient that there may be a patient responsible charge related to this service. The patient expressed understanding and agreed to proceed.  Subjective:  Patient ID: Scott Rasmussen, male    DOB: 11/18/1965, 55 y.o.   MRN: [id number]  Chief Complaint:  Sinus Problem   HPI: Scott Rasmussen is a 55 y.o. male presenting on 01/29/2021 for Sinus Problem   Pt reports ongoing frontal sinus pressure for 3-4 weeks. Has been using Flonase and over the counter sinus medications without relief of symptoms.  Sinus Problem This is a new problem. The current episode started 1 to 4 weeks ago. The problem is unchanged. His pain is at a severity of 5/10. The pain is moderate. Associated symptoms include congestion, headaches and sinus pressure. Pertinent negatives include no chills, coughing, diaphoresis, ear pain, hoarse voice, neck pain, shortness of breath, sneezing, sore throat or swollen glands. Past treatments include oral decongestants and  spray decongestants. The treatment provided no relief.    Relevant past medical, surgical, family, and social history reviewed and updated as indicated.  Allergies and medications reviewed and updated.   Past Medical History:  Diagnosis Date   Gout    Hallux rigidus 10/11/2018   right    HTN (hypertension)    Hyperlipidemia    Shortness of breath    Sleep apnea    had sx    Umbilical hernia     Past Surgical History:  Procedure Laterality Date   TONSILLECTOMY AND ADENOIDECTOMY  2008   UVULOPALATOPHARYNGOPLASTY  2008    Social History   Socioeconomic History   Marital status: Married    Spouse name: Not on file   Number of children: Not on file   Years of education: Not on file   Highest education level: Not on file  Occupational History   Not on file  Tobacco Use   Smoking status: Some Days    Packs/day: 1.00    Years: 15.00    Pack years: 15.00    Types: Cigarettes    Last attempt to quit: 2012    Years since quitting: 10.7   Smokeless tobacco: Never  Vaping Use   Vaping Use: Never used  Substance and Sexual Activity   Alcohol use: Yes    Comment: 2 drinks per month per pt.   Drug use: Not Currently    Types: Marijuana   Sexual activity: Not on file  Other Topics Concern   Not on file  Social History Narrative   Works at [employer]  Financial Resource Strain: Not on file  Food Insecurity: Not on file  Transportation Needs: Not on file  Physical Activity: Not on file  Stress: Not on file  Social Connections: Not on file  Intimate Partner Violence: Not on file    Outpatient Encounter Medications as of 01/29/2021  Medication Sig   amoxicillin-clavulanate (AUGMENTIN) 875-125 MG tablet Take 1 tablet by mouth 2 (two) times daily for 10 days.   fluticasone (FLONASE) 50 MCG/ACT nasal spray USE 2 SPRAYS IN EACH NOSTRIL DAILY   predniSONE (DELTASONE) 20 MG tablet Take 2 tablets (40 mg total) by mouth daily with  breakfast for 5 days.   buPROPion (WELLBUTRIN SR) 100 MG 12 hr tablet Take 100 mg by mouth 2 (two) times daily.   Coenzyme Q10 (COQ-10) 100 MG CAPS Take 100 mg by mouth. 1 Capsule Daily   DULoxetine (CYMBALTA) 60 MG capsule Take 60 mg by mouth daily.   eszopiclone (LUNESTA) 1 MG TABS tablet Take 1 mg by mouth at bedtime as needed.   febuxostat (ULORIC) 40 MG tablet TAKE 1 TABLET DAILY   icosapent Ethyl (VASCEPA) 1 g capsule Take 2 capsules (2 g total) by mouth 2 (two) times daily.   LORazepam (ATIVAN) 0.5 MG tablet lorazepam 0.5 mg tablet  Take 1 tablet 1 hour prior to flight. Repeat 1 time if needed.   metoprolol tartrate (LOPRESSOR) 25 MG tablet TAKE 1 TABLET TWICE A DAY   omeprazole (PRILOSEC) 40 MG capsule TAKE 1 CAPSULE DAILY   rosuvastatin (CRESTOR) 10 MG tablet Take 1 tablet (10 mg total) by mouth daily.   sildenafil (VIAGRA) 100 MG tablet Take 0.5-1 tablets (50-100 mg total) by mouth daily as needed for erectile dysfunction.   SYRINGE-NEEDLE, DISP, 3 ML (BD INTEGRA SYRINGE) 25G X 1" 3 ML MISC Use with B12 injections monthly   Vitamin D, Ergocalciferol, (DRISDOL) 1.25 MG (50000 UNIT) CAPS capsule Take 1 capsule (50,000 Units total) by mouth every 7 (seven) days.   Facility-Administered Encounter Medications as of 01/29/2021  Medication   cyanocobalamin ((VITAMIN B-12)) injection 1,000 mcg    Allergies  Allergen Reactions   Depo-Medrol [Methylprednisolone Sodium Succ] Rash    Review of Systems  Constitutional:  Positive for activity change, appetite change, fatigue and fever. Negative for chills, diaphoresis and unexpected weight change.  HENT:  Positive for congestion, postnasal drip, sinus pressure and sinus pain. Negative for ear pain, hoarse voice, sneezing and sore throat.   Respiratory:  Negative for cough and shortness of breath.   Cardiovascular:  Negative for chest pain, palpitations and leg swelling.  Gastrointestinal:  Negative for abdominal pain.  Genitourinary:   Negative for decreased urine volume and difficulty urinating.  Musculoskeletal:  Negative for neck pain.  Neurological:  Positive for headaches. Negative for dizziness, tremors, seizures, syncope, facial asymmetry, speech difficulty, weakness, light-headedness and numbness.  Psychiatric/Behavioral:  Negative for confusion.   All other systems reviewed and are negative.       Observations/Objective: No vital signs or physical exam, this was a telephone or virtual health encounter.  Pt alert and oriented, answers all questions appropriately, and able to speak in full sentences.    Assessment and Plan: Farmer was seen today for sinus problem.  Diagnoses and all orders for this visit:  Acute non-recurrent frontal sinusitis Ongoing symptoms for over 3 weeks despite conservative therapy with Flonase, sudafed, and tylenol. Will add prednisone burst and Augmentin. Pt aware to continue symptomatic care. Report any new, worsening, or persistent symptoms.  -  amoxicillin-clavulanate (AUGMENTIN) 875-125 MG tablet; Take 1 tablet by mouth 2 (two) times daily for 10 days. -     predniSONE (DELTASONE) 20 MG tablet; Take 2 tablets (40 mg total) by mouth daily with breakfast for 5 days.     Follow Up Instructions: Return if symptoms worsen or fail to improve.    I discussed the assessment and treatment plan with the patient. The patient was provided an opportunity to ask questions and all were answered. The patient agreed with the plan and demonstrated an understanding of the instructions.   The patient was advised to call back or seek an in-person evaluation if the symptoms worsen or if the condition fails to improve as anticipated.  The above assessment and management plan was discussed with the patient. The patient verbalized understanding of and has agreed to the management plan. Patient is aware to call the clinic if they develop any new symptoms or if symptoms persist or worsen. Patient is  aware when to return to the clinic for a follow-up visit. Patient educated on when it is appropriate to go to the emergency department.    I provided 12 minutes of non-face-to-face time during this encounter. The call started at 1010. The call ended at 1020. The other time was used for coordination of care.    Kari Baars, FNP-C [facility] [address] [place], [place] [address] [phone] 01/29/2021

## 2021-02-01 DIAGNOSIS — L820 Inflamed seborrheic keratosis: Secondary | ICD-10-CM | POA: Diagnosis not present

## 2021-02-01 DIAGNOSIS — L821 Other seborrheic keratosis: Secondary | ICD-10-CM | POA: Diagnosis not present

## 2021-02-01 DIAGNOSIS — D1801 Hemangioma of skin and subcutaneous tissue: Secondary | ICD-10-CM | POA: Diagnosis not present

## 2021-02-01 DIAGNOSIS — A630 Anogenital (venereal) warts: Secondary | ICD-10-CM | POA: Diagnosis not present

## 2021-02-01 DIAGNOSIS — B079 Viral wart, unspecified: Secondary | ICD-10-CM | POA: Diagnosis not present

## 2021-02-01 DIAGNOSIS — B078 Other viral warts: Secondary | ICD-10-CM | POA: Diagnosis not present

## 2021-02-04 ENCOUNTER — Telehealth: Payer: Self-pay | Admitting: *Deleted

## 2021-02-04 NOTE — Telephone Encounter (Signed)
Mychart message has been sent notifying the patient that his sleep study has been completed and his CPAP machine has been sent to [facility].

## 2021-02-26 ENCOUNTER — Encounter (HOSPITAL_BASED_OUTPATIENT_CLINIC_OR_DEPARTMENT_OTHER): Payer: BC Managed Care – PPO | Admitting: Cardiovascular Disease

## 2021-03-06 ENCOUNTER — Encounter: Payer: Self-pay | Admitting: Nurse Practitioner

## 2021-03-06 ENCOUNTER — Ambulatory Visit (INDEPENDENT_AMBULATORY_CARE_PROVIDER_SITE_OTHER): Payer: BC Managed Care – PPO | Admitting: Nurse Practitioner

## 2021-03-06 ENCOUNTER — Encounter: Payer: Self-pay | Admitting: Family Medicine

## 2021-03-06 DIAGNOSIS — R052 Subacute cough: Secondary | ICD-10-CM

## 2021-03-06 MED ORDER — PSEUDOEPH-BROMPHEN-DM 30-2-10 MG/5ML PO SYRP: 5.0000 mL | ORAL_SOLUTION | Freq: Four times a day (QID) | ORAL | 0 refills | Status: DC | PRN

## 2021-03-06 MED ORDER — PREDNISONE 10 MG (21) PO TBPK: ORAL_TABLET | ORAL | Status: DC

## 2021-03-06 NOTE — Patient Instructions (Signed)

## 2021-03-06 NOTE — Progress Notes (Signed)
   Virtual Visit  Note Due to COVID-19 pandemic this visit was conducted virtually. This visit type was conducted due to national recommendations for restrictions regarding the COVID-19 Pandemic (e.g. social distancing, sheltering in place) in an effort to limit this patient's exposure and mitigate transmission in our community. All issues noted in this document were discussed and addressed.  A physical exam was not performed with this format.  I connected with Scott Rasmussen on 03/06/21 at 3:25 pm  by telephone and verified that I am speaking with the correct person using two identifiers. Scott Rasmussen is currently located at home during visit. The provider, Daryll Drown, NP is located in their office at time of visit.  I discussed the limitations, risks, security and privacy concerns of performing an evaluation and management service by telephone and the availability of in person appointments. I also discussed with the patient that there may be a patient responsible charge related to this service. The patient expressed understanding and agreed to proceed.   History and Present Illness:  Cough This is a recurrent problem. The current episode started 1 to 4 weeks ago. The problem has been unchanged. The problem occurs constantly. The cough is Non-productive. Associated symptoms include chest pain, headaches and nasal congestion. Pertinent negatives include no fever or wheezing. He has tried OTC cough suppressant for the symptoms. The treatment provided no relief.     Review of Systems  Constitutional:  Negative for fever.  Respiratory:  Positive for cough. Negative for wheezing.   Cardiovascular:  Positive for chest pain.  Neurological:  Positive for headaches.    Observations/Objective: Tele-visit patient is not in distress  Assessment and Plan: Take meds as prescribed - Use a cool mist humidifier  -Use saline nose sprays frequently -Force fluids -For fever or aches or pains-  take Tylenol or ibuprofen. -prednisone pack -Bromfed 5 ml for cough and congestion -If symptoms do not improve, she may need to be COVID tested to rule this out Follow up with worsening unresolved symptoms   Follow Up Instructions:  Follow-up with unresolved symptoms.     I discussed the assessment and treatment plan with the patient. The patient was provided an opportunity to ask questions and all were answered. The patient agreed with the plan and demonstrated an understanding of the instructions.   The patient was advised to call back or seek an in-person evaluation if the symptoms worsen or if the condition fails to improve as anticipated.  The above assessment and management plan was discussed with the patient. The patient verbalized understanding of and has agreed to the management plan. Patient is aware to call the clinic if symptoms persist or worsen. Patient is aware when to return to the clinic for a follow-up visit. Patient educated on when it is appropriate to go to the emergency department.   Time call ended: 3:35 PM  I provided 10 minutes of  non face-to-face time during this encounter.    Daryll Drown, NP

## 2021-03-06 NOTE — Assessment & Plan Note (Signed)
Take meds as prescribed - Use a cool mist humidifier  -Use saline nose sprays frequently -Force fluids -For fever or aches or pains- take Tylenol or ibuprofen. -prednisone pack -Bromfed 5 ml for cough and congestion -If symptoms do not improve, she may need to be COVID tested to rule this out

## 2021-03-26 ENCOUNTER — Other Ambulatory Visit: Payer: Self-pay | Admitting: Family Medicine

## 2021-03-27 ENCOUNTER — Other Ambulatory Visit: Payer: Self-pay

## 2021-03-27 ENCOUNTER — Ambulatory Visit (INDEPENDENT_AMBULATORY_CARE_PROVIDER_SITE_OTHER): Payer: BC Managed Care – PPO | Admitting: Family Medicine

## 2021-03-27 ENCOUNTER — Ambulatory Visit (INDEPENDENT_AMBULATORY_CARE_PROVIDER_SITE_OTHER): Payer: BC Managed Care – PPO

## 2021-03-27 ENCOUNTER — Encounter: Payer: Self-pay | Admitting: Family Medicine

## 2021-03-27 VITALS — BP 143/85 | HR 85 | Temp 97.9°F | Ht 70.0 in | Wt 250.0 lb

## 2021-03-27 DIAGNOSIS — Z0000 Encounter for general adult medical examination without abnormal findings: Secondary | ICD-10-CM

## 2021-03-27 DIAGNOSIS — R058 Other specified cough: Secondary | ICD-10-CM

## 2021-03-27 DIAGNOSIS — Z0001 Encounter for general adult medical examination with abnormal findings: Secondary | ICD-10-CM

## 2021-03-27 DIAGNOSIS — R519 Headache, unspecified: Secondary | ICD-10-CM | POA: Diagnosis not present

## 2021-03-27 DIAGNOSIS — E781 Pure hyperglyceridemia: Secondary | ICD-10-CM

## 2021-03-27 DIAGNOSIS — R3121 Asymptomatic microscopic hematuria: Secondary | ICD-10-CM

## 2021-03-27 DIAGNOSIS — R059 Cough, unspecified: Secondary | ICD-10-CM | POA: Diagnosis not present

## 2021-03-27 DIAGNOSIS — Z1159 Encounter for screening for other viral diseases: Secondary | ICD-10-CM

## 2021-03-27 DIAGNOSIS — Z87891 Personal history of nicotine dependence: Secondary | ICD-10-CM

## 2021-03-27 DIAGNOSIS — Z23 Encounter for immunization: Secondary | ICD-10-CM

## 2021-03-27 DIAGNOSIS — I700 Atherosclerosis of aorta: Secondary | ICD-10-CM | POA: Diagnosis not present

## 2021-03-27 DIAGNOSIS — Z114 Encounter for screening for human immunodeficiency virus [HIV]: Secondary | ICD-10-CM

## 2021-03-27 MED ORDER — BUPROPION HCL ER (SR) 100 MG PO TB12
100.0000 mg | ORAL_TABLET | Freq: Two times a day (BID) | ORAL | 3 refills | Status: DC
Start: 2021-03-27 — End: 2021-03-27

## 2021-03-27 MED ORDER — ROSUVASTATIN CALCIUM 10 MG PO TABS: 10.0000 mg | ORAL_TABLET | Freq: Every day | ORAL | 3 refills | Status: DC

## 2021-03-27 MED ORDER — METOPROLOL TARTRATE 25 MG PO TABS: 25.0000 mg | ORAL_TABLET | Freq: Two times a day (BID) | ORAL | 3 refills | Status: DC

## 2021-03-27 MED ORDER — ICOSAPENT ETHYL 1 G PO CAPS: 2.0000 g | ORAL_CAPSULE | Freq: Two times a day (BID) | ORAL | 3 refills | Status: DC

## 2021-03-27 MED ORDER — DULOXETINE HCL 60 MG PO CPEP: 60.0000 mg | ORAL_CAPSULE | Freq: Every day | ORAL | 3 refills | Status: DC

## 2021-03-27 MED ORDER — BUPROPION HCL ER (SR) 100 MG PO TB12: 100.0000 mg | ORAL_TABLET | Freq: Two times a day (BID) | ORAL | 3 refills | Status: DC

## 2021-03-27 MED ORDER — METOPROLOL TARTRATE 25 MG PO TABS
25.0000 mg | ORAL_TABLET | Freq: Two times a day (BID) | ORAL | 3 refills | Status: DC
Start: 2021-03-27 — End: 2021-03-27

## 2021-03-27 NOTE — Progress Notes (Signed)
Scott Rasmussen is a 55 y.o. male presents to office today for annual physical exam examination.    Concerns today include: 1.  Weight gain, uncontrolled anxiety depression Patient has an appoint with Dr. Jake Michaelis in the next couple of weeks.  They manage his medications.  Currently being treated with Wellbutrin, Cymbalta, Ativan.  He has Lunesta but he is not taking this as he was advised against utilization of this medication by his sleep specialist until he can be treated with CPAP.  He does feel that once he starts the CPAP his rest and mental health will be better.  He admits that his sleep at nighttime is pretty poor and that he wakes up fatigued.  The best sleep that he gets is the 2 hours that he goes back to sleep after he is woken up. B12 levels were obtained recently and these were normal.  He had been off of the injection for a while prior to lab collection  2.  Headache/cough, tobacco use disorder Patient does report a new daily headache that occurs about 75% of the week on the left.  He reports it being just over the left eye and radiating into the left cheek.  Denies any associated visual disturbance or tenderness to palpation to the face.  He denies any tearing of the eye or nasal drainage.  This occurs sporadically does not seem to be associated by any food.  Sometimes it made him sick on his stomach.  He does not report any photophobia or phonophobia.  He is an former smoker.  He quit 4 weeks ago and attributes some of his weight gain to this.  He had smoked a pack per day and has done so for 22.5 years total.  There was an 8-year break in between smoking.  He reports a cough that is been hard to kick but denies any hemoptysis or brown sputum.  No unplanned weight loss or night sweats.  No shortness of breath or chest pain   Occupation: Disabled currently, Marital status: Married, Substance use: Tobacco use Diet: No structure, Exercise: Rare exercise Last colonoscopy:  Up-to-date Refills needed today: All meds Immunizations needed: Immunization History  Administered Date(s) Administered   Influenza,inj,Quad PF,6+ Mos 02/21/2013, 01/16/2014, 01/30/2015, 03/03/2016, 01/27/2017, 02/16/2018, 01/07/2019, 01/11/2020   PFIZER(Purple Top)SARS-COV-2 Vaccination 07/14/2019, 08/08/2019, 05/02/2020   Tdap 11/16/2012   Zoster Recombinat (Shingrix) 03/22/2018, 06/14/2018     Past Medical History:  Diagnosis Date   Gout    Hallux rigidus 10/11/2018   right    HTN (hypertension)    Hyperlipidemia    Shortness of breath    Sleep apnea    had sx    Umbilical hernia    Social History   Socioeconomic History   Marital status: Married    Spouse name: Not on file   Number of children: Not on file   Years of education: Not on file   Highest education level: Not on file  Occupational History   Not on file  Tobacco Use   Smoking status: Some Days    Packs/day: 1.00    Years: 15.00    Pack years: 15.00    Types: Cigarettes    Last attempt to quit: 2012    Years since quitting: 10.9   Smokeless tobacco: Never  Vaping Use   Vaping Use: Never used  Substance and Sexual Activity   Alcohol use: Yes    Comment: 2 drinks per month per pt.   Drug use: Not  Currently    Types: Marijuana   Sexual activity: Not on file  Other Topics Concern   Not on file  Social History Narrative   Works at [employer] Strain: Not on [employer] Insecurity: Not on file  Transportation Needs: Not on file  Physical Activity: Not on file  Stress: Not on file  Social Connections: Not on file  Intimate Partner Violence: Not on file   Past Surgical History:  Procedure Laterality Date   TONSILLECTOMY AND ADENOIDECTOMY  2008   UVULOPALATOPHARYNGOPLASTY  2008   Family History  Problem Relation Age of Onset   Healthy Mother    Heart attack Father    Healthy Daughter    Healthy Son    Colon cancer Neg Hx     Esophageal cancer Neg Hx    Rectal cancer Neg Hx    Stomach cancer Neg Hx     Current Outpatient Medications:    fluticasone (FLONASE) 50 MCG/ACT nasal spray, USE 2 SPRAYS IN EACH NOSTRIL DAILY, Disp: 48 g, Rfl: 3   buPROPion (WELLBUTRIN SR) 100 MG 12 hr tablet, Take 100 mg by mouth 2 (two) times daily., Disp: , Rfl:    Coenzyme Q10 (COQ-10) 100 MG CAPS, Take 100 mg by mouth. 1 Capsule Daily, Disp: , Rfl:    DULoxetine (CYMBALTA) 60 MG capsule, Take 60 mg by mouth daily., Disp: , Rfl:    eszopiclone (LUNESTA) 1 MG TABS tablet, Take 1 mg by mouth at bedtime as needed., Disp: , Rfl:    febuxostat (ULORIC) 40 MG tablet, TAKE 1 TABLET DAILY, Disp: 90 tablet, Rfl: 0   icosapent Ethyl (VASCEPA) 1 g capsule, Take 2 capsules (2 g total) by mouth 2 (two) times daily., Disp: 360 capsule, Rfl: 3   LORazepam (ATIVAN) 0.5 MG tablet, lorazepam 0.5 mg tablet  Take 1 tablet 1 hour prior to flight. Repeat 1 time if needed., Disp: , Rfl:    metoprolol tartrate (LOPRESSOR) 25 MG tablet, TAKE 1 TABLET TWICE A DAY, Disp: 180 tablet, Rfl: 3   omeprazole (PRILOSEC) 40 MG capsule, TAKE 1 CAPSULE DAILY, Disp: 90 capsule, Rfl: 0   rosuvastatin (CRESTOR) 10 MG tablet, Take 1 tablet (10 mg total) by mouth daily., Disp: 90 tablet, Rfl: 3   sildenafil (VIAGRA) 100 MG tablet, Take 0.5-1 tablets (50-100 mg total) by mouth daily as needed for erectile dysfunction., Disp: 5 tablet, Rfl: 11   SYRINGE-NEEDLE, DISP, 3 ML (BD INTEGRA SYRINGE) 25G X 1" 3 ML MISC, Use with B12 injections monthly, Disp: 12 each, Rfl: 1   Vitamin D, Ergocalciferol, (DRISDOL) 1.25 MG (50000 UNIT) CAPS capsule, Take 1 capsule (50,000 Units total) by mouth every 7 (seven) days., Disp: 12 capsule, Rfl: 1  Current Facility-Administered Medications:    cyanocobalamin ((VITAMIN B-12)) injection 1,000 mcg, 1,000 mcg, Intramuscular, Q30 days, Fronie Holstein M, DO, 1,000 mcg at 05/28/20 1356  Allergies  Allergen Reactions   Depo-Medrol [Methylprednisolone  Sodium Succ] Rash     ROS: Review of Systems Pertinent items noted in HPI and remainder of comprehensive ROS otherwise negative.    Physical exam BP (!) 143/85   Pulse 85   Temp 97.9 F (36.6 C)   Ht '5\' 10"'  (1.778 m)   Wt 250 lb (113.4 kg)   SpO2 96%   BMI 35.87 kg/m  General appearance: alert, cooperative, appears stated age, no distress, and morbidly obese Head: Normocephalic, without obvious abnormality, atraumatic Eyes:  negative findings: lids and lashes normal, conjunctivae and sclerae normal, corneas clear, and pupils equal, round, reactive to light and accomodation Ears: normal TM's and external ear canals both ears Nose: Nares normal. Septum midline. Mucosa normal. No drainage or sinus tenderness. Throat: lips, mucosa, and tongue normal; teeth and gums normal Neck: no adenopathy, no carotid bruit, supple, symmetrical, trachea midline, and thyroid not enlarged, symmetric, no tenderness/mass/nodules Back: symmetric, no curvature. ROM normal. No CVA tenderness. Lungs: clear to auscultation bilaterally Chest wall: no tenderness Heart: regular rate and rhythm, S1, S2 normal, no murmur, click, rub or gallop Abdomen: soft, non-tender; bowel sounds normal; no masses,  no organomegaly Extremities: extremities normal, atraumatic, no cyanosis or edema Pulses: 2+ and symmetric Skin: Skin color, texture, turgor normal. No rashes or lesions Lymph nodes: Cervical, supraclavicular, and axillary nodes normal. Neurologic: Alert and oriented X 3, normal strength and tone. Normal symmetric reflexes. Normal coordination and gait Psych: Mood somewhat depressed.  Depression screen [facility] 2/9 03/27/2021 06/21/2020 05/28/2020  Decreased Interest 3 0 0  Down, Depressed, Hopeless 1 0 0  PHQ - 2 Score 4 0 0  Altered sleeping 3 - 0  Tired, decreased energy 3 - 0  Change in appetite 3 - 0  Feeling bad or failure about yourself  0 - 0  Trouble concentrating 1 - 0  Moving slowly or fidgety/restless 0 -  0  Suicidal thoughts 0 - 0  PHQ-9 Score 14 - 0  Difficult doing work/chores Somewhat difficult - -  Some recent data might be hidden   GAD 7 : Generalized Anxiety Score 03/27/2021 05/28/2020 04/06/2020 03/02/2020  Nervous, Anxious, on Edge 0 '1 1 2  ' Control/stop worrying 3 1 0 3  Worry too much - different things 3 1 0 3  Trouble relaxing 3 0 2 3  Restless 0 0 0 0  Easily annoyed or irritable 1 0 0 0  Afraid - awful might happen 0 0 0 0  Total GAD 7 Score '10 3 3 11  ' Anxiety Difficulty Somewhat difficult - - Somewhat difficult   Assessment/ Plan: Scott Rasmussen here for annual physical exam.   Annual physical exam  Thoracic aortic atherosclerosis ([place]) - Plan: CMP14+EGFR, Lipid panel, TSH, rosuvastatin (CRESTOR) 10 MG tablet, icosapent Ethyl (VASCEPA) 1 g capsule, DISCONTINUED: icosapent Ethyl (VASCEPA) 1 g capsule, DISCONTINUED: rosuvastatin (CRESTOR) 10 MG tablet  Hypertriglyceridemia - Plan: CMP14+EGFR, Lipid panel, TSH, rosuvastatin (CRESTOR) 10 MG tablet, metoprolol tartrate (LOPRESSOR) 25 MG tablet, icosapent Ethyl (VASCEPA) 1 g capsule, DISCONTINUED: icosapent Ethyl (VASCEPA) 1 g capsule, DISCONTINUED: metoprolol tartrate (LOPRESSOR) 25 MG tablet, DISCONTINUED: rosuvastatin (CRESTOR) 10 MG tablet  Former smoker - Plan: CBC, DG Chest 2 View  Asymptomatic microscopic hematuria - Plan: PSA, Urinalysis, Routine w reflex microscopic  New onset headache - Plan: Ambulatory referral to Neurology  Cough present for greater than 3 weeks - Plan: DG Chest 2 View  Encounter for hepatitis C screening test for low risk patient - Plan: Hepatitis C antibody  Screening for HIV without presence of risk factors - Plan: HIV antibody (with reflex)  Need for immunization against influenza - Plan: Flu Vaccine QUAD 67moIM (Fluarix, Fluzone & Alfiuria Quad PF)  Needs pneumococcal vaccination, hepatitis and HIV screening  He will come in for fasting labs.  Has known thoracic aortic atherosclerosis  and hypertriglyceridemia.  He will continue his medications but we will look for any metabolic derangements  Chest x-ray obtained given reports of coughing in this former smoker with recent cessation  4 weeks ago.  CBC also ordered  Has history of microscopic blood in urine.  He was seeing urology and they recommended annual urinalysis.  If greater than 2-3 red blood cells on urinalysis he is to be referred back to urology  Given his new onset headache with absence of neurologic findings I have referred him to neurology for further assessment.  Though I do suspect that his untreated sleep apnea and uncontrolled anxiety and depression likely are impacting his headaches.  Counseled on healthy lifestyle choices, including diet (rich in fruits, vegetables and lean meats and low in salt and simple carbohydrates) and exercise (at least 30 minutes of moderate physical activity daily).  Patient to follow up in 1 year for annual exam or sooner if needed.  Levonne Carreras M. Lajuana Ripple, DO

## 2021-03-28 ENCOUNTER — Other Ambulatory Visit: Payer: Self-pay | Admitting: Family Medicine

## 2021-03-28 ENCOUNTER — Other Ambulatory Visit: Payer: BC Managed Care – PPO

## 2021-03-28 DIAGNOSIS — I700 Atherosclerosis of aorta: Secondary | ICD-10-CM

## 2021-03-28 DIAGNOSIS — Z87891 Personal history of nicotine dependence: Secondary | ICD-10-CM

## 2021-03-28 DIAGNOSIS — R3121 Asymptomatic microscopic hematuria: Secondary | ICD-10-CM

## 2021-03-28 DIAGNOSIS — E781 Pure hyperglyceridemia: Secondary | ICD-10-CM | POA: Diagnosis not present

## 2021-03-28 DIAGNOSIS — Z114 Encounter for screening for human immunodeficiency virus [HIV]: Secondary | ICD-10-CM

## 2021-03-28 DIAGNOSIS — Z1159 Encounter for screening for other viral diseases: Secondary | ICD-10-CM | POA: Diagnosis not present

## 2021-03-28 DIAGNOSIS — R519 Headache, unspecified: Secondary | ICD-10-CM

## 2021-03-28 LAB — URINALYSIS, ROUTINE W REFLEX MICROSCOPIC
Bilirubin, UA: NEGATIVE
Glucose, UA: NEGATIVE
Ketones, UA: NEGATIVE
Leukocytes,UA: NEGATIVE
Nitrite, UA: NEGATIVE
Protein,UA: NEGATIVE
Specific Gravity, UA: 1.02 (ref 1.005–1.030)
Urobilinogen, Ur: 0.2 mg/dL (ref 0.2–1.0)
pH, UA: 6 (ref 5.0–7.5)

## 2021-03-28 LAB — MICROSCOPIC EXAMINATION: Bacteria, UA: NONE SEEN

## 2021-03-29 LAB — CMP14+EGFR
ALT: 38 IU/L (ref 0–44)
AST: 27 IU/L (ref 0–40)
Albumin/Globulin Ratio: 2 (ref 1.2–2.2)
Albumin: 4.8 g/dL (ref 3.8–4.9)
Alkaline Phosphatase: 74 IU/L (ref 44–121)
BUN/Creatinine Ratio: 17 (ref 9–20)
BUN: 16 mg/dL (ref 6–24)
Bilirubin Total: 0.6 mg/dL (ref 0.0–1.2)
CO2: 22 mmol/L (ref 20–29)
Calcium: 10 mg/dL (ref 8.7–10.2)
Chloride: 101 mmol/L (ref 96–106)
Creatinine, Ser: 0.94 mg/dL (ref 0.76–1.27)
Globulin, Total: 2.4 g/dL (ref 1.5–4.5)
Glucose: 98 mg/dL (ref 70–99)
Potassium: 4.4 mmol/L (ref 3.5–5.2)
Sodium: 139 mmol/L (ref 134–144)
Total Protein: 7.2 g/dL (ref 6.0–8.5)
eGFR: 97 mL/min/{1.73_m2} (ref >59)

## 2021-03-29 LAB — CBC
Hematocrit: 50.4 % (ref 37.5–51.0)
Hemoglobin: 17.7 g/dL (ref 13.0–17.7)
MCH: 29.6 pg (ref 26.6–33.0)
MCHC: 35.1 g/dL (ref 31.5–35.7)
MCV: 84 fL (ref 79–97)
Platelets: 262 10*3/uL (ref 150–450)
RBC: 5.98 x10E6/uL — ABNORMAL HIGH (ref 4.14–5.80)
RDW: 13 % (ref 11.6–15.4)
WBC: 6 10*3/uL (ref 3.4–10.8)

## 2021-03-29 LAB — HEPATITIS C ANTIBODY: Hep C Virus Ab: <0.1 s/co ratio (ref 0.0–0.9)

## 2021-03-29 LAB — LIPID PANEL
Chol/HDL Ratio: 4.2 ratio (ref 0.0–5.0)
Cholesterol, Total: 154 mg/dL (ref 100–199)
HDL: 37 mg/dL — ABNORMAL LOW (ref >39)
LDL Chol Calc (NIH): 58 mg/dL (ref 0–99)
Triglycerides: 386 mg/dL — ABNORMAL HIGH (ref 0–149)
VLDL Cholesterol Cal: 59 mg/dL — ABNORMAL HIGH (ref 5–40)

## 2021-03-29 LAB — HIV ANTIBODY (ROUTINE TESTING W REFLEX): HIV Screen 4th Generation wRfx: NONREACTIVE

## 2021-03-29 LAB — PSA: Prostate Specific Ag, Serum: 0.2 ng/mL (ref 0.0–4.0)

## 2021-03-29 LAB — TSH: TSH: 1.49 u[IU]/mL (ref 0.450–4.500)

## 2021-04-01 ENCOUNTER — Encounter: Payer: Self-pay | Admitting: Family Medicine

## 2021-04-01 DIAGNOSIS — M7521 Bicipital tendinitis, right shoulder: Secondary | ICD-10-CM | POA: Diagnosis not present

## 2021-04-01 DIAGNOSIS — S43431A Superior glenoid labrum lesion of right shoulder, initial encounter: Secondary | ICD-10-CM | POA: Diagnosis not present

## 2021-04-01 NOTE — Telephone Encounter (Signed)
Dr. Reece Agar,  I seen in last OV note about neurology referral but not about a medication for his HA's.  Would you like to prescribe something or does he need to wait on referral?

## 2021-04-02 NOTE — Telephone Encounter (Signed)
Recommend continued OTC analgesics until he can be evaluated by neurology.  I suspect that headaches are likely due to with stress and untreated sleep apnea however.

## 2021-04-09 ENCOUNTER — Encounter: Payer: Self-pay | Admitting: Family Medicine

## 2021-04-11 DIAGNOSIS — M7521 Bicipital tendinitis, right shoulder: Secondary | ICD-10-CM | POA: Diagnosis not present

## 2021-04-17 ENCOUNTER — Encounter: Payer: Self-pay | Admitting: Family Medicine

## 2021-04-18 ENCOUNTER — Ambulatory Visit (INDEPENDENT_AMBULATORY_CARE_PROVIDER_SITE_OTHER): Payer: BC Managed Care – PPO | Admitting: Family

## 2021-04-18 ENCOUNTER — Encounter: Payer: Self-pay | Admitting: Family

## 2021-04-18 VITALS — BP 141/92 | HR 91 | Temp 97.0°F | Ht 70.0 in | Wt 251.0 lb

## 2021-04-18 DIAGNOSIS — M7552 Bursitis of left shoulder: Secondary | ICD-10-CM

## 2021-04-18 MED ORDER — PREDNISONE 10 MG (21) PO TBPK: ORAL_TABLET | ORAL | 0 refills | Status: DC

## 2021-04-18 MED ORDER — DICLOFENAC SODIUM 75 MG PO TBEC: 75.0000 mg | DELAYED_RELEASE_TABLET | Freq: Two times a day (BID) | ORAL | 0 refills | Status: DC

## 2021-04-18 NOTE — Patient Instructions (Signed)
Bursitis °Bursitis is inflammation and irritation of a bursa, which is one of the small, fluid-filled sacs that cushion and protect the moving parts of your body. These sacs are located between bones and muscles, bones and muscle attachments, or bones and skin areas that are next to bones. A bursa protects those structures from the wear and tear that results from frequent movement. °An inflamed bursa causes pain and swelling. Fluid may build up inside the sac. Bursitis is most common near joints, especially the knees, elbows, hips, and shoulders. °What are the causes? °This condition may be caused by: °Injury from: °A direct hit (blow), like falling on your knee or elbow. °Overuse of a joint (repetitive stress). °Infection. This can happen if bacteria get into a bursa through a cut or scrape near a joint. °Diseases that cause joint inflammation, such as gout and rheumatoid arthritis. °What increases the risk? °You are more likely to develop this condition if you: °Have a job or hobby that involves a lot of repetitive stress on your joints. °Have a condition that weakens your body's defense system (immune system), such as diabetes, cancer, or HIV. °Do any of these often: °Lift and reach overhead. °Kneel or lean on hard surfaces. °Run or walk. °What are the signs or symptoms? °The most common symptoms of this condition include: °Pain that gets worse when you move the affected body part or use it to support (bear) your body weight. °Inflammation. °Stiffness. °Other symptoms include: °Redness. °Swelling. °Tenderness. °Warmth. °Pain that continues after rest. °Fever or chills. These may occur in bursitis that is caused by infection. °How is this diagnosed? °This condition may be diagnosed based on: °Your medical history and a physical exam. °Imaging tests, such as an MRI. °A procedure to drain fluid from the bursa with a needle (aspiration). The fluid may be checked for signs of infection or gout. °Blood tests to rule  out other causes of inflammation. °How is this treated? °This condition can usually be treated at home with rest, ice, applying pressure (compression), and raising the body part that is affected (elevation). This is called RICE therapy. For mild bursitis, RICE therapy may be all you need. Other treatments may include: °NSAIDs to treat pain and inflammation. °Corticosteroid medicines to fight inflammation. These medicines may be injected into and around the area of bursitis. °Aspiration of fluid from the bursa to relieve pain and improve movement. °Antibiotic medicine to treat an infected bursa. °A splint, brace, or walking aid, such as a cane. °Physical therapy if you continue to have pain or limited movement. °Surgery to remove a damaged or infected bursa. This may be needed if other treatments have not worked. °Follow these instructions at home: °Medicines °Take over-the-counter and prescription medicines only as told by your health care provider. °If you were prescribed an antibiotic medicine, take it as told by your health care provider. Do not stop taking the antibiotic even if you start to feel better. °General instructions ° °Rest the affected area as told by your health care provider. °If possible, raise (elevate) the affected area above the level of your heart while you are sitting or lying down. °Avoid activities that make pain worse. °Use splints, braces, pads, or walking aids as told by your health care provider. °If directed, put ice on the affected area: °If you have a removable splint or brace, remove it as told by your health care provider. °Put ice in a plastic bag. °Place a towel between your skin and the bag   or between your splint or brace and the bag. °Leave the ice on for 20 minutes, 2-3 times a day. °Keep all follow-up visits as told by your health care provider. This is important. °Preventing future episodes °Take actions to help prevent future episodes of bursitis. °Wear knee pads if you  kneel often. °Wear sturdy running or walking shoes that fit you well. °Take breaks regularly from repetitive activity. °Warm up by stretching before doing any activity that takes a lot of effort. °Maintain a healthy weight or lose weight as recommended by your health care provider. If you need help doing this, ask your health care provider. °Exercise regularly. Start any new physical activity gradually. °Contact a health care provider if you: °Have a fever. °Have chills. °Have bursitis that is not getting better with treatment or home care. °Summary °Bursitis is inflammation and irritation of a bursa, which is one of the small, fluid-filled sacs that cushion and protect the moving parts of your body. °An inflamed bursa causes pain and swelling. °Bursitis is commonly diagnosed with a physical exam, but other tests are sometimes needed. °This condition can usually be treated at home with rest, ice, applying pressure (compression), and raising the body part that is affected (elevation). This is called RICE therapy. °This information is not intended to replace advice given to you by your health care provider. Make sure you discuss any questions you have with your health care provider. °Document Revised: 09/14/2019 Document Reviewed: 10/12/2019 °Elsevier Patient Education © 2022 [employer] ° °

## 2021-04-18 NOTE — Progress Notes (Signed)
Subjective:    Patient ID: Scott Rasmussen, male    DOB: 1965/06/23, 55 y.o.   MRN: [id number]  Chief Complaint  Patient presents with   Arm Pain    Left arm sore since flu shot injection 3 weeks.     Arm Pain   Pt presents to the office today with sore left arm that started after he got a flu shot three weeks ago on 03/27/21. He reports mild constant aching pain of 6-7 out 10. He has used ice and motrin with mild relief. Denies any bruising, knot, heat, or fevers. No SOB or swelling.    Review of Systems  All other systems reviewed and are negative.     Objective:   Physical Exam Vitals reviewed.  Constitutional:      General: He is not in acute distress.    Appearance: He is well-developed.  HENT:     Head: Normocephalic.     Right Ear: Tympanic membrane normal.     Left Ear: Tympanic membrane normal.  Eyes:     General:        Right eye: No discharge.        Left eye: No discharge.     Pupils: Pupils are equal, round, and reactive to light.  Neck:     Thyroid: No thyromegaly.  Cardiovascular:     Rate and Rhythm: Normal rate and regular rhythm.     Heart sounds: Normal heart sounds. No murmur heard. Pulmonary:     Effort: Pulmonary effort is normal. No respiratory distress.     Breath sounds: Normal breath sounds. No wheezing.  Abdominal:     General: Bowel sounds are normal. There is no distension.     Palpations: Abdomen is soft.     Tenderness: There is no abdominal tenderness.  Musculoskeletal:        General: Tenderness present. Normal range of motion.     Cervical back: Normal range of motion and neck supple.     Comments: Left bicep tenderness with flexion and extension  Skin:    General: Skin is warm and dry.     Findings: No erythema or rash.  Neurological:     Mental Status: He is alert and oriented to person, place, and time.     Cranial Nerves: No cranial nerve deficit.     Deep Tendon Reflexes: Reflexes are normal and symmetric.  Psychiatric:         Behavior: Behavior normal.        Thought Content: Thought content normal.        Judgment: Judgment normal.      BP (!) 141/92    Pulse 91    Temp (!) 97 F (36.1 C) (Temporal)    Ht 5\' 10"  (1.778 m)    Wt 251 lb (113.9 kg)    BMI 36.01 kg/m      Assessment & Plan:  Scott Rasmussen comes in today with chief complaint of Arm Pain (Left arm sore since flu shot injection 3 weeks. )   Diagnosis and orders addressed:  1. Subdeltoid bursitis of left shoulder joint Rest Ice  ROM exercises encouraged Follow up if symptoms worsen or do not improve  - predniSONE (STERAPRED UNI-PAK 21 TAB) 10 MG (21) TBPK tablet; Use as directed  Dispense: 21 tablet; Refill: 0 - diclofenac (VOLTAREN) 75 MG EC tablet; Take 1 tablet (75 mg total) by mouth 2 (two) times daily.  Dispense: 60 tablet; Refill: 0  Jannifer Rodney, FNP

## 2021-04-23 NOTE — Progress Notes (Signed)
Assessment/Plan:   Headache Headaches are dramatically better, and patient states that he really would not want to take any medication for it right now. Neuro exam is nonfocal and nonlateralizing. He and I discussed MRI brain, but patient thinks no need for that right now.  If things get worse, we can reevaluate that. He and I discussed that headaches could be sleep apnea related.  They were initially waking him up from sleep.  He is going to be treated for that sleep apnea.  If headaches persist following that, we can certainly pursue treatment, although headaches are already much better.   Subjective:   Scott Rasmussen was seen today for headache.  My previous records as well as any outside records available were reviewed prior to todays visit.  Patient was seen 1 time about a year ago for perceived tremor and memory change, felt likely related to anxiety disorder.  He had neurocognitive testing with Dr. Roseanne Reno and has already reviewed those results with Dr. Roseanne Reno.  He had a normal neuropsychological exam.  He was referred today for a different reason, that being headache.  He states that when he went to the primary doctor the headaches were constant and daily but are now 2 days per week (been down to 2 days per week for about a month).  Headache is located over the left eyebrow and is burning and pulsating and sharp.  It was all day long and would wake him up but now its short lived - maybe 30 min.  He was taking mucinex without relief.   There is no photophobia or phonophobia.  There is no associated nausea or vomiting.  Medical records from primary care indicate that patient does currently have untreated sleep apnea.  He previously had sleep apnea surgery.  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  Neuroimaging has not previously been performed.  Headaches now not bad enough to take med for (they  were back in November).     CURRENT MEDICATIONS:  Outpatient Encounter Medications as of 04/25/2021  Medication Sig   buPROPion ER (WELLBUTRIN SR) 100 MG 12 hr tablet Take 1 tablet (100 mg total) by mouth 2 (two) times daily.   Coenzyme Q10 (COQ-10) 100 MG CAPS Take 100 mg by mouth. 1 Capsule Daily   diclofenac (VOLTAREN) 75 MG EC tablet Take 1 tablet (75 mg total) by mouth 2 (two) times daily.   DULoxetine (CYMBALTA) 60 MG capsule Take 1 capsule (60 mg total) by mouth daily.   fluticasone (FLONASE) 50 MCG/ACT nasal spray USE 2 SPRAYS IN EACH NOSTRIL DAILY   icosapent Ethyl (VASCEPA) 1 g capsule Take 2 capsules (2 g total) by mouth 2 (two) times daily.   LORazepam (ATIVAN) 0.5 MG tablet lorazepam 0.5 mg tablet  Take 1 tablet 1 hour prior to flight. Repeat 1 time if needed.   metoprolol tartrate (LOPRESSOR) 25 MG tablet Take 1 tablet (25 mg total) by mouth 2 (two) times daily.   omeprazole (PRILOSEC) 40 MG capsule TAKE 1 CAPSULE DAILY   rosuvastatin (CRESTOR) 10 MG tablet Take 1 tablet (10 mg total) by mouth daily.   sildenafil (VIAGRA) 100 MG tablet Take 0.5-1 tablets (50-100 mg total) by mouth daily as needed for erectile dysfunction.   Vitamin D, Ergocalciferol, (DRISDOL) 1.25 MG (50000 UNIT) CAPS capsule Take 1 capsule (50,000 Units total) by mouth every 7 (seven) days.  febuxostat (ULORIC) 40 MG tablet TAKE 1 TABLET DAILY   [DISCONTINUED] predniSONE (STERAPRED UNI-PAK 21 TAB) 10 MG (21) TBPK tablet Use as directed (Patient not taking: Reported on 04/25/2021)   Facility-Administered Encounter Medications as of 04/25/2021  Medication   cyanocobalamin ((VITAMIN B-12)) injection 1,000 mcg     Objective:   PHYSICAL EXAMINATION:    VITALS:   Vitals:   04/25/21 1530  BP: 132/81  Pulse: 73  SpO2: 95%  Weight: 251 lb (113.9 kg)  Height: 5\' 8"  (1.727 m)    GEN:  The patient appears stated age and is in NAD. HEENT:  Normocephalic, atraumatic.  The mucous membranes are moist. The  superficial temporal arteries are without ropiness or tenderness. CV:  RRR Lungs:  CTAB Neck/HEME:  There are no carotid bruits bilaterally.  Neurological examination:  Orientation: The patient is alert and oriented x3. Cranial nerves: There is good facial symmetry.The speech is fluent and clear. Soft palate rises symmetrically and there is no tongue deviation. Hearing is intact to conversational tone. Sensation: Sensation is intact to light touch throughout Motor: Strength is 5/5 in the upper and lower extremities.  Movement examination: Tone: There is normal tone in the UE/LE Abnormal movements:  no tremor.  No myoclonus.  No asterixis.   Coordination:  There is no decremation with RAM's.  There is no ataxia. Gait and Station: The patient has no difficulty arising out of a deep-seated chair without the use of the hands. The patient's stride length is good.  He is able to ambulate in a tandem fashion.    Total time spent on today's visit was 20 minutes, including both face-to-face time and nonface-to-face time.  Time included that spent on review of records (prior notes available to me/labs/imaging if pertinent), discussing treatment and goals, answering patient's questions and coordinating care.  Cc:  , DO

## 2021-04-25 ENCOUNTER — Other Ambulatory Visit: Payer: Self-pay

## 2021-04-25 ENCOUNTER — Encounter: Payer: Self-pay | Admitting: Family Medicine

## 2021-04-25 ENCOUNTER — Other Ambulatory Visit: Payer: Self-pay | Admitting: Family Medicine

## 2021-04-25 ENCOUNTER — Ambulatory Visit: Payer: BC Managed Care – PPO | Admitting: Neurology

## 2021-04-25 VITALS — BP 132/81 | HR 73 | Ht 68.0 in | Wt 251.0 lb

## 2021-04-25 DIAGNOSIS — R519 Headache, unspecified: Secondary | ICD-10-CM

## 2021-04-25 DIAGNOSIS — R5383 Other fatigue: Secondary | ICD-10-CM

## 2021-04-25 NOTE — Patient Instructions (Signed)
Let me know if your headaches return and we can do MRI brain, especially if your sleep apnea is treated and you still have the headache.

## 2021-04-26 ENCOUNTER — Other Ambulatory Visit: Payer: BC Managed Care – PPO

## 2021-04-26 DIAGNOSIS — R5383 Other fatigue: Secondary | ICD-10-CM

## 2021-04-29 LAB — CORTISOL-AM, BLOOD: Cortisol - AM: 13.3 ug/dL (ref 6.2–19.4)

## 2021-04-30 DIAGNOSIS — M7521 Bicipital tendinitis, right shoulder: Secondary | ICD-10-CM | POA: Diagnosis not present

## 2021-05-07 DIAGNOSIS — M7521 Bicipital tendinitis, right shoulder: Secondary | ICD-10-CM | POA: Diagnosis not present

## 2021-05-13 ENCOUNTER — Other Ambulatory Visit: Payer: Self-pay | Admitting: Family

## 2021-05-13 ENCOUNTER — Encounter: Payer: Self-pay | Admitting: Family Medicine

## 2021-05-13 MED ORDER — PREDNISONE 10 MG (21) PO TBPK: ORAL_TABLET | ORAL | 0 refills | Status: DC

## 2021-05-14 DIAGNOSIS — M7521 Bicipital tendinitis, right shoulder: Secondary | ICD-10-CM | POA: Diagnosis not present

## 2021-06-02 ENCOUNTER — Encounter: Payer: Self-pay | Admitting: Family Medicine

## 2021-06-03 ENCOUNTER — Ambulatory Visit (INDEPENDENT_AMBULATORY_CARE_PROVIDER_SITE_OTHER): Payer: BC Managed Care – PPO | Admitting: Family Medicine

## 2021-06-03 DIAGNOSIS — U071 COVID-19: Secondary | ICD-10-CM | POA: Diagnosis not present

## 2021-06-03 MED ORDER — ALBUTEROL SULFATE HFA 108 (90 BASE) MCG/ACT IN AERS: 2.0000 | INHALATION_SPRAY | Freq: Four times a day (QID) | RESPIRATORY_TRACT | 0 refills | Status: DC | PRN

## 2021-06-03 MED ORDER — MOLNUPIRAVIR EUA 200MG CAPSULE: 4.0000 | ORAL_CAPSULE | Freq: Two times a day (BID) | ORAL | 0 refills | Status: AC

## 2021-06-03 NOTE — Progress Notes (Signed)
Telephone visit  Subjective: [id number] PCP: [name], DO SPQ:[id number] Scott Rasmussen is a 56 y.o. male calls for telephone consult today. Patient provides verbal consent for consult held via phone.  Due to COVID-19 pandemic this visit was conducted virtually. This visit type was conducted due to national recommendations for restrictions regarding the COVID-19 Pandemic (e.g. social distancing, sheltering in place) in an effort to limit this patient's exposure and mitigate transmission in our community. All issues noted in this document were discussed and addressed.  A physical exam was not performed with this format.   Location of patient: home Location of provider: [facility] Others present for call: none  1. COVID Tested positive yesterday am.  He reports sinusitis, headache, nasal congestion.  No fever.  Reports myalgia/ arthralgia.  No shortness of breath.  He is using dayquil, motrin, flonase.  Is actively smoking again.   ROS: Per HPI  Allergies  Allergen Reactions   Depo-Medrol [Methylprednisolone Sodium Succ] Rash   Past Medical History:  Diagnosis Date   Gout    Hallux rigidus 10/11/2018   right    HTN (hypertension)    Hyperlipidemia    Shortness of breath    Sleep apnea    had sx    Umbilical hernia     Current Outpatient Medications:    buPROPion ER (WELLBUTRIN SR) 100 MG 12 hr tablet, Take 1 tablet (100 mg total) by mouth 2 (two) times daily., Disp: 90 tablet, Rfl: 3   Coenzyme Q10 (COQ-10) 100 MG CAPS, Take 100 mg by mouth. 1 Capsule Daily, Disp: , Rfl:    diclofenac (VOLTAREN) 75 MG EC tablet, Take 1 tablet (75 mg total) by mouth 2 (two) times daily., Disp: 60 tablet, Rfl: 0   DULoxetine (CYMBALTA) 60 MG capsule, Take 1 capsule (60 mg total) by mouth daily., Disp: 90 capsule, Rfl: 3   febuxostat (ULORIC) 40 MG tablet, TAKE 1 TABLET DAILY, Disp: 90 tablet, Rfl: 0   fluticasone (FLONASE) 50 MCG/ACT nasal spray, USE 2 SPRAYS IN EACH NOSTRIL DAILY, Disp: 48 g, Rfl:  3   icosapent Ethyl (VASCEPA) 1 g capsule, Take 2 capsules (2 g total) by mouth 2 (two) times daily., Disp: 360 capsule, Rfl: 3   LORazepam (ATIVAN) 0.5 MG tablet, lorazepam 0.5 mg tablet  Take 1 tablet 1 hour prior to flight. Repeat 1 time if needed., Disp: , Rfl:    metoprolol tartrate (LOPRESSOR) 25 MG tablet, Take 1 tablet (25 mg total) by mouth 2 (two) times daily., Disp: 180 tablet, Rfl: 3   omeprazole (PRILOSEC) 40 MG capsule, TAKE 1 CAPSULE DAILY, Disp: 90 capsule, Rfl: 0   rosuvastatin (CRESTOR) 10 MG tablet, Take 1 tablet (10 mg total) by mouth daily., Disp: 90 tablet, Rfl: 3   sildenafil (VIAGRA) 100 MG tablet, Take 0.5-1 tablets (50-100 mg total) by mouth daily as needed for erectile dysfunction., Disp: 5 tablet, Rfl: 11   Vitamin D, Ergocalciferol, (DRISDOL) 1.25 MG (50000 UNIT) CAPS capsule, Take 1 capsule (50,000 Units total) by mouth every 7 (seven) days., Disp: 12 capsule, Rfl: 1  Current Facility-Administered Medications:    cyanocobalamin ((VITAMIN B-12)) injection 1,000 mcg, 1,000 mcg, Intramuscular, Q30 days, [name] M, DO, 1,000 mcg at 05/28/20 1356  SpO2 93 %.  Assessment/ Plan: 56 y.o. male   COVID-19 - Plan: molnupiravir EUA (LAGEVRIO) 200 mg CAPS capsule, albuterol (VENTOLIN HFA) 108 (90 Base) MCG/ACT inhaler  Start Molnupiravir.  Albuterol inhaler sent in case he needs this.  May continue current  OTC medications.  May add Afrin nasal spray if needed for uncontrolled nasal congestion.  Push oral fluids.  We discussed red flag signs symptoms warranting further evaluation.  He voiced good understanding will follow up as needed  Start time: 11:01am End time: 11:06am  Total time spent on patient care (including telephone call/ virtual visit): 5 minutes  Aluel Schwarz Hulen Skains, DO [facility] [phone]

## 2021-06-07 ENCOUNTER — Other Ambulatory Visit: Payer: Self-pay | Admitting: Family Medicine

## 2021-06-23 ENCOUNTER — Encounter: Payer: Self-pay | Admitting: Family Medicine

## 2021-06-25 ENCOUNTER — Other Ambulatory Visit: Payer: Self-pay | Admitting: Family Medicine

## 2021-06-28 ENCOUNTER — Encounter: Payer: Self-pay | Admitting: Family Medicine

## 2021-06-28 ENCOUNTER — Ambulatory Visit: Payer: BC Managed Care – PPO | Admitting: Family Medicine

## 2021-06-28 DIAGNOSIS — J019 Acute sinusitis, unspecified: Secondary | ICD-10-CM | POA: Diagnosis not present

## 2021-06-28 DIAGNOSIS — B9689 Other specified bacterial agents as the cause of diseases classified elsewhere: Secondary | ICD-10-CM

## 2021-06-28 DIAGNOSIS — E781 Pure hyperglyceridemia: Secondary | ICD-10-CM | POA: Diagnosis not present

## 2021-06-28 DIAGNOSIS — I700 Atherosclerosis of aorta: Secondary | ICD-10-CM

## 2021-06-28 MED ORDER — WEGOVY 1.7 MG/0.75ML ~~LOC~~ SOAJ: 1.7000 mg | SUBCUTANEOUS | 0 refills | Status: DC

## 2021-06-28 MED ORDER — AMOXICILLIN-POT CLAVULANATE 875-125 MG PO TABS: 1.0000 | ORAL_TABLET | Freq: Two times a day (BID) | ORAL | 0 refills | Status: DC

## 2021-06-28 MED ORDER — WEGOVY 1 MG/0.5ML ~~LOC~~ SOAJ: 1.0000 mg | SUBCUTANEOUS | 0 refills | Status: DC

## 2021-06-28 MED ORDER — PROMETHAZINE-DM 6.25-15 MG/5ML PO SYRP: 2.5000 mL | ORAL_SOLUTION | Freq: Four times a day (QID) | ORAL | 0 refills | Status: DC | PRN

## 2021-06-28 MED ORDER — WEGOVY 0.5 MG/0.5ML ~~LOC~~ SOAJ: 0.5000 mg | SUBCUTANEOUS | 0 refills | Status: DC

## 2021-06-28 NOTE — Progress Notes (Signed)
? ?Subjective: ?CC: Sinusitis ?PCP: Raliegh IpGottschalk, Wahid Holley M, DO ?NWG:NFAOZHYHPI:Scott K Fayrene FearingJames is a 56 y.o. male presenting to clinic today for: ? ?1.  Sinusitis ?Patient reports that he has been battling sinus congestion, productive cough, drainage for 10 days now.  Symptoms seem to be getting progressively worse despite OTC medications.  He reports no hemoptysis, fevers, chills, myalgia or shortness of breath. ? ?2.  Obesity/ HLD ?Patient has continued to gain weight since he has been out of work.  His physical activity has been limited secondary to injuries of bilateral shoulders, [place]  He has cut out carbonated beverages and has changed his sweet tea to half-and-half.  He feels disgusted with how things are going.  He is treated for hyperlipidemia and aortic atherosclerosis with Crestor and Vascepa.  He is currently being treated for depression as well with Zoloft.  No personal history of pancreatitis.  No personal or family history of multiple endocrine type II neoplasia or medullary thyroid cancers ? ? ?ROS: Per HPI ? ?Allergies  ?Allergen Reactions  ? Depo-Medrol [Methylprednisolone Sodium Succ] Rash  ? ?Past Medical History:  ?Diagnosis Date  ? Gout   ? Hallux rigidus 10/11/2018  ? right   ? HTN (hypertension)   ? Hyperlipidemia   ? Shortness of breath   ? Sleep apnea   ? had sx   ? Umbilical hernia   ? ? ?Current Outpatient Medications:  ?  albuterol (VENTOLIN HFA) 108 (90 Base) MCG/ACT inhaler, Inhale 2 puffs into the lungs every 6 (six) hours as needed for wheezing or shortness of breath., Disp: 8 g, Rfl: 0 ?  buPROPion ER (WELLBUTRIN SR) 100 MG 12 hr tablet, Take 1 tablet (100 mg total) by mouth 2 (two) times daily., Disp: 90 tablet, Rfl: 3 ?  Coenzyme Q10 (COQ-10) 100 MG CAPS, Take 100 mg by mouth. 1 Capsule Daily, Disp: , Rfl:  ?  febuxostat (ULORIC) 40 MG tablet, TAKE 1 TABLET DAILY, Disp: 90 tablet, Rfl: 0 ?  fluticasone (FLONASE) 50 MCG/ACT nasal spray, USE 2 SPRAYS IN EACH NOSTRIL  DAILY, Disp: 48 g, Rfl: 3 ?  icosapent Ethyl (VASCEPA) 1 g capsule, Take 2 capsules (2 g total) by mouth 2 (two) times daily., Disp: 360 capsule, Rfl: 3 ?  metoprolol tartrate (LOPRESSOR) 25 MG tablet, Take 1 tablet (25 mg total) by mouth 2 (two) times daily., Disp: 180 tablet, Rfl: 3 ?  omeprazole (PRILOSEC) 40 MG capsule, TAKE 1 CAPSULE DAILY, Disp: 90 capsule, Rfl: 0 ?  rosuvastatin (CRESTOR) 10 MG tablet, Take 1 tablet (10 mg total) by mouth daily., Disp: 90 tablet, Rfl: 3 ?  sertraline (ZOLOFT) 50 MG tablet, Take 50 mg by mouth every morning., Disp: , Rfl:  ?  sildenafil (VIAGRA) 100 MG tablet, Take 0.5-1 tablets (50-100 mg total) by mouth daily as needed for erectile dysfunction., Disp: 5 tablet, Rfl: 11 ?  Vitamin D, Ergocalciferol, (DRISDOL) 1.25 MG (50000 UNIT) CAPS capsule, TAKE 1 CAPSULE EVERY 7 DAYS, Disp: 12 capsule, Rfl: 3 ?  LORazepam (ATIVAN) 0.5 MG tablet, lorazepam 0.5 mg tablet  Take 1 tablet 1 hour prior to flight. Repeat 1 time if needed. (Patient not taking: Reported on 06/28/2021), Disp: , Rfl:  ? ?Current Facility-Administered Medications:  ?  cyanocobalamin ((VITAMIN B-12)) injection 1,000 mcg, 1,000 mcg, Intramuscular, Q30 days, Delynn FlavinGottschalk, Yaremi Stahlman M, DO, 1,000 mcg at 05/28/20 1356 ?Social History  ? ?Socioeconomic History  ? Marital status: Married  ?  Spouse name: Not on  file  ? Number of children: Not on file  ? Years of education: Not on file  ? Highest education level: Not on file  ?Occupational History  ? Not on file  ?Tobacco Use  ? Smoking status: Some Days  ?  Packs/day: 1.00  ?  Years: 15.00  ?  Pack years: 15.00  ?  Types: Cigarettes  ?  Last attempt to quit: 2012  ?  Years since quitting: 11.1  ? Smokeless tobacco: Never  ?Vaping Use  ? Vaping Use: Never used  ?Substance and Sexual Activity  ? Alcohol use: Yes  ?  Comment: 2 drinks per month per pt.  ? Drug use: Not Currently  ?  Types: Marijuana  ? Sexual activity: Not on file  ?Other Topics Concern  ? Not on file  ?Social History  Narrative  ? Works at [employer]   ? ?Social Determinants of Health  ? ?Financial Resource Strain: Not on file  ?Food Insecurity: Not on file  ?Transportation Needs: Not on file  ?Physical Activity: Not on file  ?Stress: Not on file  ?Social Connections: Not on file  ?Intimate Partner Violence: Not on file  ? ?Family History  ?Problem Relation Age of Onset  ? Healthy Mother   ? Heart attack Father   ? Healthy Daughter   ? Healthy Son   ? Colon cancer Neg Hx   ? Esophageal cancer Neg Hx   ? Rectal cancer Neg Hx   ? Stomach cancer Neg Hx   ? ? ?Objective: ?Office vital signs reviewed. ?BP 128/83   Pulse 83   Temp (!) 97.1 ?F (36.2 ?C) (Temporal)   Ht 5\' 8"  (1.727 m)   Wt 247 lb 2 oz (112.1 kg)   SpO2 96%   BMI 37.58 kg/m?  ? ?Physical Examination:  ?General: Awake, alert, morbidly obese male, No acute distress ?HEENT: No gross drainage from the nares appreciated.  Sclera white. ?Cardio: regular rate and rhythm  ?Pulm: Normal work of breathing on room air.  Does not cough during appointment ?GI: Obese, protuberant ? ?Assessment/ Plan: ?56 y.o. male  ? ?Morbid obesity (HCC) - Plan: Semaglutide-Weight Management (WEGOVY) 0.5 MG/0.5ML SOAJ, Semaglutide-Weight Management (WEGOVY) 1 MG/0.5ML SOAJ, Semaglutide-Weight Management (WEGOVY) 1.7 MG/0.75ML SOAJ ? ?Thoracic aortic atherosclerosis (HCC) - Plan: Semaglutide-Weight Management (WEGOVY) 0.5 MG/0.5ML SOAJ, Semaglutide-Weight Management (WEGOVY) 1 MG/0.5ML SOAJ, Semaglutide-Weight Management (WEGOVY) 1.7 MG/0.75ML SOAJ ? ?Hypertriglyceridemia - Plan: Semaglutide-Weight Management (WEGOVY) 0.5 MG/0.5ML SOAJ, Semaglutide-Weight Management (WEGOVY) 1 MG/0.5ML SOAJ, Semaglutide-Weight Management (WEGOVY) 1.7 MG/0.75ML SOAJ ? ?Acute bacterial sinusitis - Plan: promethazine-dextromethorphan (PROMETHAZINE-DM) 6.25-15 MG/5ML syrup, amoxicillin-clavulanate (AUGMENTIN) 875-125 MG tablet ? ?I have given him a sample of 0.25 mg of the Wegovy.  No apparent  contraindications to use.  First injection administered today.  He is confirm with his insurance that they will cover this class medication with appropriate documentation so anticipated prior authorization will be needed.  Future doses have been sent.  He understands that he will escalate the dose each month.  He will follow-up with me in 3 months for recheck and interval weight checkup.   ? ?Patient has multiple comorbidities including thoracic aortic atherosclerosis, hypertriglyceridemia.  Continue cholesterol-lowering medications for now.  Continue modification in diet and exercise. ? ?I have given him medications for his acute bacterial sinusitis including Augmentin and promethazine with dextromethorphan.  Caution sedation.  Follow-up as needed ? ?No orders of the defined types were placed in this encounter. ? ?No orders of the defined types were  placed in this encounter. ? ? ? ?[name], DO ?[facility] ?[phone] ? ?

## 2021-07-03 DIAGNOSIS — S43431A Superior glenoid labrum lesion of right shoulder, initial encounter: Secondary | ICD-10-CM | POA: Diagnosis not present

## 2021-07-03 DIAGNOSIS — M7521 Bicipital tendinitis, right shoulder: Secondary | ICD-10-CM | POA: Diagnosis not present

## 2021-07-23 ENCOUNTER — Other Ambulatory Visit: Payer: Self-pay | Admitting: Family Medicine

## 2021-07-24 ENCOUNTER — Encounter: Payer: Self-pay | Admitting: Family Medicine

## 2021-07-24 ENCOUNTER — Telehealth: Payer: Self-pay

## 2021-07-24 ENCOUNTER — Other Ambulatory Visit: Payer: Self-pay | Admitting: Family Medicine

## 2021-07-24 DIAGNOSIS — E781 Pure hyperglyceridemia: Secondary | ICD-10-CM

## 2021-07-24 DIAGNOSIS — I700 Atherosclerosis of aorta: Secondary | ICD-10-CM

## 2021-07-24 MED ORDER — WEGOVY 0.5 MG/0.5ML ~~LOC~~ SOAJ: 0.5000 mg | SUBCUTANEOUS | 0 refills | Status: DC

## 2021-07-24 MED ORDER — WEGOVY 1.7 MG/0.75ML ~~LOC~~ SOAJ: 1.7000 mg | SUBCUTANEOUS | 0 refills | Status: DC

## 2021-07-24 MED ORDER — WEGOVY 1 MG/0.5ML ~~LOC~~ SOAJ: 1.0000 mg | SUBCUTANEOUS | 0 refills | Status: DC

## 2021-07-24 NOTE — Telephone Encounter (Signed)
Al Corpus (Key: [id number]) ?Rx #: [id number] ?Wegovy 0.5MG /0.5ML auto-injectors ?  ?Form ?[employer] Electronic PA Form [phone] NCPDP) ?Created ?2 hours ago ?Sent to Plan ?2 minutes ago ?Plan Response ?2 minutes ago ?Submit Clinical Questions ?less than a minute ago ?Determination ?Favorable ?less than a minute ago ?Your prior authorization for Reginal Lutes has been approved! ?MORE INFO ?Personalized support and financial assistance may be available through the [facility] [employer] program. For more information, and to see program requirements, click on the More Info button to the right. ? ?Message from plan: [id number];QASTMH:[id number];Review Type:Prior Auth;Coverage Start Date:06/24/2021;Coverage End Date:02/19/2022; ?

## 2021-07-25 ENCOUNTER — Encounter: Payer: Self-pay | Admitting: Family Medicine

## 2021-07-25 DIAGNOSIS — M7541 Impingement syndrome of right shoulder: Secondary | ICD-10-CM | POA: Diagnosis not present

## 2021-07-25 DIAGNOSIS — N521 Erectile dysfunction due to diseases classified elsewhere: Secondary | ICD-10-CM

## 2021-07-25 DIAGNOSIS — Y999 Unspecified external cause status: Secondary | ICD-10-CM | POA: Diagnosis not present

## 2021-07-25 DIAGNOSIS — M19011 Primary osteoarthritis, right shoulder: Secondary | ICD-10-CM | POA: Diagnosis not present

## 2021-07-25 DIAGNOSIS — G8918 Other acute postprocedural pain: Secondary | ICD-10-CM | POA: Diagnosis not present

## 2021-07-25 DIAGNOSIS — S46111A Strain of muscle, fascia and tendon of long head of biceps, right arm, initial encounter: Secondary | ICD-10-CM | POA: Diagnosis not present

## 2021-07-25 DIAGNOSIS — X58XXXA Exposure to other specified factors, initial encounter: Secondary | ICD-10-CM | POA: Diagnosis not present

## 2021-07-25 DIAGNOSIS — M7551 Bursitis of right shoulder: Secondary | ICD-10-CM | POA: Diagnosis not present

## 2021-07-25 DIAGNOSIS — M24111 Other articular cartilage disorders, right shoulder: Secondary | ICD-10-CM | POA: Diagnosis not present

## 2021-07-25 DIAGNOSIS — M13811 Other specified arthritis, right shoulder: Secondary | ICD-10-CM | POA: Diagnosis not present

## 2021-07-25 DIAGNOSIS — M66811 Spontaneous rupture of other tendons, right shoulder: Secondary | ICD-10-CM | POA: Diagnosis not present

## 2021-07-25 MED ORDER — LORATADINE 10 MG PO TABS: 10.0000 mg | ORAL_TABLET | Freq: Every day | ORAL | 2 refills | Status: DC

## 2021-07-25 MED ORDER — SILDENAFIL CITRATE 100 MG PO TABS: 50.0000 mg | ORAL_TABLET | Freq: Every day | ORAL | 11 refills | Status: DC | PRN

## 2021-08-01 DIAGNOSIS — M25511 Pain in right shoulder: Secondary | ICD-10-CM | POA: Diagnosis not present

## 2021-08-06 ENCOUNTER — Encounter: Payer: Self-pay | Admitting: Cardiovascular Disease

## 2021-08-07 DIAGNOSIS — M25511 Pain in right shoulder: Secondary | ICD-10-CM | POA: Diagnosis not present

## 2021-08-07 DIAGNOSIS — Z4789 Encounter for other orthopedic aftercare: Secondary | ICD-10-CM | POA: Diagnosis not present

## 2021-08-07 NOTE — Progress Notes (Signed)
?Cardiology Office Note:   ?Date:  08/09/2021  ?NAME:  Scott Rasmussen    ?MRN: [id number] ?DOB:  28-Apr-1965  ? ?PCP:  [name], DO  ?Cardiologist:  Reatha Harps, MD  ?Electrophysiologist:  None  ? ?Referring MD: [name], DO  ? ?Chief Complaint  ?Patient presents with  ? Follow-up  ?   ?  ? ?History of Present Illness:   ?Scott Rasmussen is a 56 y.o. male with a hx of CAD, hypertension, hyperlipidemia who presents for follow-up.  He reports he is doing well.  Still needs to work on his triglycerides.  He has been started on Wegovy by his primary care physician.  Also just received a sleep machine.  He does have sleep apnea.  Suspect this will help.  Weight is elevated.  He has plans to exercise and diet a bit more.  Blood pressure is well controlled.  No symptoms of angina. ? ?Problem List ?1. Covid-19 PNA ?2. CAD (mild ischemia in basal to mid anterior wall, intermediate risk, NM SPECT 03/15/2019) ?-CAC score 0 2015 ?3. HLD  ?-T chol 154, HDL 37, LDL 58, triglycerides 386 ?4. Former tobacco abuse ?5. OSA ?-moderate ?6. HTN ? ?Past Medical History: ?Past Medical History:  ?Diagnosis Date  ? Gout   ? Hallux rigidus 10/11/2018  ? right   ? HTN (hypertension)   ? Hyperlipidemia   ? Shortness of breath   ? Sleep apnea   ? had sx   ? Umbilical hernia   ? ? ?Past Surgical History: ?Past Surgical History:  ?Procedure Laterality Date  ? TONSILLECTOMY AND ADENOIDECTOMY  2008  ? UVULOPALATOPHARYNGOPLASTY  2008  ? ? ?Current Medications: ?Current Meds  ?Medication Sig  ? albuterol (VENTOLIN HFA) 108 (90 Base) MCG/ACT inhaler Inhale 2 puffs into the lungs every 6 (six) hours as needed for wheezing or shortness of breath.  ? amoxicillin-clavulanate (AUGMENTIN) 875-125 MG tablet Take 1 tablet by mouth 2 (two) times daily.  ? buPROPion ER (WELLBUTRIN SR) 100 MG 12 hr tablet Take 1 tablet (100 mg total) by mouth 2 (two) times daily.  ? Coenzyme Q10 (COQ-10) 100 MG CAPS Take 100 mg by mouth. 1 Capsule Daily   ? febuxostat (ULORIC) 40 MG tablet TAKE 1 TABLET DAILY  ? fluticasone (FLONASE) 50 MCG/ACT nasal spray USE 2 SPRAYS IN EACH NOSTRIL DAILY  ? icosapent Ethyl (VASCEPA) 1 g capsule Take 2 capsules (2 g total) by mouth 2 (two) times daily.  ? loratadine (CLARITIN) 10 MG tablet Take 1 tablet (10 mg total) by mouth daily.  ? LORazepam (ATIVAN) 0.5 MG tablet   ? metoprolol tartrate (LOPRESSOR) 25 MG tablet Take 1 tablet (25 mg total) by mouth 2 (two) times daily.  ? omeprazole (PRILOSEC) 40 MG capsule TAKE 1 CAPSULE DAILY  ? rosuvastatin (CRESTOR) 10 MG tablet Take 1 tablet (10 mg total) by mouth daily.  ? Semaglutide-Weight Management (WEGOVY) 0.5 MG/0.5ML SOAJ Inject 0.5 mg into the skin every 7 (seven) days. Month #2  ? Semaglutide-Weight Management (WEGOVY) 1 MG/0.5ML SOAJ Inject 1 mg into the skin every 7 (seven) days. Month #3  ? Semaglutide-Weight Management (WEGOVY) 1.7 MG/0.75ML SOAJ Inject 1.7 mg into the skin every 7 (seven) days. Month#4  ? sildenafil (VIAGRA) 100 MG tablet Take 0.5-1 tablets (50-100 mg total) by mouth daily as needed for erectile dysfunction.  ? Vitamin D, Ergocalciferol, (DRISDOL) 1.25 MG (50000 UNIT) CAPS capsule TAKE 1 CAPSULE EVERY 7 DAYS  ? ?Current Facility-Administered  Medications for the 08/09/21 encounter (Office Visit) with Sande Rives, MD  ?Medication  ? cyanocobalamin ((VITAMIN B-12)) injection 1,000 mcg  ?  ? ?Allergies:    ?Depo-medrol [methylprednisolone sodium succ]  ? ?Social History: ?Social History  ? ?Socioeconomic History  ? Marital status: Married  ?  Spouse name: Not on file  ? Number of children: Not on file  ? Years of education: Not on file  ? Highest education level: Not on file  ?Occupational History  ? Not on file  ?Tobacco Use  ? Smoking status: Some Days  ?  Packs/day: 1.00  ?  Years: 15.00  ?  Pack years: 15.00  ?  Types: Cigarettes  ?  Last attempt to quit: 2012  ?  Years since quitting: 11.3  ? Smokeless tobacco: Never  ?Vaping Use  ? Vaping Use:  Never used  ?Substance and Sexual Activity  ? Alcohol use: Yes  ?  Comment: 2 drinks per month per pt.  ? Drug use: Not Currently  ?  Types: Marijuana  ? Sexual activity: Not on file  ?Other Topics Concern  ? Not on file  ?Social History Narrative  ? Works at [employer]   ? ?Social Determinants of Health  ? ?Financial Resource Strain: Not on file  ?Food Insecurity: Not on file  ?Transportation Needs: Not on file  ?Physical Activity: Not on file  ?Stress: Not on file  ?Social Connections: Not on file  ?  ? ?Family History: ?The patient's family history includes Healthy in his daughter, mother, and son; Heart attack in his father. There is no history of Colon cancer, Esophageal cancer, Rectal cancer, or Stomach cancer. ? ?ROS:   ?All other ROS reviewed and negative. Pertinent positives noted in the HPI.    ? ?EKGs/Labs/Other Studies Reviewed:   ?The following studies were personally reviewed by me today: ? ? ?Recent Labs: ?03/28/2021: ALT 38; BUN 16; Creatinine, Ser 0.94; Hemoglobin 17.7; Platelets 262; Potassium 4.4; Sodium 139; TSH 1.490  ? ?Recent Lipid Panel ?   ?Component Value Date/Time  ? CHOL 154 03/28/2021 1111  ? TRIG 386 (H) 03/28/2021 1111  ? TRIG 242 (H) 10/17/2016 0806  ? HDL 37 (L) 03/28/2021 1111  ? HDL 39 (L) 10/17/2016 0806  ? CHOLHDL 4.2 03/28/2021 1111  ? LDLCALC 58 03/28/2021 1111  ? LDLCALC 86 11/28/2013 1024  ? LDLDIRECT 59 07/03/2020 1108  ? ? ?Physical Exam:   ?VS:  BP 134/90   Pulse 79   Ht 5\' 10"  (1.778 m)   Wt 247 lb 12.8 oz (112.4 kg)   SpO2 97%   BMI 35.56 kg/m?    ?Wt Readings from Last 3 Encounters:  ?08/09/21 247 lb 12.8 oz (112.4 kg)  ?06/28/21 247 lb 2 oz (112.1 kg)  ?04/25/21 251 lb (113.9 kg)  ?  ?General: Well nourished, well developed, in no acute distress ?Head: Atraumatic, normal size  ?Eyes: PEERLA, EOMI  ?Neck: Supple, no JVD ?Endocrine: No thryomegaly ?Cardiac: Normal S1, S2; RRR; no murmurs, rubs, or gallops ?Lungs: Clear to auscultation bilaterally, no wheezing,  rhonchi or rales  ?Abd: Soft, nontender, no hepatomegaly  ?Ext: No edema, pulses 2+ ?Musculoskeletal: No deformities, BUE and BLE strength normal and equal ?Skin: Warm and dry, no rashes   ?Neuro: Alert and oriented to person, place, time, and situation, CNII-XII grossly intact, no focal deficits  ?Psych: Normal mood and affect  ? ?ASSESSMENT:   ?Scott Rasmussen is a 56 y.o. male who presents for  the following: ?1. Coronary artery disease involving native coronary artery of native heart without angina pectoris   ?2. Mixed hyperlipidemia   ?3. Obstructive sleep apnea (adult) (pediatric)   ? ? ?PLAN:   ?1. Coronary artery disease involving native coronary artery of native heart without angina pectoris ?2. Mixed hyperlipidemia ?-Mildly abnormal stress test 2021.  No symptoms of angina.  We have opted for medical therapy.  On metoprolol.  Would continue aspirin as well as Vascepa and Crestor.  Cannot tolerate higher doses of Crestor.  Would recommend weight loss as well as exercise.  He will work on this.  Echocardiogram shows normal LV function.  He will continue to see us back yearly. ? ?3. Obstructive sleep apnea (adult) (pediatric) ?-Recently received his CPAP.  Should continue with this. ? ? ?  ? ?Disposition: No follow-ups on file. ? ?Medication Adjustments/Labs and Tests Ordered: ?Current medicines are reviewed at length with the patient today.  Concerns regarding medicines are outlined above.  ?No orders of the defined types were placed in this encounter. ? ?No orders of the defined types were placed in this encounter. ? ? ?Patient Instructions  ?Medication Instructions:  ?The current medical regimen is effective;  continue present plan and medications. ? ?*If you need a refill on your cardiac medications before your next appointment, please call your pharmacy* ? ? ?Follow-Up: ?At [facility], you and your health needs are our priority.  As part of our continuing mission to provide you with exceptional heart  care, we have created designated Provider Care Teams.  These Care Teams include your primary Cardiologist (physician) and Advanced Practice Providers (APPs -  Physician Assistants and Nurse Practitioners) who

## 2021-08-09 ENCOUNTER — Encounter: Payer: Self-pay | Admitting: Cardiovascular Disease

## 2021-08-09 ENCOUNTER — Ambulatory Visit: Payer: BC Managed Care – PPO | Admitting: Cardiovascular Disease

## 2021-08-09 VITALS — BP 134/90 | HR 79 | Ht 70.0 in | Wt 247.8 lb

## 2021-08-09 DIAGNOSIS — G4733 Obstructive sleep apnea (adult) (pediatric): Secondary | ICD-10-CM

## 2021-08-09 DIAGNOSIS — E782 Mixed hyperlipidemia: Secondary | ICD-10-CM

## 2021-08-09 DIAGNOSIS — I2510 Atherosclerotic heart disease of native coronary artery without angina pectoris: Secondary | ICD-10-CM | POA: Diagnosis not present

## 2021-08-09 NOTE — Patient Instructions (Signed)
Medication Instructions:  ?The current medical regimen is effective;  continue present plan and medications. ? ?*If you need a refill on your cardiac medications before your next appointment, please call your pharmacy* ? ? ?Follow-Up: ?At [facility], you and your health needs are our priority.  As part of our continuing mission to provide you with exceptional heart care, we have created designated Provider Care Teams.  These Care Teams include your primary Cardiologist (physician) and Advanced Practice Providers (APPs -  Physician Assistants and Nurse Practitioners) who all work together to provide you with the care you need, when you need it. ? ?We recommend signing up for the patient portal called "MyChart".  Sign up information is provided on this After Visit Summary.  MyChart is used to connect with patients for Virtual Visits (Telemedicine).  Patients are able to view lab/test results, encounter notes, upcoming appointments, etc.  Non-urgent messages can be sent to your provider as well.   ?To learn more about what you can do with MyChart, go to [email or web address].   ? ?Your next appointment:   ?12 month(s) ? ?The format for your next appointment:   ?In Person ? ?Provider:   ?Evalina Field, MD   ? ?Important Information About Sugar ? ? ? ? ? ? ?

## 2021-08-14 ENCOUNTER — Encounter: Payer: Self-pay | Admitting: Family Medicine

## 2021-08-14 DIAGNOSIS — M25511 Pain in right shoulder: Secondary | ICD-10-CM | POA: Diagnosis not present

## 2021-08-21 DIAGNOSIS — M25511 Pain in right shoulder: Secondary | ICD-10-CM | POA: Diagnosis not present

## 2021-08-23 IMAGING — DX DG FINGER RING 2+V*R*
3 series · 3 of 3 positions shown · non-contrast
Comparison: None.

CLINICAL DATA: Right fourth digit pain

EXAM:
RIGHT RING FINGER 2+V

[finger ap]
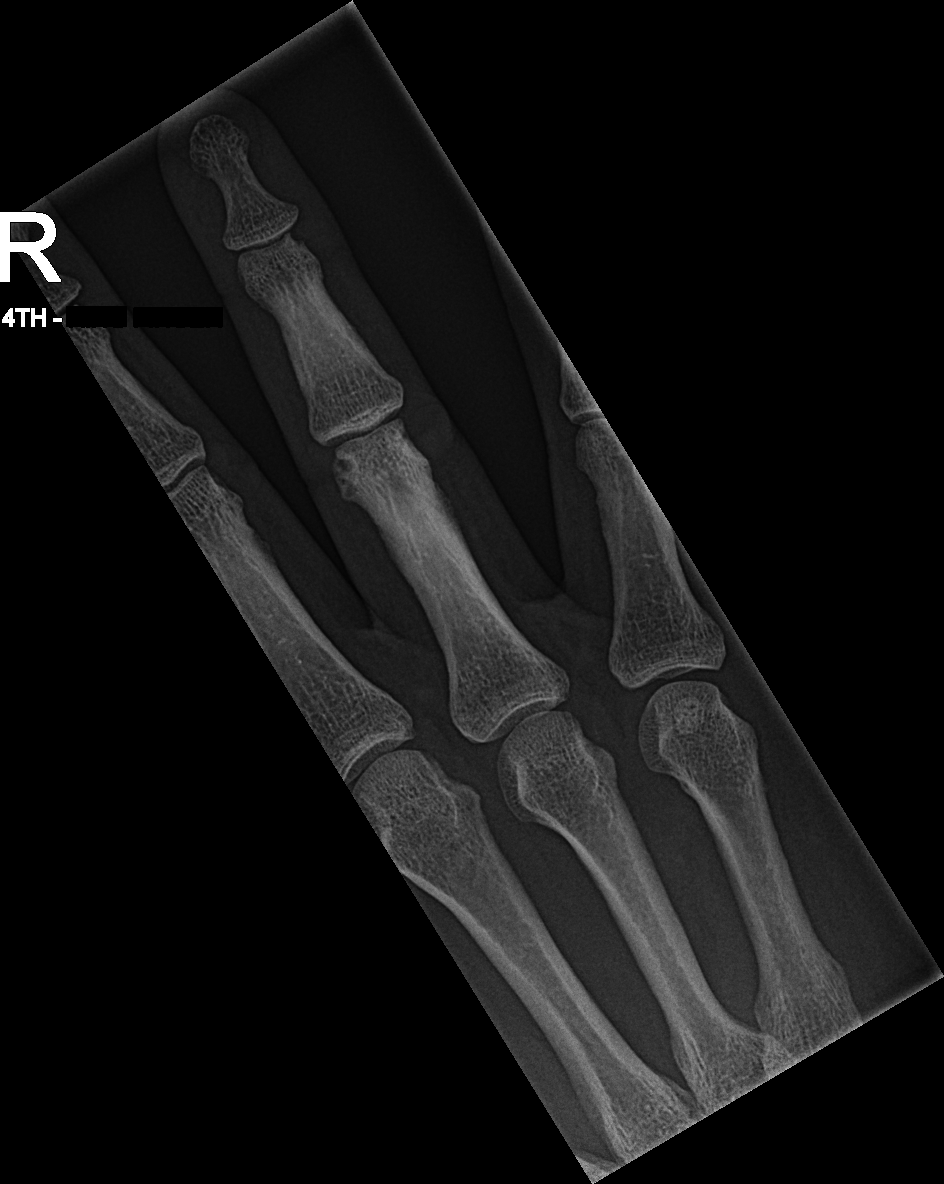

[finger obl]
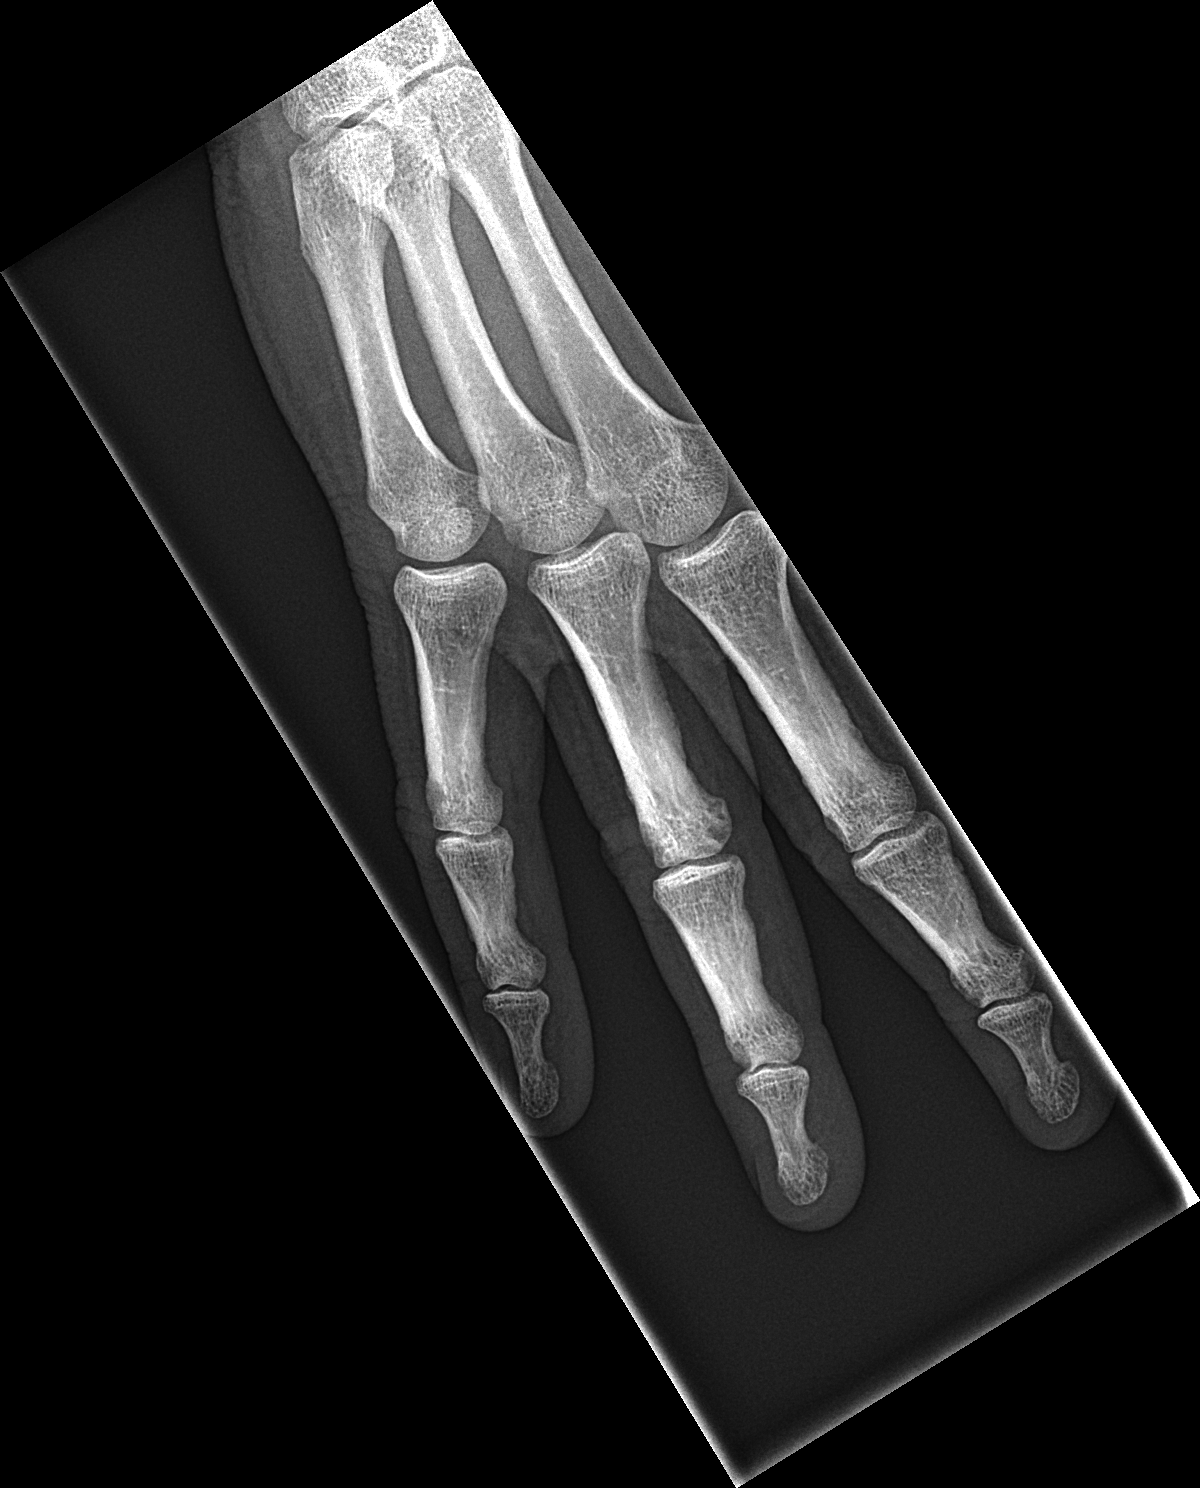

[finger lat]
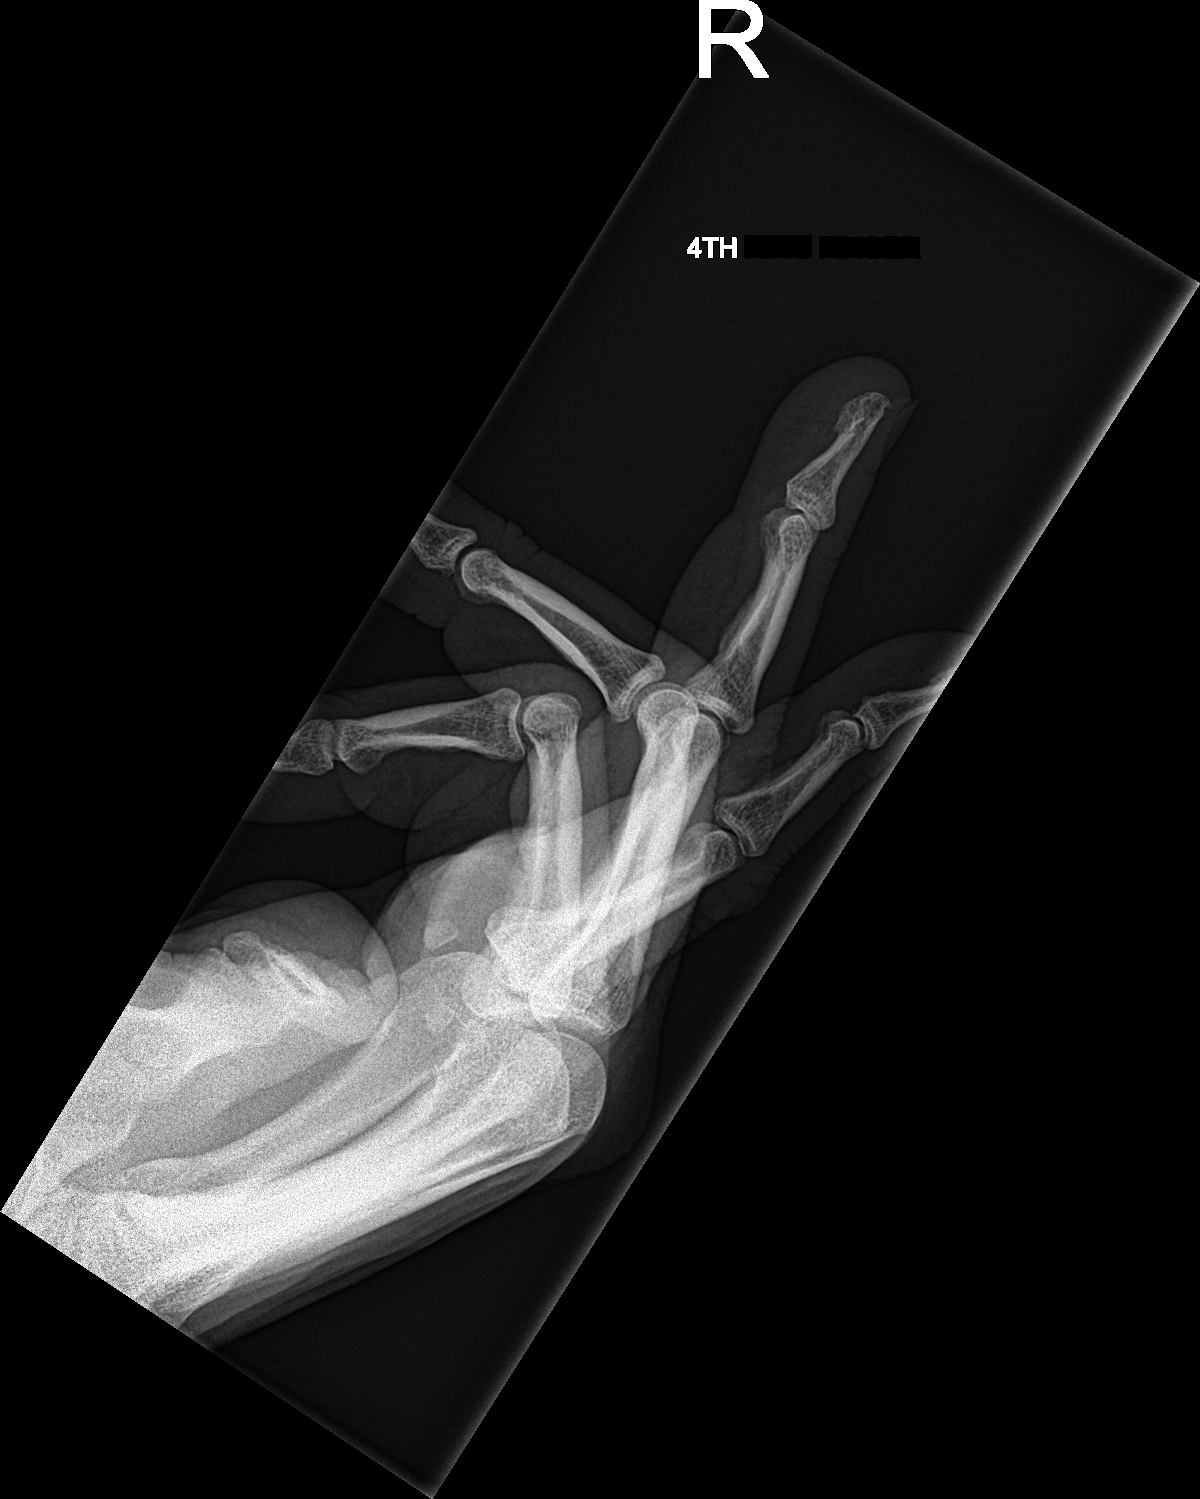

[3 of 3 positions shown; findings below may reference images not displayed]

FINDINGS: Frontal, oblique, and lateral views of the right fourth digit are
obtained. No fracture, subluxation, or dislocation. Mild
osteoarthritis of the fourth proximal interphalangeal joint. Soft
tissues are unremarkable.
IMPRESSION: 1. Mild osteoarthritis of the fourth PIP joint.  No acute fracture.

## 2021-08-28 DIAGNOSIS — M25511 Pain in right shoulder: Secondary | ICD-10-CM | POA: Diagnosis not present

## 2021-08-29 ENCOUNTER — Encounter: Payer: Self-pay | Admitting: Family Medicine

## 2021-09-04 DIAGNOSIS — M25511 Pain in right shoulder: Secondary | ICD-10-CM | POA: Diagnosis not present

## 2021-09-05 ENCOUNTER — Encounter: Payer: Self-pay | Admitting: Family Medicine

## 2021-09-05 ENCOUNTER — Other Ambulatory Visit: Payer: Self-pay | Admitting: Family Medicine

## 2021-09-05 DIAGNOSIS — I700 Atherosclerosis of aorta: Secondary | ICD-10-CM

## 2021-09-05 DIAGNOSIS — I2510 Atherosclerotic heart disease of native coronary artery without angina pectoris: Secondary | ICD-10-CM

## 2021-09-05 DIAGNOSIS — E781 Pure hyperglyceridemia: Secondary | ICD-10-CM

## 2021-09-05 DIAGNOSIS — E8881 Metabolic syndrome: Secondary | ICD-10-CM

## 2021-09-05 MED ORDER — OZEMPIC (0.25 OR 0.5 MG/DOSE) 2 MG/3ML ~~LOC~~ SOPN
0.5000 mg | PEN_INJECTOR | SUBCUTANEOUS | 3 refills | Status: DC
Start: 2021-09-05 — End: 2021-09-06

## 2021-09-06 ENCOUNTER — Other Ambulatory Visit: Payer: Self-pay | Admitting: Family Medicine

## 2021-09-06 ENCOUNTER — Encounter: Payer: Self-pay | Admitting: Family Medicine

## 2021-09-06 DIAGNOSIS — I700 Atherosclerosis of aorta: Secondary | ICD-10-CM

## 2021-09-06 DIAGNOSIS — E8881 Metabolic syndrome: Secondary | ICD-10-CM

## 2021-09-06 DIAGNOSIS — I2510 Atherosclerotic heart disease of native coronary artery without angina pectoris: Secondary | ICD-10-CM

## 2021-09-06 DIAGNOSIS — M25511 Pain in right shoulder: Secondary | ICD-10-CM | POA: Diagnosis not present

## 2021-09-06 DIAGNOSIS — E781 Pure hyperglyceridemia: Secondary | ICD-10-CM

## 2021-09-06 MED ORDER — SEMAGLUTIDE (2 MG/DOSE) 8 MG/3ML ~~LOC~~ SOPN
2.0000 mg | PEN_INJECTOR | SUBCUTANEOUS | 3 refills | Status: DC
Start: 2021-09-06 — End: 2022-04-11

## 2021-09-06 NOTE — Telephone Encounter (Signed)
done

## 2021-09-06 NOTE — Progress Notes (Signed)
Spoke to patient on phone and he is on the 1 mg of the [facility].  Apparently his insurance is all of a sudden going to stop covering this medicine given that has been getting it 30-month supplies this entire time.  Very unfortunate but they have said that they will cover Ozempic for him given his cardiovascular disease and for this reason I have sent in the next available titration dose of the Ozempic of 2 mg.  Caution GI side effects since technically the weight loss version is 1.7 mg next dose  Meds ordered this encounter  Medications   Semaglutide, 2 MG/DOSE, 8 MG/3ML SOPN    Sig: Inject 2 mg as directed once a week.    Dispense:  9 mL    Refill:  3

## 2021-09-08 DIAGNOSIS — G4733 Obstructive sleep apnea (adult) (pediatric): Secondary | ICD-10-CM | POA: Diagnosis not present

## 2021-09-09 ENCOUNTER — Other Ambulatory Visit: Payer: Self-pay | Admitting: Family Medicine

## 2021-09-09 ENCOUNTER — Other Ambulatory Visit: Payer: Self-pay | Admitting: Cardiovascular Disease

## 2021-09-09 ENCOUNTER — Other Ambulatory Visit: Payer: Self-pay

## 2021-09-09 ENCOUNTER — Encounter: Payer: Self-pay | Admitting: Family Medicine

## 2021-09-09 DIAGNOSIS — I700 Atherosclerosis of aorta: Secondary | ICD-10-CM

## 2021-09-09 DIAGNOSIS — E781 Pure hyperglyceridemia: Secondary | ICD-10-CM

## 2021-09-09 DIAGNOSIS — R058 Other specified cough: Secondary | ICD-10-CM

## 2021-09-09 MED ORDER — FAMOTIDINE 20 MG PO TABS: 20.0000 mg | ORAL_TABLET | Freq: Two times a day (BID) | ORAL | 2 refills | Status: AC

## 2021-09-09 MED ORDER — PROMETHAZINE-DM 6.25-15 MG/5ML PO SYRP: 1.2500 mL | ORAL_SOLUTION | Freq: Four times a day (QID) | ORAL | 0 refills | Status: DC | PRN

## 2021-09-10 ENCOUNTER — Encounter: Payer: Self-pay | Admitting: Cardiovascular Disease

## 2021-09-11 ENCOUNTER — Telehealth: Payer: Self-pay | Admitting: *Deleted

## 2021-09-11 DIAGNOSIS — M25511 Pain in right shoulder: Secondary | ICD-10-CM | POA: Diagnosis not present

## 2021-09-11 NOTE — Telephone Encounter (Signed)
Patient sent a message via mychart asking if surgery to treat his OSA is an option. He states that he has had uvular surgery in the past to treat his OSA about 15 years ago. He has appointment to see Dr Tresa Endo on 09/24/21. I recommended that he discuss this with Dr Tresa Endo at that visit. Patient agres with the plan.

## 2021-09-12 ENCOUNTER — Ambulatory Visit: Payer: BC Managed Care – PPO | Admitting: Cardiovascular Disease

## 2021-09-15 ENCOUNTER — Encounter: Payer: Self-pay | Admitting: Cardiovascular Disease

## 2021-09-15 DIAGNOSIS — E781 Pure hyperglyceridemia: Secondary | ICD-10-CM

## 2021-09-15 DIAGNOSIS — I700 Atherosclerosis of aorta: Secondary | ICD-10-CM

## 2021-09-17 MED ORDER — ICOSAPENT ETHYL 1 G PO CAPS: 2.0000 g | ORAL_CAPSULE | Freq: Two times a day (BID) | ORAL | 3 refills | Status: DC

## 2021-09-18 DIAGNOSIS — M25511 Pain in right shoulder: Secondary | ICD-10-CM | POA: Diagnosis not present

## 2021-09-19 DIAGNOSIS — M5416 Radiculopathy, lumbar region: Secondary | ICD-10-CM | POA: Diagnosis not present

## 2021-09-20 DIAGNOSIS — M25511 Pain in right shoulder: Secondary | ICD-10-CM | POA: Diagnosis not present

## 2021-09-23 ENCOUNTER — Other Ambulatory Visit: Payer: Self-pay | Admitting: Family Medicine

## 2021-09-24 DIAGNOSIS — M25511 Pain in right shoulder: Secondary | ICD-10-CM | POA: Diagnosis not present

## 2021-09-25 ENCOUNTER — Ambulatory Visit: Payer: BC Managed Care – PPO | Admitting: Cardiovascular Disease

## 2021-09-27 DIAGNOSIS — M25511 Pain in right shoulder: Secondary | ICD-10-CM | POA: Diagnosis not present

## 2021-10-09 DIAGNOSIS — G4733 Obstructive sleep apnea (adult) (pediatric): Secondary | ICD-10-CM | POA: Diagnosis not present

## 2021-10-21 DIAGNOSIS — M5459 Other low back pain: Secondary | ICD-10-CM | POA: Diagnosis not present

## 2021-10-23 DIAGNOSIS — M79644 Pain in right finger(s): Secondary | ICD-10-CM | POA: Diagnosis not present

## 2021-10-31 ENCOUNTER — Ambulatory Visit: Payer: BC Managed Care – PPO | Admitting: Gastroenterology

## 2021-10-31 ENCOUNTER — Encounter: Payer: Self-pay | Admitting: Gastroenterology

## 2021-10-31 VITALS — BP 154/90 | HR 98 | Ht 70.0 in | Wt 239.0 lb

## 2021-10-31 DIAGNOSIS — K219 Gastro-esophageal reflux disease without esophagitis: Secondary | ICD-10-CM

## 2021-10-31 DIAGNOSIS — R142 Eructation: Secondary | ICD-10-CM | POA: Diagnosis not present

## 2021-10-31 NOTE — Progress Notes (Signed)
10/31/2021 Scott Rasmussen [id number] 06-08-1965   HISTORY OF PRESENT ILLNESS:  This is a 56 year old male who is a patient of Dr. Irving Burton, known to him for colonoscopy.  He has a longstanding history of acid reflux for which he has been on omeprazole 40 mg daily for 25 to 30 years.  Remembers having an EGD with dilation in the 90s.  Now with new symptoms of belching and a metallic taste in his mouth for the past 2-3 months.  He recently started CPAP machine and the symptoms began after that.  Also recently started [facility] and then switched to Ozempic.  Overall feels like his heartburn/reflux is well controlled.  He had 1 episode, sounds like when he first started the CPAP machine our just before that, when he woke up choking from regurgitation in the middle the night.  His PCP recently added Pepcid 20 mg twice daily about a month ago, but has not noticed much difference since adding that.  No dysphagia.   Past Medical History:  Diagnosis Date   Gout    Hallux rigidus 10/11/2018   right    HTN (hypertension)    Hyperlipidemia    Shortness of breath    Sleep apnea    had sx    Umbilical hernia    Past Surgical History:  Procedure Laterality Date   TONSILLECTOMY AND ADENOIDECTOMY  2008   UVULOPALATOPHARYNGOPLASTY  2008    reports that he has been smoking cigarettes. He has a 15.00 pack-year smoking history. He has never used smokeless tobacco. He reports current alcohol use. He reports that he does not currently use drugs after having used the following drugs: Marijuana. family history includes Healthy in his daughter, mother, and son; Heart attack in his father. Allergies  Allergen Reactions   Depo-Medrol [Methylprednisolone Sodium Succ] Rash      Outpatient Encounter Medications as of 10/31/2021  Medication Sig   buPROPion ER (WELLBUTRIN SR) 100 MG 12 hr tablet Take 1 tablet (100 mg total) by mouth 2 (two) times daily.   famotidine (PEPCID) 20 MG tablet Take 1 tablet (20 mg  total) by mouth 2 (two) times daily.   febuxostat (ULORIC) 40 MG tablet TAKE 1 TABLET DAILY   fluticasone (FLONASE) 50 MCG/ACT nasal spray USE 2 SPRAYS IN EACH NOSTRIL DAILY   icosapent Ethyl (VASCEPA) 1 g capsule Take 2 capsules (2 g total) by mouth 2 (two) times daily.   loratadine (CLARITIN) 10 MG tablet Take 1 tablet (10 mg total) by mouth daily.   LORazepam (ATIVAN) 0.5 MG tablet    metoprolol tartrate (LOPRESSOR) 25 MG tablet Take 1 tablet (25 mg total) by mouth 2 (two) times daily.   omeprazole (PRILOSEC) 40 MG capsule TAKE 1 CAPSULE DAILY   promethazine-dextromethorphan (PROMETHAZINE-DM) 6.25-15 MG/5ML syrup Take 1.3 mLs by mouth 4 (four) times daily as needed for cough.   rosuvastatin (CRESTOR) 10 MG tablet Take 1 tablet (10 mg total) by mouth daily.   Semaglutide, 2 MG/DOSE, 8 MG/3ML SOPN Inject 2 mg as directed once a week.   sildenafil (VIAGRA) 100 MG tablet Take 0.5-1 tablets (50-100 mg total) by mouth daily as needed for erectile dysfunction.   Vitamin D, Ergocalciferol, (DRISDOL) 1.25 MG (50000 UNIT) CAPS capsule TAKE 1 CAPSULE EVERY 7 DAYS   albuterol (VENTOLIN HFA) 108 (90 Base) MCG/ACT inhaler Inhale 2 puffs into the lungs every 6 (six) hours as needed for wheezing or shortness of breath. (Patient not taking: Reported on 10/31/2021)  amoxicillin-clavulanate (AUGMENTIN) 875-125 MG tablet Take 1 tablet by mouth 2 (two) times daily. (Patient not taking: Reported on 10/31/2021)   Coenzyme Q10 (COQ-10) 100 MG CAPS Take 100 mg by mouth. 1 Capsule Daily (Patient not taking: Reported on 10/31/2021)   Facility-Administered Encounter Medications as of 10/31/2021  Medication   cyanocobalamin ((VITAMIN B-12)) injection 1,000 mcg     REVIEW OF SYSTEMS  : All other systems reviewed and negative except where noted in the History of Present Illness.   PHYSICAL EXAM: BP (!) 154/90   Pulse 98   Ht 5\' 10"  (1.778 m)   Wt 239 lb (108.4 kg)   BMI 34.29 kg/m  General: Well developed white  male in no acute distress Head: Normocephalic and atraumatic Eyes:  Sclerae anicteric, conjunctiva pink. Ears: Normal auditory acuity Lungs: Clear throughout to auscultation; no W/R/R. Heart: Regular rate and rhythm; no M/R/G. Abdomen: Soft, non-distended.  BS present.  Non-tender. Musculoskeletal: Symmetrical with no gross deformities  Skin: No lesions on visible extremities Extremities: No edema  Neurological: Alert oriented x 4, grossly non-focal Psychological:  Alert and cooperative. Normal mood and affect  ASSESSMENT AND PLAN: *Belching/GERD: Longstanding acid reflux for which he has been on omeprazole 40 mg daily for 25 to 30 years.  Remembers having an EGD with dilation in the 90s.  Now with new symptoms of belching and a metallic taste in his mouth.  He recently started CPAP machine and the symptoms began after that.  Also recently started [facility] and then switched to Ozempic.  The belching is likely combination of his CPAP with air ingestion, his slowed gastric emptying that comes with the Ozempic.  May be a component of reflux as well, but overall it sounds like his acid reflux is fairly well controlled.  We will plan for EGD with Dr[facility].  The risks, benefits, and alternatives to EGD were discussed with the patient and he consents to proceed.   CC:  Myrtie Neither, DO

## 2021-10-31 NOTE — Progress Notes (Signed)
____________________________________________________________  Attending physician addendum:  Thank you for sending this case to me. I have reviewed the entire note and agree with your assessment and the plan for upper endoscopy.  Despite the probable recent triggers for worsening of the reflux, the longstanding pre-existing symptoms warrant endoscopic evaluation.  Amada Jupiter, MD  ____________________________________________________________

## 2021-10-31 NOTE — Patient Instructions (Signed)
You have been scheduled for an endoscopy. Please follow written instructions given to you at your visit today. If you use inhalers (even only as needed), please bring them with you on the day of your procedure.  If you are age 54 or older, your body mass index should be between 23-30. Your Body mass index is 34.29 kg/m. If this is out of the aforementioned range listed, please consider follow up with your Primary Care Provider.  If you are age 20 or younger, your body mass index should be between 19-25. Your Body mass index is 34.29 kg/m. If this is out of the aformentioned range listed, please consider follow up with your Primary Care Provider.   ________________________________________________________  The [facility] providers would like to encourage you to use [facility] to communicate with providers for non-urgent requests or questions.  Due to long hold times on the telephone, sending your provider a message by [facility] may be a faster and more efficient way to get a response.  Please allow 48 business hours for a response.  Please remember that this is for non-urgent requests.  _______________________________________________________

## 2021-11-08 DIAGNOSIS — G4733 Obstructive sleep apnea (adult) (pediatric): Secondary | ICD-10-CM | POA: Diagnosis not present

## 2021-11-17 ENCOUNTER — Encounter: Payer: Self-pay | Admitting: Certified Registered Nurse Anesthetist

## 2021-11-19 ENCOUNTER — Ambulatory Visit (AMBULATORY_SURGERY_CENTER): Payer: BC Managed Care – PPO | Admitting: Gastroenterology

## 2021-11-19 ENCOUNTER — Encounter: Payer: Self-pay | Admitting: Gastroenterology

## 2021-11-19 VITALS — BP 126/82 | HR 83 | Temp 98.4°F | Resp 14 | Ht 70.0 in | Wt 239.0 lb

## 2021-11-19 DIAGNOSIS — R142 Eructation: Secondary | ICD-10-CM | POA: Diagnosis not present

## 2021-11-19 DIAGNOSIS — R140 Abdominal distension (gaseous): Secondary | ICD-10-CM | POA: Diagnosis not present

## 2021-11-19 DIAGNOSIS — K219 Gastro-esophageal reflux disease without esophagitis: Secondary | ICD-10-CM

## 2021-11-19 MED ORDER — SODIUM CHLORIDE 0.9 % IV SOLN: 500.0000 mL | Freq: Once | INTRAVENOUS | Status: DC

## 2021-11-19 NOTE — Progress Notes (Signed)
No changes to clinical history since GI office visit on 10/31/21.  The patient is appropriate for an endoscopic procedure in the ambulatory setting.  - Amada Jupiter, MD

## 2021-11-19 NOTE — Op Note (Signed)
[name] Procedure Date: 11/19/2021 9:12 AM MRN: [id number] Endoscopist: Mallie Mussel L. Danis , MD Age: 56 Referring MD:  Date of Birth: 07-24-1965 Gender: Male Account #: [id number] Procedure:                Upper GI endoscopy Indications:              Screening for Barrett's esophagus in patient at                            risk for this condition, Abdominal bloating,                            Eructation                           Many years GERD requiring daily PPI                           Several months add'l symptoms coinciding with                            starting GLP-1 agonist for diabetes and CPAP for                            OSA. Medicines:                Monitored Anesthesia Care Procedure:                Pre-Anesthesia Assessment:                           - Prior to the procedure, a History and Physical                            was performed, and patient medications and                            allergies were reviewed. The patient's tolerance of                            previous anesthesia was also reviewed. The risks                            and benefits of the procedure and the sedation                            options and risks were discussed with the patient.                            All questions were answered, and informed consent                            was obtained. Prior Anticoagulants: The patient has  taken no previous anticoagulant or antiplatelet                            agents. ASA Grade Assessment: II - A patient with                            mild systemic disease. After reviewing the risks                            and benefits, the patient was deemed in                            satisfactory condition to undergo the procedure.                           After obtaining informed consent, the endoscope was                            passed under direct vision. Throughout  the                            procedure, the patient's blood pressure, pulse, and                            oxygen saturations were monitored continuously. The                            Endoscope was introduced through the mouth, and                            advanced to the second part of duodenum. The upper                            GI endoscopy was accomplished without difficulty.                            The patient tolerated the procedure well. Scope In: Scope Out: Findings:                 There is no endoscopic evidence of Barrett's                            esophagus, esophagitis or hiatal hernia in the                            entire esophagus. Normal LES tone and no hiatal                            hernia.                           A large amount of food (residue) was found in the  gastric body. This limited the visualization both                            on forward view and retroflexion. Patent pylorus.                           The examined duodenum was normal. Complications:            No immediate complications. Estimated Blood Loss:     Estimated blood loss: none. Impression:               - A large amount of food (residue) in the stomach.                           - Normal examined duodenum.                           - No specimens collected.                           The retained food is from ozempic. Patient does not                            have vomiting to suggest gastroparesis. Recommendation:           - Patient has a contact number available for                            emergencies. The signs and symptoms of potential                            delayed complications were discussed with the                            patient. Return to normal activities tomorrow.                            Written discharge instructions were provided to the                            patient.                           - Resume previous  diet.                           - Continue present medications.                           - Follow an antireflux regimen indefinitely.                           - Avoid eating 3-4 hrs before bedtime.                           Elevate head of bed.  Optimize blood sugar control. Gertie Broerman L. Myrtie Neither, MD 11/19/2021 9:42:25 AM This report has been signed electronically.

## 2021-11-19 NOTE — Progress Notes (Signed)
0914 Robinul 0.1 mg IV given due large amount of secretions upon assessment.  MD made aware, vss  

## 2021-11-19 NOTE — Progress Notes (Signed)
Vital signs checked by:CW ? ?The medical and surgical history was reviewed and verified with the patient. ? ?

## 2021-11-19 NOTE — Patient Instructions (Addendum)
Please read handouts provided. Continue present medications. Follow an antireflux regimen indefinitely. Elevate head of bed.   YOU HAD AN ENDOSCOPIC PROCEDURE TODAY AT THE [facility]:   Refer to the procedure report that was given to you for any specific questions about what was found during the examination.  If the procedure report does not answer your questions, please call your gastroenterologist to clarify.  If you requested that your care partner not be given the details of your procedure findings, then the procedure report has been included in a sealed envelope for you to review at your convenience later.  YOU SHOULD EXPECT: Some feelings of bloating in the abdomen. Passage of more gas than usual.  Walking can help get rid of the air that was put into your GI tract during the procedure and reduce the bloating. If you had a lower endoscopy (such as a colonoscopy or flexible sigmoidoscopy) you may notice spotting of blood in your stool or on the toilet paper. If you underwent a bowel prep for your procedure, you may not have a normal bowel movement for a few days.  Please Note:  You might notice some irritation and congestion in your nose or some drainage.  This is from the oxygen used during your procedure.  There is no need for concern and it should clear up in a day or so.  SYMPTOMS TO REPORT IMMEDIATELY:  Following upper endoscopy (EGD)  Vomiting of blood or coffee ground material  New chest pain or pain under the shoulder blades  Painful or persistently difficult swallowing  New shortness of breath  Fever of 100F or higher  Black, tarry-looking stools  For urgent or emergent issues, a gastroenterologist can be reached at any hour by calling [phone]. Do not use MyChart messaging for urgent concerns.    DIET:  We do recommend a small meal at first, but then you may proceed to your regular diet.  Drink plenty of fluids but you should avoid alcoholic beverages for  24 hours.  ACTIVITY:  You should plan to take it easy for the rest of today and you should NOT DRIVE or use heavy machinery until tomorrow (because of the sedation medicines used during the test).    FOLLOW UP: Our staff will call the number listed on your records the next business day following your procedure.  We will call around 7:15- 8:00 am to check on you and address any questions or concerns that you may have regarding the information given to you following your procedure. If we do not reach you, we will leave a message.  If you develop any symptoms (ie: fever, flu-like symptoms, shortness of breath, cough etc.) before then, please call [phone].  If you test positive for Covid 19 in the 2 weeks post procedure, please call and report this information to [place].    If any biopsies were taken you will be contacted by phone or by letter within the next 1-3 weeks.  Please call us at [phone] if you have not heard about the biopsies in 3 weeks.    SIGNATURES/CONFIDENTIALITY: You and/or your care partner have signed paperwork which will be entered into your electronic medical record.  These signatures attest to the fact that that the information above on your After Visit Summary has been reviewed and is understood.  Full responsibility of the confidentiality of this discharge information lies with you and/or your care-partner.   __________________________________   _______________________________________________________  Food Guidelines for those  with chronic digestive trouble:  Many people have difficulty digesting certain foods, causing a variety of distressing and embarrassing symptoms such as abdominal pain, bloating and gas.  These foods may need to be avoided or consumed in small amounts.  Here are some tips that might be helpful for you.  1.   Lactose intolerance is the difficulty or complete inability to digest lactose, the natural sugar in milk and anything made from milk.   This condition is harmless, common, and can begin any time during life.  Some people can digest a modest amount of lactose while others cannot tolerate any.  Also, not all dairy products contain equal amounts of lactose.  For example, hard cheeses such as parmesan have less lactose than soft cheeses such as cheddar.  Yogurt has less lactose than milk or cheese.  Many packaged foods (even many brands of bread) have milk, so read ingredient lists carefully.  It is difficult to test for lactose intolerance, so just try avoiding lactose as much as possible for a week and see what happens with your symptoms.  If you seem to be lactose intolerant, the best plan is to avoid it (but make sure you get calcium from another source).  The next best thing is to use lactase enzyme supplements, available over the counter everywhere.  Just know that many lactose intolerant people need to take several tablets with each serving of dairy to avoid symptoms.  Lastly, a lot of restaurant food is made with milk or butter.  Many are things you might not suspect, such as mashed potatoes, rice and pasta (cooked with butter) and "grilled" items.  If you are lactose intolerant, it never hurts to ask your server what has milk or butter.  2.   Fiber is an important part of your diet, but not all fiber is well-tolerated.  Insoluble fiber such as bran is often consumed by normal gut bacteria and converted into gas.  Soluble fiber such as oats, squash, carrots and green beans are typically tolerated better.  3.   Some types of carbohydrates can be poorly digested.  Examples include: fructose (apples, cherries, pears, raisins and other dried fruits), fructans (onions, zucchini, large amounts of wheat), sorbitol/mannitol/xylitol and sucralose/Splenda (common artificial sweeteners), and raffinose (lentils, broccoli, cabbage, asparagus, brussel sprouts, many types of beans).  Do a Programmer, multimedia for [employer] and you will find helpful  information. Beano, a dietary supplement, will often help with raffinose-containing foods.  As with lactase tablets, you may need several per serving.  4.   Whenever possible, avoid processed food&meats and chemical additives.  High fructose corn syrup, a common sweetener, may be difficult to digest.  Eggs and soy (comes from the soybean, and added to many foods now) are other common bloating/gassy foods.  5.  Regarding gluten:  gluten is a protein mainly found in wheat, but also rye and barley.  There is a condition called celiac sprue, which is an inflammatory reaction in the small intestine causing a variety of digestive symptoms.  Blood testing is highly reliable to look for this condition, and sometimes upper endoscopy with small bowel biopsies may be necessary to make the diagnosis.  Many patients who test negative for celiac sprue report improvement in their digestive symptoms when they switch to a gluten-free diet.  However, in these "non-celiac gluten sensitive" patients, the true role of gluten in their symptoms is unclear.  Reducing carbohydrates in general may decrease the gas and bloating caused when gut  bacteria consume carbs. Also, some of these patients may actually be intolerant of the baker's yeast in bread products rather than the gluten.  Flatbread and other reduced yeast breads might therefore be tolerated.  There is no specific testing available for most food intolerances, which are discovered mainly by dietary elimination.  Please do not embark on a gluten free diet unless directed by your doctor, as it is highly restrictive, and may lead to nutritional deficiencies if not carefully monitored.  Lastly, beware of internet claims offering "personalized" tests for food intolerances.  Such testing has no reliable scientific evidence to support its reliability and correlation to symptoms.    6.  The best advice is old advice, especially for those with chronic digestive trouble - try to eat  "clean".  Balanced diet, avoid processed food, plenty of fruits and vegetables, cut down the sugar, minimal alcohol, avoid tobacco. Make time to care for yourself, get enough sleep, exercise when you can, reduce stress.  Your guts will thank you for it.   - Dr. Sherlynn [facility]  ____________________________________________________________

## 2021-11-19 NOTE — Progress Notes (Signed)
Report given to PACU, vss 

## 2021-11-20 ENCOUNTER — Telehealth: Payer: Self-pay

## 2021-11-20 DIAGNOSIS — M9901 Segmental and somatic dysfunction of cervical region: Secondary | ICD-10-CM | POA: Diagnosis not present

## 2021-11-20 DIAGNOSIS — M5033 Other cervical disc degeneration, cervicothoracic region: Secondary | ICD-10-CM | POA: Diagnosis not present

## 2021-11-20 DIAGNOSIS — M5136 Other intervertebral disc degeneration, lumbar region: Secondary | ICD-10-CM | POA: Diagnosis not present

## 2021-11-20 DIAGNOSIS — M9903 Segmental and somatic dysfunction of lumbar region: Secondary | ICD-10-CM | POA: Diagnosis not present

## 2021-11-20 NOTE — Telephone Encounter (Signed)
  Follow up Call-     11/19/2021    8:51 AM  Call back number  Post procedure Call Back phone  # [phone]  Permission to leave phone message Yes     Patient questions:  Do you have a fever, pain , or abdominal swelling? No. Pain Score  0 *  Have you tolerated food without any problems? Yes.    Have you been able to return to your normal activities? Yes.    Do you have any questions about your discharge instructions: Diet   No. Medications  No. Follow up visit  No.  Do you have questions or concerns about your Care? No.  Actions: * If pain score is 4 or above: No action needed, pain <4.

## 2021-11-21 DIAGNOSIS — M5416 Radiculopathy, lumbar region: Secondary | ICD-10-CM | POA: Diagnosis not present

## 2021-11-25 DIAGNOSIS — M5136 Other intervertebral disc degeneration, lumbar region: Secondary | ICD-10-CM | POA: Diagnosis not present

## 2021-11-25 DIAGNOSIS — M5033 Other cervical disc degeneration, cervicothoracic region: Secondary | ICD-10-CM | POA: Diagnosis not present

## 2021-11-25 DIAGNOSIS — M9901 Segmental and somatic dysfunction of cervical region: Secondary | ICD-10-CM | POA: Diagnosis not present

## 2021-11-25 DIAGNOSIS — M9903 Segmental and somatic dysfunction of lumbar region: Secondary | ICD-10-CM | POA: Diagnosis not present

## 2021-11-26 DIAGNOSIS — M5136 Other intervertebral disc degeneration, lumbar region: Secondary | ICD-10-CM | POA: Diagnosis not present

## 2021-11-26 DIAGNOSIS — M5033 Other cervical disc degeneration, cervicothoracic region: Secondary | ICD-10-CM | POA: Diagnosis not present

## 2021-11-26 DIAGNOSIS — M9901 Segmental and somatic dysfunction of cervical region: Secondary | ICD-10-CM | POA: Diagnosis not present

## 2021-11-26 DIAGNOSIS — M9903 Segmental and somatic dysfunction of lumbar region: Secondary | ICD-10-CM | POA: Diagnosis not present

## 2021-11-27 DIAGNOSIS — M9901 Segmental and somatic dysfunction of cervical region: Secondary | ICD-10-CM | POA: Diagnosis not present

## 2021-11-27 DIAGNOSIS — G4733 Obstructive sleep apnea (adult) (pediatric): Secondary | ICD-10-CM | POA: Diagnosis not present

## 2021-11-27 DIAGNOSIS — M5136 Other intervertebral disc degeneration, lumbar region: Secondary | ICD-10-CM | POA: Diagnosis not present

## 2021-11-27 DIAGNOSIS — M5033 Other cervical disc degeneration, cervicothoracic region: Secondary | ICD-10-CM | POA: Diagnosis not present

## 2021-11-27 DIAGNOSIS — M9903 Segmental and somatic dysfunction of lumbar region: Secondary | ICD-10-CM | POA: Diagnosis not present

## 2021-12-02 DIAGNOSIS — M9901 Segmental and somatic dysfunction of cervical region: Secondary | ICD-10-CM | POA: Diagnosis not present

## 2021-12-02 DIAGNOSIS — M5136 Other intervertebral disc degeneration, lumbar region: Secondary | ICD-10-CM | POA: Diagnosis not present

## 2021-12-02 DIAGNOSIS — M9903 Segmental and somatic dysfunction of lumbar region: Secondary | ICD-10-CM | POA: Diagnosis not present

## 2021-12-02 DIAGNOSIS — M5033 Other cervical disc degeneration, cervicothoracic region: Secondary | ICD-10-CM | POA: Diagnosis not present

## 2021-12-03 ENCOUNTER — Telehealth: Payer: Self-pay

## 2021-12-03 DIAGNOSIS — M5136 Other intervertebral disc degeneration, lumbar region: Secondary | ICD-10-CM | POA: Diagnosis not present

## 2021-12-03 DIAGNOSIS — M5033 Other cervical disc degeneration, cervicothoracic region: Secondary | ICD-10-CM | POA: Diagnosis not present

## 2021-12-03 DIAGNOSIS — M9901 Segmental and somatic dysfunction of cervical region: Secondary | ICD-10-CM | POA: Diagnosis not present

## 2021-12-03 DIAGNOSIS — M9903 Segmental and somatic dysfunction of lumbar region: Secondary | ICD-10-CM | POA: Diagnosis not present

## 2021-12-03 NOTE — Chronic Care Management (AMB) (Signed)
  Care Management   Outreach Note  12/03/2021 Name: Scott Rasmussen MRN: [id number] DOB: 1965/08/05  An unsuccessful telephone outreach was attempted today. The patient was referred to the case management team for assistance with care management and care coordination.   Follow Up Plan:  A HIPAA compliant phone message was left for the patient providing contact information and requesting a return call.  The care management team will reach out to the patient again over the next 7 days.  If patient returns call to provider office, please advise to call Care Management Care Guide Penne Lash * at [phone]*  Penne Lash, RMA Care Guide [facility]  [place], [place] [address] Direct Dial: [phone] [email or web address]

## 2021-12-09 DIAGNOSIS — M5136 Other intervertebral disc degeneration, lumbar region: Secondary | ICD-10-CM | POA: Diagnosis not present

## 2021-12-09 DIAGNOSIS — M9903 Segmental and somatic dysfunction of lumbar region: Secondary | ICD-10-CM | POA: Diagnosis not present

## 2021-12-09 DIAGNOSIS — M5033 Other cervical disc degeneration, cervicothoracic region: Secondary | ICD-10-CM | POA: Diagnosis not present

## 2021-12-09 DIAGNOSIS — M9901 Segmental and somatic dysfunction of cervical region: Secondary | ICD-10-CM | POA: Diagnosis not present

## 2021-12-10 ENCOUNTER — Other Ambulatory Visit: Payer: Self-pay | Admitting: Family Medicine

## 2021-12-10 ENCOUNTER — Ambulatory Visit: Payer: BC Managed Care – PPO | Admitting: Family

## 2021-12-10 ENCOUNTER — Encounter: Payer: Self-pay | Admitting: Family

## 2021-12-10 VITALS — BP 127/85 | HR 79 | Temp 97.8°F | Ht 70.0 in | Wt 241.0 lb

## 2021-12-10 DIAGNOSIS — S70362A Insect bite (nonvenomous), left thigh, initial encounter: Secondary | ICD-10-CM

## 2021-12-10 DIAGNOSIS — F17200 Nicotine dependence, unspecified, uncomplicated: Secondary | ICD-10-CM | POA: Diagnosis not present

## 2021-12-10 DIAGNOSIS — B079 Viral wart, unspecified: Secondary | ICD-10-CM | POA: Diagnosis not present

## 2021-12-10 DIAGNOSIS — R058 Other specified cough: Secondary | ICD-10-CM

## 2021-12-10 DIAGNOSIS — M5136 Other intervertebral disc degeneration, lumbar region: Secondary | ICD-10-CM | POA: Diagnosis not present

## 2021-12-10 DIAGNOSIS — M9901 Segmental and somatic dysfunction of cervical region: Secondary | ICD-10-CM | POA: Diagnosis not present

## 2021-12-10 DIAGNOSIS — M5033 Other cervical disc degeneration, cervicothoracic region: Secondary | ICD-10-CM | POA: Diagnosis not present

## 2021-12-10 DIAGNOSIS — S30861A Insect bite (nonvenomous) of abdominal wall, initial encounter: Secondary | ICD-10-CM

## 2021-12-10 DIAGNOSIS — W57XXXA Bitten or stung by nonvenomous insect and other nonvenomous arthropods, initial encounter: Secondary | ICD-10-CM | POA: Diagnosis not present

## 2021-12-10 DIAGNOSIS — M9903 Segmental and somatic dysfunction of lumbar region: Secondary | ICD-10-CM | POA: Diagnosis not present

## 2021-12-10 MED ORDER — PROMETHAZINE-DM 6.25-15 MG/5ML PO SYRP: 1.2500 mL | ORAL_SOLUTION | Freq: Four times a day (QID) | ORAL | 0 refills | Status: DC | PRN

## 2021-12-10 MED ORDER — PROMETHAZINE-DM 6.25-15 MG/5ML PO SYRP: 5.0000 mL | ORAL_SOLUTION | Freq: Two times a day (BID) | ORAL | 0 refills | Status: DC

## 2021-12-10 MED ORDER — BREZTRI AEROSPHERE 160-9-4.8 MCG/ACT IN AERO: 2.0000 | INHALATION_SPRAY | Freq: Two times a day (BID) | RESPIRATORY_TRACT | 11 refills | Status: DC

## 2021-12-10 NOTE — Patient Instructions (Signed)

## 2021-12-10 NOTE — Progress Notes (Signed)
Subjective:    Patient ID: Scott Rasmussen, male    DOB: 1965/08/03, 56 y.o.   MRN: [id number]  Chief Complaint  Patient presents with   Tick Removal    Wants alph gal test    Pt presents to the office today for tick bite. States he removed a tick upper abdomen and left upper thigh about two months ago. Since then when he eats red meat he has noticed bloating and nausea. He is worried he has alpha gal.   Denies fever, rash, new joint pain, or headache.   Complaining of wart right knee that has been there for months.  Cough This is a new problem. The current episode started more than 1 month ago. The problem has been waxing and waning. The problem occurs every few minutes. The cough is Non-productive. Pertinent negatives include no chills, ear congestion, ear pain, nasal congestion, rhinorrhea, shortness of breath or wheezing. Risk factors for lung disease include smoking/tobacco exposure. He has tried rest for the symptoms. The treatment provided mild relief.  Nicotine Dependence Presents for follow-up visit. His urge triggers include company of smokers. The symptoms have been stable. He smokes < 1/2 a pack of cigarettes per day.      Review of Systems  Constitutional:  Negative for chills.  HENT:  Negative for ear pain and rhinorrhea.   Respiratory:  Positive for cough. Negative for shortness of breath and wheezing.   All other systems reviewed and are negative.      Objective:   Physical Exam Vitals reviewed.  Constitutional:      General: He is not in acute distress.    Appearance: He is well-developed. He is obese.  HENT:     Head: Normocephalic.     Right Ear: Tympanic membrane normal.     Left Ear: Tympanic membrane normal.  Eyes:     General:        Right eye: No discharge.        Left eye: No discharge.     Pupils: Pupils are equal, round, and reactive to light.  Neck:     Thyroid: No thyromegaly.  Cardiovascular:     Rate and Rhythm: Normal rate and regular  rhythm.     Heart sounds: Normal heart sounds. No murmur heard. Pulmonary:     Effort: Pulmonary effort is normal. No respiratory distress.     Breath sounds: Normal breath sounds. No wheezing.  Abdominal:     General: Bowel sounds are normal. There is no distension.     Palpations: Abdomen is soft.     Tenderness: There is no abdominal tenderness.  Musculoskeletal:        General: No tenderness. Normal range of motion.     Cervical back: Normal range of motion and neck supple.  Skin:    General: Skin is warm and dry.     Findings: Rash present. No erythema.          Comments: Wart on right knee  Neurological:     Mental Status: He is alert and oriented to person, place, and time.     Cranial Nerves: No cranial nerve deficit.     Deep Tendon Reflexes: Reflexes are normal and symmetric.  Psychiatric:        Behavior: Behavior normal.        Thought Content: Thought content normal.        Judgment: Judgment normal.    Cryotherapy used on right knee. Tolerated well.  BP 127/85   Pulse 79   Temp 97.8 F (36.6 C) (Temporal)   Ht 5\' 10"  (1.778 m)   Wt 241 lb (109.3 kg)   SpO2 95%   BMI 34.58 kg/m        Assessment & Plan:  Scott Rasmussen comes in today with chief complaint of Tick Removal (Wants alph gal test )   Diagnosis and orders addressed:  1. Tick bite of abdominal wall, initial encounter Labs pending  - Alpha-Gal Panel  2. Tick bite of left thigh, initial encounter - Alpha-Gal Panel  3. Viral warts, unspecified type Cryotherapy used   4. Current smoker Smoking cessation discussed   5. Cough present for greater than 3 weeks Continue PPI Start Claritin daily, if no improvement in next week. Start sample of Breztri Follow up with PCP Smoking cessation discussed  - promethazine-dextromethorphan (PROMETHAZINE-DM) 6.25-15 MG/5ML syrup; Take 1.3 mLs by mouth 4 (four) times daily as needed for cough.  Dispense: 240 mL; Refill: 0 -  Budeson-Glycopyrrol-Formoterol (BREZTRI AEROSPHERE) 160-9-4.8 MCG/ACT AERO; Inhale 2 puffs into the lungs 2 (two) times daily.  Dispense: 10.7 g; Refill: 11    Health Maintenance reviewed Diet and exercise encouraged  Follow up plan: Keep follow up with PCP   06-23-1990, FNP

## 2021-12-11 DIAGNOSIS — M5033 Other cervical disc degeneration, cervicothoracic region: Secondary | ICD-10-CM | POA: Diagnosis not present

## 2021-12-11 DIAGNOSIS — M5136 Other intervertebral disc degeneration, lumbar region: Secondary | ICD-10-CM | POA: Diagnosis not present

## 2021-12-11 DIAGNOSIS — M9901 Segmental and somatic dysfunction of cervical region: Secondary | ICD-10-CM | POA: Diagnosis not present

## 2021-12-11 DIAGNOSIS — M9903 Segmental and somatic dysfunction of lumbar region: Secondary | ICD-10-CM | POA: Diagnosis not present

## 2021-12-11 NOTE — Chronic Care Management (AMB) (Signed)
  Care Management   Outreach Note  12/11/2021 Name: Scott Rasmussen MRN: [id number] DOB: 1965/12/10  A second unsuccessful telephone outreach was attempted today. The patient was referred to the case management team for assistance with care management and care coordination.   Follow Up Plan:  The care management team will reach out to the patient again over the next 7 days.  If patient returns call to provider office, please advise to call Care Management Care Guide Penne Lash * at [phone]*  Penne Lash, RMA Care Guide [facility]  [place], [place] [address] Direct Dial: [phone] [email or web address]

## 2021-12-12 DIAGNOSIS — M5033 Other cervical disc degeneration, cervicothoracic region: Secondary | ICD-10-CM | POA: Diagnosis not present

## 2021-12-12 DIAGNOSIS — M9903 Segmental and somatic dysfunction of lumbar region: Secondary | ICD-10-CM | POA: Diagnosis not present

## 2021-12-12 DIAGNOSIS — M5136 Other intervertebral disc degeneration, lumbar region: Secondary | ICD-10-CM | POA: Diagnosis not present

## 2021-12-12 DIAGNOSIS — M9901 Segmental and somatic dysfunction of cervical region: Secondary | ICD-10-CM | POA: Diagnosis not present

## 2021-12-12 LAB — ALPHA-GAL PANEL
Allergen Lamb IgE: <0.1 kU/L
Beef IgE: <0.1 kU/L
IgE (Immunoglobulin E), Serum: 35 IU/mL (ref 6–495)
O215-IgE Alpha-Gal: <0.1 kU/L
Pork IgE: <0.1 kU/L

## 2021-12-16 ENCOUNTER — Encounter: Payer: Self-pay | Admitting: Family Medicine

## 2021-12-18 DIAGNOSIS — M9901 Segmental and somatic dysfunction of cervical region: Secondary | ICD-10-CM | POA: Diagnosis not present

## 2021-12-18 DIAGNOSIS — M5033 Other cervical disc degeneration, cervicothoracic region: Secondary | ICD-10-CM | POA: Diagnosis not present

## 2021-12-18 DIAGNOSIS — M5136 Other intervertebral disc degeneration, lumbar region: Secondary | ICD-10-CM | POA: Diagnosis not present

## 2021-12-18 DIAGNOSIS — M9903 Segmental and somatic dysfunction of lumbar region: Secondary | ICD-10-CM | POA: Diagnosis not present

## 2021-12-19 ENCOUNTER — Telehealth: Payer: Self-pay

## 2021-12-19 ENCOUNTER — Encounter: Payer: Self-pay | Admitting: Family Medicine

## 2021-12-19 DIAGNOSIS — M5136 Other intervertebral disc degeneration, lumbar region: Secondary | ICD-10-CM | POA: Diagnosis not present

## 2021-12-19 DIAGNOSIS — M9901 Segmental and somatic dysfunction of cervical region: Secondary | ICD-10-CM | POA: Diagnosis not present

## 2021-12-19 DIAGNOSIS — M9903 Segmental and somatic dysfunction of lumbar region: Secondary | ICD-10-CM | POA: Diagnosis not present

## 2021-12-19 DIAGNOSIS — M5033 Other cervical disc degeneration, cervicothoracic region: Secondary | ICD-10-CM | POA: Diagnosis not present

## 2021-12-19 NOTE — Telephone Encounter (Signed)
Scott Rasmussen (Key: [id number]) Ozempic (2 MG/DOSE) 8MG /3ML pen-injectors   [employer] PA Form (2017 NCPDP) Created 3 days ago Sent to Plan 1 minute ago Plan Response 1 minute ago Submit Clinical Questions less than a minute ago Determination Wait for Determination Please wait for [employer] 2017 to return a determination.

## 2021-12-24 NOTE — Chronic Care Management (AMB) (Signed)
  Care Coordination  Outreach Note  12/24/2021 Name: MACAULEY MOSSBERG MRN: [id number] DOB: Mar 25, 1966   Care Coordination Outreach Attempts  A third unsuccessful outreach was attempted today to offer the patient with information about available care coordination services as a benefit of their health plan.   Follow Up Plan:  No further outreach attempts will be made at this time. We have been unable to contact the patient to offer or enroll patient in care coordination services  Encounter Outcome:  No Answer  Sig Penne Lash, RMA Care Guide [facility]  [place], [place] [address] Direct Dial: [phone] [email or web address]

## 2021-12-27 NOTE — Telephone Encounter (Signed)
Pt already aware.

## 2021-12-27 NOTE — Telephone Encounter (Signed)
Al Corpus (Key: [id number]) - [id number] Ozempic (2 MG/DOSE) 8MG pen-injectors  Status: PA Response - Denied  Patient does not have diagnosis of Diabetes Mellitus

## 2021-12-30 DIAGNOSIS — M5033 Other cervical disc degeneration, cervicothoracic region: Secondary | ICD-10-CM | POA: Diagnosis not present

## 2021-12-30 DIAGNOSIS — M9903 Segmental and somatic dysfunction of lumbar region: Secondary | ICD-10-CM | POA: Diagnosis not present

## 2021-12-30 DIAGNOSIS — M5136 Other intervertebral disc degeneration, lumbar region: Secondary | ICD-10-CM | POA: Diagnosis not present

## 2021-12-30 DIAGNOSIS — M9901 Segmental and somatic dysfunction of cervical region: Secondary | ICD-10-CM | POA: Diagnosis not present

## 2021-12-31 DIAGNOSIS — M5136 Other intervertebral disc degeneration, lumbar region: Secondary | ICD-10-CM | POA: Diagnosis not present

## 2021-12-31 DIAGNOSIS — M9901 Segmental and somatic dysfunction of cervical region: Secondary | ICD-10-CM | POA: Diagnosis not present

## 2021-12-31 DIAGNOSIS — M9903 Segmental and somatic dysfunction of lumbar region: Secondary | ICD-10-CM | POA: Diagnosis not present

## 2021-12-31 DIAGNOSIS — M5033 Other cervical disc degeneration, cervicothoracic region: Secondary | ICD-10-CM | POA: Diagnosis not present

## 2022-01-01 ENCOUNTER — Encounter: Payer: Self-pay | Admitting: Cardiovascular Disease

## 2022-01-01 DIAGNOSIS — M5136 Other intervertebral disc degeneration, lumbar region: Secondary | ICD-10-CM | POA: Diagnosis not present

## 2022-01-01 DIAGNOSIS — M9903 Segmental and somatic dysfunction of lumbar region: Secondary | ICD-10-CM | POA: Diagnosis not present

## 2022-01-01 DIAGNOSIS — M5033 Other cervical disc degeneration, cervicothoracic region: Secondary | ICD-10-CM | POA: Diagnosis not present

## 2022-01-01 DIAGNOSIS — M9901 Segmental and somatic dysfunction of cervical region: Secondary | ICD-10-CM | POA: Diagnosis not present

## 2022-01-02 DIAGNOSIS — M5033 Other cervical disc degeneration, cervicothoracic region: Secondary | ICD-10-CM | POA: Diagnosis not present

## 2022-01-02 DIAGNOSIS — M9903 Segmental and somatic dysfunction of lumbar region: Secondary | ICD-10-CM | POA: Diagnosis not present

## 2022-01-02 DIAGNOSIS — M9901 Segmental and somatic dysfunction of cervical region: Secondary | ICD-10-CM | POA: Diagnosis not present

## 2022-01-02 DIAGNOSIS — M5136 Other intervertebral disc degeneration, lumbar region: Secondary | ICD-10-CM | POA: Diagnosis not present

## 2022-01-02 DIAGNOSIS — M5416 Radiculopathy, lumbar region: Secondary | ICD-10-CM | POA: Diagnosis not present

## 2022-01-03 ENCOUNTER — Encounter: Payer: Self-pay | Admitting: Family Medicine

## 2022-01-06 DIAGNOSIS — M5136 Other intervertebral disc degeneration, lumbar region: Secondary | ICD-10-CM | POA: Diagnosis not present

## 2022-01-06 DIAGNOSIS — M5033 Other cervical disc degeneration, cervicothoracic region: Secondary | ICD-10-CM | POA: Diagnosis not present

## 2022-01-06 DIAGNOSIS — M9901 Segmental and somatic dysfunction of cervical region: Secondary | ICD-10-CM | POA: Diagnosis not present

## 2022-01-06 DIAGNOSIS — M9903 Segmental and somatic dysfunction of lumbar region: Secondary | ICD-10-CM | POA: Diagnosis not present

## 2022-01-07 DIAGNOSIS — M9903 Segmental and somatic dysfunction of lumbar region: Secondary | ICD-10-CM | POA: Diagnosis not present

## 2022-01-07 DIAGNOSIS — M9901 Segmental and somatic dysfunction of cervical region: Secondary | ICD-10-CM | POA: Diagnosis not present

## 2022-01-07 DIAGNOSIS — M5033 Other cervical disc degeneration, cervicothoracic region: Secondary | ICD-10-CM | POA: Diagnosis not present

## 2022-01-07 DIAGNOSIS — M5136 Other intervertebral disc degeneration, lumbar region: Secondary | ICD-10-CM | POA: Diagnosis not present

## 2022-01-08 ENCOUNTER — Telehealth (INDEPENDENT_AMBULATORY_CARE_PROVIDER_SITE_OTHER): Payer: BC Managed Care – PPO | Admitting: Family Medicine

## 2022-01-08 ENCOUNTER — Encounter: Payer: Self-pay | Admitting: Family Medicine

## 2022-01-08 DIAGNOSIS — J0100 Acute maxillary sinusitis, unspecified: Secondary | ICD-10-CM | POA: Diagnosis not present

## 2022-01-08 MED ORDER — PREDNISONE 20 MG PO TABS: 40.0000 mg | ORAL_TABLET | Freq: Every day | ORAL | 0 refills | Status: AC

## 2022-01-08 MED ORDER — GUAIFENESIN ER 600 MG PO TB12: 600.0000 mg | ORAL_TABLET | Freq: Two times a day (BID) | ORAL | 0 refills | Status: AC

## 2022-01-08 NOTE — Progress Notes (Signed)
Virtual Visit via MyChart Video Note Due to COVID-19 pandemic this visit was conducted virtually. This visit type was conducted due to national recommendations for restrictions regarding the COVID-19 Pandemic (e.g. social distancing, sheltering in place) in an effort to limit this patient's exposure and mitigate transmission in our community. All issues noted in this document were discussed and addressed.  A physical exam was not performed with this format.   I connected with Scott Rasmussen on 01/08/2022 at 1620 by MyChart Video and verified that I am speaking with the correct person using two identifiers. Scott Rasmussen is currently located at home and patient is currently with them during visit. The provider, Monia Pouch, FNP is located in their office at time of visit.  I discussed the limitations, risks, security and privacy concerns of performing an evaluation and management service by virtual visit and the availability of in person appointments. I also discussed with the patient that there may be a patient responsible charge related to this service. The patient expressed understanding and agreed to proceed.  Subjective:  Patient ID: Scott Rasmussen, male    DOB: April 07, 1966, 56 y.o.   MRN: [id number]  Chief Complaint:  Sinus Problem   HPI: Scott Rasmussen is a 56 y.o. male presenting on 01/08/2022 for Sinus Problem   Pt reports URI symptoms for 4 days. Negative COVID test at home.   Sinus Problem This is a new problem. Episode onset: 4 days ago. The problem has been waxing and waning since onset. There has been no fever. His pain is at a severity of 4/10. The pain is moderate. Associated symptoms include congestion, coughing and sinus pressure. Pertinent negatives include no chills, diaphoresis, ear pain, headaches, hoarse voice, neck pain, shortness of breath, sneezing, sore throat or swollen glands. Past treatments include oral decongestants. The treatment provided mild relief.      Relevant past medical, surgical, family, and social history reviewed and updated as indicated.  Allergies and medications reviewed and updated.   Past Medical History:  Diagnosis Date   Gout    Hallux rigidus 10/11/2018   right    HTN (hypertension)    Hyperlipidemia    Shortness of breath    Sleep apnea    had sx    Umbilical hernia     Past Surgical History:  Procedure Laterality Date   TONSILLECTOMY AND ADENOIDECTOMY  2008   UVULOPALATOPHARYNGOPLASTY  2008    Social History   Socioeconomic History   Marital status: Married    Spouse name: Not on file   Number of children: Not on file   Years of education: Not on file   Highest education level: Not on file  Occupational History   Not on file  Tobacco Use   Smoking status: Some Days    Packs/day: 1.00    Years: 15.00    Total pack years: 15.00    Types: Cigarettes    Last attempt to quit: 2012    Years since quitting: 11.7   Smokeless tobacco: Never  Vaping Use   Vaping Use: Never used  Substance and Sexual Activity   Alcohol use: Yes    Comment: 2 drinks per month per pt.   Drug use: Not Currently    Types: Marijuana   Sexual activity: Not on file  Other Topics Concern   Not on file  Social History Narrative   Works at [employer] Strain:  Not on file  Food Insecurity: Not on file  Transportation Needs: Not on file  Physical Activity: Not on file  Stress: Not on file  Social Connections: Not on file  Intimate Partner Violence: Not on file    Outpatient Encounter Medications as of 01/08/2022  Medication Sig   guaiFENesin (MUCINEX) 600 MG 12 hr tablet Take 1 tablet (600 mg total) by mouth 2 (two) times daily for 10 days.   predniSONE (DELTASONE) 20 MG tablet Take 2 tablets (40 mg total) by mouth daily with breakfast for 5 days.   Budeson-Glycopyrrol-Formoterol (BREZTRI AEROSPHERE) 160-9-4.8 MCG/ACT AERO Inhale 2 puffs into the lungs  2 (two) times daily.   buPROPion ER (WELLBUTRIN SR) 100 MG 12 hr tablet Take 1 tablet (100 mg total) by mouth 2 (two) times daily.   famotidine (PEPCID) 20 MG tablet Take 1 tablet (20 mg total) by mouth 2 (two) times daily.   febuxostat (ULORIC) 40 MG tablet TAKE 1 TABLET DAILY   fluticasone (FLONASE) 50 MCG/ACT nasal spray USE 2 SPRAYS IN EACH NOSTRIL DAILY   icosapent Ethyl (VASCEPA) 1 g capsule Take 2 capsules (2 g total) by mouth 2 (two) times daily.   loratadine (CLARITIN) 10 MG tablet Take 1 tablet (10 mg total) by mouth daily.   LORazepam (ATIVAN) 0.5 MG tablet    metoprolol tartrate (LOPRESSOR) 25 MG tablet Take 1 tablet (25 mg total) by mouth 2 (two) times daily.   mirtazapine (REMERON) 15 MG tablet Take 15 mg by mouth at bedtime.   omeprazole (PRILOSEC) 40 MG capsule TAKE 1 CAPSULE DAILY   promethazine-dextromethorphan (PROMETHAZINE-DM) 6.25-15 MG/5ML syrup Take 5 mLs by mouth in the morning and at bedtime.   rosuvastatin (CRESTOR) 10 MG tablet Take 1 tablet (10 mg total) by mouth daily.   Semaglutide, 2 MG/DOSE, 8 MG/3ML SOPN Inject 2 mg as directed once a week.   sildenafil (VIAGRA) 100 MG tablet Take 0.5-1 tablets (50-100 mg total) by mouth daily as needed for erectile dysfunction.   Vitamin D, Ergocalciferol, (DRISDOL) 1.25 MG (50000 UNIT) CAPS capsule TAKE 1 CAPSULE EVERY 7 DAYS   Facility-Administered Encounter Medications as of 01/08/2022  Medication   cyanocobalamin ((VITAMIN B-12)) injection 1,000 mcg    Allergies  Allergen Reactions   Depo-Medrol [Methylprednisolone Sodium Succ] Rash    Review of Systems  Constitutional:  Positive for activity change, appetite change and fatigue. Negative for chills, diaphoresis, fever and unexpected weight change.  HENT:  Positive for congestion, postnasal drip, rhinorrhea, sinus pressure and sinus pain. Negative for dental problem, drooling, ear discharge, ear pain, facial swelling, hearing loss, hoarse voice, mouth sores,  nosebleeds, sneezing, sore throat, tinnitus, trouble swallowing and voice change.   Eyes:  Negative for photophobia and visual disturbance.  Respiratory:  Positive for cough. Negative for apnea, choking, chest tightness, shortness of breath, wheezing and stridor.   Cardiovascular:  Negative for chest pain, palpitations and leg swelling.  Gastrointestinal:  Negative for abdominal pain.  Genitourinary:  Negative for decreased urine volume and difficulty urinating.  Musculoskeletal:  Negative for arthralgias, myalgias and neck pain.  Neurological:  Negative for dizziness, light-headedness and headaches.  Psychiatric/Behavioral:  Negative for confusion.   All other systems reviewed and are negative.        Observations/Objective: No vital signs or physical exam, this was a virtual health encounter.  Pt alert and oriented, answers all questions appropriately, and able to speak in full sentences.  Pt reports pressure when pressing on maxillary sinuses. Allergic shiners  present.  Assessment and Plan: Domenik was seen today for sinus problem.  Diagnoses and all orders for this visit:  Acute non-recurrent frontal sinusitis No indication of acute bacterial infection. Will treat symptomatically with below. Pt aware to start Flonase as well. If symptoms persist or worse over next 5 days, will start antibiotic therapy.  -     guaiFENesin (MUCINEX) 600 MG 12 hr tablet; Take 1 tablet (600 mg total) by mouth 2 (two) times daily for 10 days. -     predniSONE (DELTASONE) 20 MG tablet; Take 2 tablets (40 mg total) by mouth daily with breakfast for 5 days.     Follow Up Instructions: Return if symptoms worsen or fail to improve.    I discussed the assessment and treatment plan with the patient. The patient was provided an opportunity to ask questions and all were answered. The patient agreed with the plan and demonstrated an understanding of the instructions.   The patient was advised to call back  or seek an in-person evaluation if the symptoms worsen or if the condition fails to improve as anticipated.  The above assessment and management plan was discussed with the patient. The patient verbalized understanding of and has agreed to the management plan. Patient is aware to call the clinic if they develop any new symptoms or if symptoms persist or worsen. Patient is aware when to return to the clinic for a follow-up visit. Patient educated on when it is appropriate to go to the emergency department.    I provided 15 minutes of time during this MyChart Video encounter.   Monia Pouch, FNP-C [facility] [address] [place], [place] [address] [phone] 01/08/2022

## 2022-01-09 ENCOUNTER — Telehealth: Payer: Self-pay | Admitting: *Deleted

## 2022-01-09 NOTE — Telephone Encounter (Signed)
Called patient to see if I can assist him with his CPAP issues. Patient states that when he awakes in the morning his water chamber is empty, and his mouth is very dry. He can only sleep about 4 hours a night before he starts to "toss and turn." The climate was set on "Auto" which should be only giving him what is required. I changed the climate from auto to manual and set it on "3". Patient advised to call me back if this does not help with the dry mouth. Patient also notified that he needs a compliance visit ASAP. He was set up in April and should have been seen in July. Message will be sent to [facility] to change appointment from December to ASAP.

## 2022-01-13 DIAGNOSIS — M9901 Segmental and somatic dysfunction of cervical region: Secondary | ICD-10-CM | POA: Diagnosis not present

## 2022-01-13 DIAGNOSIS — M5136 Other intervertebral disc degeneration, lumbar region: Secondary | ICD-10-CM | POA: Diagnosis not present

## 2022-01-13 DIAGNOSIS — M5033 Other cervical disc degeneration, cervicothoracic region: Secondary | ICD-10-CM | POA: Diagnosis not present

## 2022-01-13 DIAGNOSIS — M9903 Segmental and somatic dysfunction of lumbar region: Secondary | ICD-10-CM | POA: Diagnosis not present

## 2022-01-14 ENCOUNTER — Other Ambulatory Visit: Payer: Self-pay | Admitting: Family Medicine

## 2022-01-14 ENCOUNTER — Ambulatory Visit: Payer: BC Managed Care – PPO | Admitting: Family Medicine

## 2022-01-14 ENCOUNTER — Encounter: Payer: Self-pay | Admitting: Family Medicine

## 2022-01-14 VITALS — BP 121/87 | HR 84 | Temp 98.1°F | Ht 70.0 in | Wt 235.4 lb

## 2022-01-14 DIAGNOSIS — M9901 Segmental and somatic dysfunction of cervical region: Secondary | ICD-10-CM | POA: Diagnosis not present

## 2022-01-14 DIAGNOSIS — J3489 Other specified disorders of nose and nasal sinuses: Secondary | ICD-10-CM

## 2022-01-14 DIAGNOSIS — M9903 Segmental and somatic dysfunction of lumbar region: Secondary | ICD-10-CM | POA: Diagnosis not present

## 2022-01-14 DIAGNOSIS — I700 Atherosclerosis of aorta: Secondary | ICD-10-CM

## 2022-01-14 DIAGNOSIS — M5136 Other intervertebral disc degeneration, lumbar region: Secondary | ICD-10-CM | POA: Diagnosis not present

## 2022-01-14 DIAGNOSIS — R5382 Chronic fatigue, unspecified: Secondary | ICD-10-CM | POA: Diagnosis not present

## 2022-01-14 DIAGNOSIS — Z23 Encounter for immunization: Secondary | ICD-10-CM | POA: Diagnosis not present

## 2022-01-14 DIAGNOSIS — M5033 Other cervical disc degeneration, cervicothoracic region: Secondary | ICD-10-CM | POA: Diagnosis not present

## 2022-01-14 NOTE — Progress Notes (Signed)
Subjective: CC: Follow-up fatigue, weight PCP: Janora Norlander, DO OMV:[facility] K Mccamish is a 56 y.o. male presenting to clinic today for:  1.  Encounter for weight loss management Patient was started on Ozempic for weight loss, associated with hyperlipidemia, sleep apnea and hypertension His insurance did not continue covering after he had obtained 2 mg dosage so he is now at a clinic in [place] getting this medication.  He continues to lose weight slowly.  Denies any abdominal pain, nausea or vomiting.  Reports some fatigue and in fact his specialist recommended that he have B12 testing done.  Has had history of low testosterone that was refractory to multiple modalities of treatment including topical and injection therapy.  He will may be pursuing some hair treatment and wants to get this level obtained prior to getting that done.  No chest pain, shortness of breath, edema or falls reported  Psychiatrist started him on Celexa in addition to the Wellbutrin, mirtazapine and Ativan that he is prescribed.  2. Sinusitis Recently treated with steroids.  He is using flonase.  Still having some sinus pressure but notes that drainage is now clear.  No fevers.  No hemoptysis.  Symptoms have been going on approximately 8 days now  ROS: Per HPI  Allergies  Allergen Reactions   Depo-Medrol [Methylprednisolone Sodium Succ] Rash   Past Medical History:  Diagnosis Date   Gout    Hallux rigidus 10/11/2018   right    HTN (hypertension)    Hyperlipidemia    Shortness of breath    Sleep apnea    had sx    Umbilical hernia     Current Outpatient Medications:    Budeson-Glycopyrrol-Formoterol (BREZTRI AEROSPHERE) 160-9-4.8 MCG/ACT AERO, Inhale 2 puffs into the lungs 2 (two) times daily., Disp: 10.7 g, Rfl: 11   buPROPion ER (WELLBUTRIN SR) 100 MG 12 hr tablet, Take 1 tablet (100 mg total) by mouth 2 (two) times daily., Disp: 90 tablet, Rfl: 3   famotidine (PEPCID) 20 MG tablet, Take 1 tablet  (20 mg total) by mouth 2 (two) times daily., Disp: 60 tablet, Rfl: 2   febuxostat (ULORIC) 40 MG tablet, TAKE 1 TABLET DAILY, Disp: 90 tablet, Rfl: 3   fluticasone (FLONASE) 50 MCG/ACT nasal spray, USE 2 SPRAYS IN EACH NOSTRIL DAILY, Disp: 48 g, Rfl: 1   guaiFENesin (MUCINEX) 600 MG 12 hr tablet, Take 1 tablet (600 mg total) by mouth 2 (two) times daily for 10 days., Disp: 20 tablet, Rfl: 0   icosapent Ethyl (VASCEPA) 1 g capsule, Take 2 capsules (2 g total) by mouth 2 (two) times daily., Disp: 360 capsule, Rfl: 3   loratadine (CLARITIN) 10 MG tablet, Take 1 tablet (10 mg total) by mouth daily., Disp: 90 tablet, Rfl: 2   LORazepam (ATIVAN) 0.5 MG tablet, , Disp: , Rfl:    metoprolol tartrate (LOPRESSOR) 25 MG tablet, Take 1 tablet (25 mg total) by mouth 2 (two) times daily., Disp: 180 tablet, Rfl: 3   mirtazapine (REMERON) 15 MG tablet, Take 15 mg by mouth at bedtime., Disp: , Rfl:    omeprazole (PRILOSEC) 40 MG capsule, TAKE 1 CAPSULE DAILY, Disp: 90 capsule, Rfl: 3   promethazine-dextromethorphan (PROMETHAZINE-DM) 6.25-15 MG/5ML syrup, Take 5 mLs by mouth in the morning and at bedtime., Disp: 240 mL, Rfl: 0   rosuvastatin (CRESTOR) 10 MG tablet, Take 1 tablet (10 mg total) by mouth daily., Disp: 90 tablet, Rfl: 3   Semaglutide, 2 MG/DOSE, 8 MG/3ML SOPN, Inject 2  mg as directed once a week., Disp: 9 mL, Rfl: 3   sildenafil (VIAGRA) 100 MG tablet, Take 0.5-1 tablets (50-100 mg total) by mouth daily as needed for erectile dysfunction., Disp: 5 tablet, Rfl: 11   Vitamin D, Ergocalciferol, (DRISDOL) 1.25 MG (50000 UNIT) CAPS capsule, TAKE 1 CAPSULE EVERY 7 DAYS, Disp: 12 capsule, Rfl: 3  Current Facility-Administered Medications:    cyanocobalamin ((VITAMIN B-12)) injection 1,000 mcg, 1,000 mcg, Intramuscular, Q30 days, Mikaia Janvier M, DO, 1,000 mcg at 05/28/20 1356 Social History   Socioeconomic History   Marital status: Married    Spouse name: Not on file   Number of children: Not on file    Years of education: Not on file   Highest education level: Not on file  Occupational History   Not on file  Tobacco Use   Smoking status: Some Days    Packs/day: 1.00    Years: 15.00    Total pack years: 15.00    Types: Cigarettes    Last attempt to quit: 2012    Years since quitting: 11.7   Smokeless tobacco: Never  Vaping Use   Vaping Use: Never used  Substance and Sexual Activity   Alcohol use: Yes    Comment: 2 drinks per month per pt.   Drug use: Not Currently    Types: Marijuana   Sexual activity: Not on file  Other Topics Concern   Not on file  Social History Narrative   Works at [employer] Strain: Not on [employer] Insecurity: Not on file  Transportation Needs: Not on file  Physical Activity: Not on file  Stress: Not on file  Social Connections: Not on file  Intimate Partner Violence: Not on file   Family History  Problem Relation Age of Onset   Healthy Mother    Heart attack Father    Healthy Daughter    Healthy Son    Colon cancer Neg Hx    Esophageal cancer Neg Hx    Rectal cancer Neg Hx    Stomach cancer Neg Hx     Objective: Office vital signs reviewed. BP 121/87   Pulse 84   Temp 98.1 F (36.7 C)   Ht '5\' 10"'  (1.778 m)   Wt 235 lb 6.4 oz (106.8 kg)   SpO2 93%   BMI 33.78 kg/m   Physical Examination:  General: Awake, alert, well nourished, No acute distress HEENT: Normal    Neck: No masses palpated. No lymphadenopathy    Ears: Tympanic membranes intact, normal light reflex, no erythema, no bulging    Eyes: PERRLA, extraocular membranes intact, sclera white    Nose: nasal turbinates moist, clear nasal discharge    Throat: moist mucus membranes, no erythema, no tonsillar exudate.  Airway is patent Cardio: regular rate and rhythm, S1S2 heard, no murmurs appreciated Pulm: clear to auscultation bilaterally, no wheezes, rhonchi or rales; normal work of breathing on room  air Extremities: warm, well perfused, No edema, cyanosis or clubbing; +2 pulses bilaterally MSK: Ambulating independently  Assessment/ Plan: 56 y.o. male   Chronic fatigue - Plan: Testosterone Total,Free,Bio, Males, CMP14+EGFR, TSH, Vitamin B12  Need for immunization against influenza - Plan: Flu Vaccine QUAD 54moIM (Fluarix, Fluzone & Alfiuria Quad PF)  [place]oracic aortic [place] - Plan: Lipid panel, CMP14+EGFR, TSH  Sinus pressure  Labs ordered.  She will come in before 10 AM to have these done.  He needs to fast  so that we may check lipid panel for him.    Influenza vaccination administered  Having some ongoing sinus pressure which I think is likely related to a viral illness that is resolving.  He has no signs or symptoms to suggest bacterial infection but we discussed that if symptoms linger past 2 weeks that we may consider empiric antibiotics.  Encourage sinus rinses, ongoing use of nasal spray, antihistamine.  Follow-up as needed this issue  No orders of the defined types were placed in this encounter.  No orders of the defined types were placed in this encounter.    Janora Norlander, DO [place] [phone]

## 2022-01-15 ENCOUNTER — Other Ambulatory Visit: Payer: BC Managed Care – PPO

## 2022-01-15 DIAGNOSIS — R5382 Chronic fatigue, unspecified: Secondary | ICD-10-CM | POA: Diagnosis not present

## 2022-01-15 DIAGNOSIS — M9903 Segmental and somatic dysfunction of lumbar region: Secondary | ICD-10-CM | POA: Diagnosis not present

## 2022-01-15 DIAGNOSIS — I700 Atherosclerosis of aorta: Secondary | ICD-10-CM

## 2022-01-15 DIAGNOSIS — M5136 Other intervertebral disc degeneration, lumbar region: Secondary | ICD-10-CM | POA: Diagnosis not present

## 2022-01-15 DIAGNOSIS — M5033 Other cervical disc degeneration, cervicothoracic region: Secondary | ICD-10-CM | POA: Diagnosis not present

## 2022-01-15 DIAGNOSIS — M9901 Segmental and somatic dysfunction of cervical region: Secondary | ICD-10-CM | POA: Diagnosis not present

## 2022-01-15 NOTE — Addendum Note (Signed)
Addended by: Janora Norlander on: 01/15/2022 08:43 AM   Modules accepted: Orders

## 2022-01-16 LAB — CMP14+EGFR
ALT: 38 IU/L (ref 0–44)
AST: 19 IU/L (ref 0–40)
Albumin/Globulin Ratio: 2.1 (ref 1.2–2.2)
Albumin: 4.2 g/dL (ref 3.8–4.9)
Alkaline Phosphatase: 74 IU/L (ref 44–121)
BUN/Creatinine Ratio: 12 (ref 9–20)
BUN: 13 mg/dL (ref 6–24)
Bilirubin Total: 0.4 mg/dL (ref 0.0–1.2)
CO2: 23 mmol/L (ref 20–29)
Calcium: 9.8 mg/dL (ref 8.7–10.2)
Chloride: 101 mmol/L (ref 96–106)
Creatinine, Ser: 1.06 mg/dL (ref 0.76–1.27)
Globulin, Total: 2 g/dL (ref 1.5–4.5)
Glucose: 93 mg/dL (ref 70–99)
Potassium: 4.2 mmol/L (ref 3.5–5.2)
Sodium: 139 mmol/L (ref 134–144)
Total Protein: 6.2 g/dL (ref 6.0–8.5)
eGFR: 83 mL/min/{1.73_m2} (ref >59)

## 2022-01-16 LAB — LIPID PANEL
Chol/HDL Ratio: 3.4 ratio (ref 0.0–5.0)
Cholesterol, Total: 101 mg/dL (ref 100–199)
HDL: 30 mg/dL — ABNORMAL LOW (ref >39)
LDL Chol Calc (NIH): 45 mg/dL (ref 0–99)
Triglycerides: 147 mg/dL (ref 0–149)
VLDL Cholesterol Cal: 26 mg/dL (ref 5–40)

## 2022-01-16 LAB — VITAMIN B12: Vitamin B-12: 476 pg/mL (ref 232–1245)

## 2022-01-16 LAB — TESTOSTERONE: Testosterone: 178 ng/dL — ABNORMAL LOW (ref 264–916)

## 2022-01-16 LAB — TSH: TSH: 2.43 u[IU]/mL (ref 0.450–4.500)

## 2022-01-20 DIAGNOSIS — M9903 Segmental and somatic dysfunction of lumbar region: Secondary | ICD-10-CM | POA: Diagnosis not present

## 2022-01-20 DIAGNOSIS — M9901 Segmental and somatic dysfunction of cervical region: Secondary | ICD-10-CM | POA: Diagnosis not present

## 2022-01-20 DIAGNOSIS — M5033 Other cervical disc degeneration, cervicothoracic region: Secondary | ICD-10-CM | POA: Diagnosis not present

## 2022-01-20 DIAGNOSIS — M5136 Other intervertebral disc degeneration, lumbar region: Secondary | ICD-10-CM | POA: Diagnosis not present

## 2022-01-22 ENCOUNTER — Other Ambulatory Visit: Payer: Self-pay | Admitting: Family Medicine

## 2022-01-22 ENCOUNTER — Encounter: Payer: Self-pay | Admitting: Family Medicine

## 2022-01-22 DIAGNOSIS — M9903 Segmental and somatic dysfunction of lumbar region: Secondary | ICD-10-CM | POA: Diagnosis not present

## 2022-01-22 DIAGNOSIS — M5136 Other intervertebral disc degeneration, lumbar region: Secondary | ICD-10-CM | POA: Diagnosis not present

## 2022-01-22 DIAGNOSIS — R5382 Chronic fatigue, unspecified: Secondary | ICD-10-CM

## 2022-01-22 DIAGNOSIS — M5033 Other cervical disc degeneration, cervicothoracic region: Secondary | ICD-10-CM | POA: Diagnosis not present

## 2022-01-22 DIAGNOSIS — M9901 Segmental and somatic dysfunction of cervical region: Secondary | ICD-10-CM | POA: Diagnosis not present

## 2022-01-22 DIAGNOSIS — R7989 Other specified abnormal findings of blood chemistry: Secondary | ICD-10-CM

## 2022-01-23 DIAGNOSIS — M9901 Segmental and somatic dysfunction of cervical region: Secondary | ICD-10-CM | POA: Diagnosis not present

## 2022-01-23 DIAGNOSIS — M9903 Segmental and somatic dysfunction of lumbar region: Secondary | ICD-10-CM | POA: Diagnosis not present

## 2022-01-23 DIAGNOSIS — M5033 Other cervical disc degeneration, cervicothoracic region: Secondary | ICD-10-CM | POA: Diagnosis not present

## 2022-01-23 DIAGNOSIS — M5136 Other intervertebral disc degeneration, lumbar region: Secondary | ICD-10-CM | POA: Diagnosis not present

## 2022-01-27 DIAGNOSIS — M5033 Other cervical disc degeneration, cervicothoracic region: Secondary | ICD-10-CM | POA: Diagnosis not present

## 2022-01-27 DIAGNOSIS — M5136 Other intervertebral disc degeneration, lumbar region: Secondary | ICD-10-CM | POA: Diagnosis not present

## 2022-01-27 DIAGNOSIS — M9903 Segmental and somatic dysfunction of lumbar region: Secondary | ICD-10-CM | POA: Diagnosis not present

## 2022-01-27 DIAGNOSIS — M9901 Segmental and somatic dysfunction of cervical region: Secondary | ICD-10-CM | POA: Diagnosis not present

## 2022-01-28 DIAGNOSIS — M9903 Segmental and somatic dysfunction of lumbar region: Secondary | ICD-10-CM | POA: Diagnosis not present

## 2022-01-28 DIAGNOSIS — M5033 Other cervical disc degeneration, cervicothoracic region: Secondary | ICD-10-CM | POA: Diagnosis not present

## 2022-01-28 DIAGNOSIS — M5136 Other intervertebral disc degeneration, lumbar region: Secondary | ICD-10-CM | POA: Diagnosis not present

## 2022-01-28 DIAGNOSIS — M9901 Segmental and somatic dysfunction of cervical region: Secondary | ICD-10-CM | POA: Diagnosis not present

## 2022-02-03 DIAGNOSIS — M5136 Other intervertebral disc degeneration, lumbar region: Secondary | ICD-10-CM | POA: Diagnosis not present

## 2022-02-03 DIAGNOSIS — M9903 Segmental and somatic dysfunction of lumbar region: Secondary | ICD-10-CM | POA: Diagnosis not present

## 2022-02-03 DIAGNOSIS — M5033 Other cervical disc degeneration, cervicothoracic region: Secondary | ICD-10-CM | POA: Diagnosis not present

## 2022-02-03 DIAGNOSIS — M9901 Segmental and somatic dysfunction of cervical region: Secondary | ICD-10-CM | POA: Diagnosis not present

## 2022-02-05 DIAGNOSIS — M9903 Segmental and somatic dysfunction of lumbar region: Secondary | ICD-10-CM | POA: Diagnosis not present

## 2022-02-05 DIAGNOSIS — M5033 Other cervical disc degeneration, cervicothoracic region: Secondary | ICD-10-CM | POA: Diagnosis not present

## 2022-02-05 DIAGNOSIS — M5136 Other intervertebral disc degeneration, lumbar region: Secondary | ICD-10-CM | POA: Diagnosis not present

## 2022-02-05 DIAGNOSIS — M9901 Segmental and somatic dysfunction of cervical region: Secondary | ICD-10-CM | POA: Diagnosis not present

## 2022-02-06 DIAGNOSIS — M9901 Segmental and somatic dysfunction of cervical region: Secondary | ICD-10-CM | POA: Diagnosis not present

## 2022-02-06 DIAGNOSIS — M5136 Other intervertebral disc degeneration, lumbar region: Secondary | ICD-10-CM | POA: Diagnosis not present

## 2022-02-06 DIAGNOSIS — M9903 Segmental and somatic dysfunction of lumbar region: Secondary | ICD-10-CM | POA: Diagnosis not present

## 2022-02-06 DIAGNOSIS — M5033 Other cervical disc degeneration, cervicothoracic region: Secondary | ICD-10-CM | POA: Diagnosis not present

## 2022-02-07 ENCOUNTER — Ambulatory Visit: Payer: BC Managed Care – PPO | Attending: Cardiovascular Disease | Admitting: Cardiovascular Disease

## 2022-02-07 ENCOUNTER — Encounter: Payer: Self-pay | Admitting: Cardiovascular Disease

## 2022-02-07 VITALS — BP 128/72 | HR 78 | Ht 70.5 in | Wt 244.2 lb

## 2022-02-07 DIAGNOSIS — G4733 Obstructive sleep apnea (adult) (pediatric): Secondary | ICD-10-CM

## 2022-02-07 DIAGNOSIS — E782 Mixed hyperlipidemia: Secondary | ICD-10-CM | POA: Diagnosis not present

## 2022-02-07 DIAGNOSIS — E6609 Other obesity due to excess calories: Secondary | ICD-10-CM

## 2022-02-07 DIAGNOSIS — I10 Essential (primary) hypertension: Secondary | ICD-10-CM

## 2022-02-07 DIAGNOSIS — I2510 Atherosclerotic heart disease of native coronary artery without angina pectoris: Secondary | ICD-10-CM | POA: Diagnosis not present

## 2022-02-07 DIAGNOSIS — Z6834 Body mass index (BMI) 34.0-34.9, adult: Secondary | ICD-10-CM

## 2022-02-07 NOTE — Patient Instructions (Signed)
Medication Instructions:  The current medical regimen is effective;  continue present plan and medications as directed. Please refer to the Current Medication list given to you today.   *If you need a refill on your cardiac medications before your next appointment, please call your pharmacy*  Lab Work: NONE  Follow-Up: At [facility], you and your health needs are our priority.  As part of our continuing mission to provide you with exceptional heart care, we have created designated Provider Care Teams.  These Care Teams include your primary Cardiologist (physician) and Advanced Practice Providers (APPs -  Physician Assistants and Nurse Practitioners) who all work together to provide you with the care you need, when you need it.  Your next appointment:   12 month(s)  The format for your next appointment:   In Person  Provider:   Shelva Majestic, MD   Important Information About Sugar

## 2022-02-07 NOTE — Progress Notes (Signed)
Cardiology Office Note    Date:  02/14/2022   ID:  Scott, Rasmussen [birth year], MRN [id number]  PCP:  Scott Norlander, DO  Cardiologist:  Scott Majestic, MD (sleep); Scott Rasmussen  New sleep consult referred by Scott Rasmussen  History of Present Illness:  Scott Rasmussen is a 56 y.o. male is followed by Scott Rasmussen for cardiology care and has a history of CAD, hypertension, and hyperlipidemia.  Scott Rasmussen tells me that he had undergone an initial sleep study in approximately 2004.  He was tried on CPAP but apparently did not do well and ultimately underwent UPPP surgery around 2005.  He apparently had a subsequent home study in 2018 and was told that this was "okay."  In 2022, due to concerns for obstructive sleep apnea he underwent a repeat home study on December 03, 2020.  He was found to have moderate overall sleep apnea with an AHI of 22.3/h with supine sleep AHI at 39.5/h and he had very severe oxygen desaturation  to a nadir of 66%.  He snored 21% of the night.  He subsequently underwent an in lab CPAP titration on January 15, 2021.  On that CPAP titration he was titrated up to 15 cm of water her AHI was 0 and O2 nadir 90%.  Unfortunately, due to supply chain issues involving semiconductor's originating in [place] he never received a new machine until December 09, 2021 when he received a new ResMed AirSense 11 AutoSet unit.  His initial pressures have been set at 15 cm.  A download was obtained from April 21 through Sep 07, 2021 which showed excellent compliance with 100% use however average use although compliant was suboptimal and only 5 hours and 36 minutes.  AHI was 1.4.  He typically goes to bed between 1030 and 11 but oftentimes may wake up for 5 hours later.  He is disabled.  A repeat download was obtained from September 20 through February 06, 2022.  He again is meeting compliance standards with usage days as well as usage greater than 4 hours at 80% but usage still remains  suboptimal and now is only 4 hours and 39 minutes.  AHI is excellent at 0.4.  An Epworth Sleepiness Scale score was calculated in the office today and this endorsed at 7 arguing against significant residual daytime sleepiness.  Presently, he is on a medical regimen consisting of bupropion and citalopram for anxiety/depression.  He is on rosuvastatin 10 mg and Vascepa 2 capsules twice a day for mixed hyperlipidemia.  He is on metoprolol tartrate 25 mg twice a day.  He was recently started on semaglutide for weight loss.  He presents for his initial evaluation and sleep consultation with me.  Past Medical History:  Diagnosis Date   Gout    Hallux rigidus 10/11/2018   right    HTN (hypertension)    Hyperlipidemia    Shortness of breath    Sleep apnea    had sx    Umbilical hernia     Past Surgical History:  Procedure Laterality Date   TONSILLECTOMY AND ADENOIDECTOMY  2008   UVULOPALATOPHARYNGOPLASTY  2008    Current Medications: Outpatient Medications Prior to Visit  Medication Sig Dispense Refill   buPROPion ER (WELLBUTRIN SR) 100 MG 12 hr tablet Take 1 tablet (100 mg total) by mouth 2 (two) times daily. 90 tablet 3   citalopram (CELEXA) 20 MG tablet Take 20 mg by mouth daily.  famotidine (PEPCID) 20 MG tablet Take 1 tablet (20 mg total) by mouth 2 (two) times daily. 60 tablet 2   febuxostat (ULORIC) 40 MG tablet TAKE 1 TABLET DAILY 90 tablet 3   fluticasone (FLONASE) 50 MCG/ACT nasal spray USE 2 SPRAYS IN EACH NOSTRIL DAILY 48 g 1   icosapent Ethyl (VASCEPA) 1 g capsule Take 2 capsules (2 g total) by mouth 2 (two) times daily. 360 capsule 3   loratadine (CLARITIN) 10 MG tablet Take 1 tablet (10 mg total) by mouth daily. 90 tablet 2   LORazepam (ATIVAN) 0.5 MG tablet      metoprolol tartrate (LOPRESSOR) 25 MG tablet Take 1 tablet (25 mg total) by mouth 2 (two) times daily. 180 tablet 3   omeprazole (PRILOSEC) 40 MG capsule TAKE 1 CAPSULE DAILY 90 capsule 3    promethazine-dextromethorphan (PROMETHAZINE-DM) 6.25-15 MG/5ML syrup Take 5 mLs by mouth in the morning and at bedtime. 240 mL 0   rosuvastatin (CRESTOR) 10 MG tablet Take 1 tablet (10 mg total) by mouth daily. 90 tablet 3   Semaglutide, 2 MG/DOSE, 8 MG/3ML SOPN Inject 2 mg as directed once a week. 9 mL 3   sildenafil (VIAGRA) 100 MG tablet Take 0.5-1 tablets (50-100 mg total) by mouth daily as needed for erectile dysfunction. 5 tablet 11   Vitamin D, Ergocalciferol, (DRISDOL) 1.25 MG (50000 UNIT) CAPS capsule TAKE 1 CAPSULE EVERY 7 DAYS 12 capsule 3   Budeson-Glycopyrrol-Formoterol (BREZTRI AEROSPHERE) 160-9-4.8 MCG/ACT AERO Inhale 2 puffs into the lungs 2 (two) times daily. (Patient not taking: Reported on 02/07/2022) 10.7 g 11   mirtazapine (REMERON) 15 MG tablet Take 15 mg by mouth at bedtime. (Patient not taking: Reported on 02/07/2022)     No facility-administered medications prior to visit.     Allergies:   Depo-medrol [methylprednisolone sodium succ]   Social History   Socioeconomic History   Marital status: Married    Spouse name: Not on file   Number of children: Not on file   Years of education: Not on file   Highest education level: Not on file  Occupational History   Not on file  Tobacco Use   Smoking status: Some Days    Packs/day: 1.00    Years: 15.00    Total pack years: 15.00    Types: Cigarettes    Last attempt to quit: 2012    Years since quitting: 11.8   Smokeless tobacco: Never  Vaping Use   Vaping Use: Never used  Substance and Sexual Activity   Alcohol use: Yes    Comment: 2 drinks per month per pt.   Drug use: Not Currently    Types: Marijuana   Sexual activity: Not on file  Other Topics Concern   Not on file  Social History Narrative   Works at [employer] Strain: Not on [employer] Insecurity: Not on file  Transportation Needs: Not on file  Physical Activity: Not on file  Stress: Not  on file  Social Connections: Not on file    Socially he was born in [place].  His first marriage was from 67 in 1996.  His second marriage from 63 97-2000 and he currently in his third marriage from 2003 which was short duration.  He has 2 children and 3 stepdaughters.  He is now in his fourth marriage.  He has a prior tobacco use but quit smoking in 2003. He previously had worked for  [employer].  Family History:  The patient's family history includes Healthy in his daughter, mother, and son; Heart attack in his father.  Mother is living.  Father died at age 80 with a heart attack   ROS General: Negative; No fevers, chills, or night sweats;  HEENT: Negative; No changes in vision or hearing, sinus congestion, difficulty swallowing Pulmonary: Negative; No cough, wheezing, shortness of breath, hemoptysis Cardiovascular: History of hypertension, hyperlipidemia; No chest pain, presyncope, syncope, palpitations GI: Negative; No nausea, vomiting, diarrhea, or abdominal pain GU: Negative; No dysuria, hematuria, or difficulty voiding Musculoskeletal: Negative; no myalgias, joint pain, or weakness Hematologic/Oncology: Negative; no easy bruising, bleeding Endocrine: Negative; no heat/cold intolerance; no diabetes Neuro: Negative; no changes in balance, headaches Skin: Negative; No rashes or skin lesions Psychiatric: Negative; No behavioral problems, depression Sleep: OSA originally diagnosed in 2000 07/2003 at which time he underwent UPPP surgery. Other comprehensive 14 point system review is negative.   PHYSICAL EXAM:   VS:  BP 128/72 (BP Location: Left Arm, Patient Position: Sitting, Cuff Size: Normal)   Pulse 78   Ht 5' 10.5" (1.791 m)   Wt 244 lb 3.2 oz (110.8 kg)   SpO2 96%   BMI 34.54 kg/m     Repeat blood pressure by me 122/80  Wt Readings from Last 3 Encounters:  02/07/22 244 lb 3.2 oz (110.8 kg)  01/14/22 235 lb 6.4 oz (106.8 kg)  12/10/21 241 lb (109.3 kg)     General: Alert, oriented, no distress.  Skin: normal turgor, no rashes, warm and dry HEENT: Normocephalic, atraumatic. Pupils equal round and reactive to light; sclera anicteric; extraocular muscles intact;  Nose without nasal septal hypertrophy Mouth/Parynx benign; status post UPPP surgery Neck: No JVD, no carotid bruits; normal carotid upstroke Lungs: clear to ausculatation and percussion; no wheezing or rales Chest wall: without tenderness to palpitation Heart: PMI not displaced, RRR, s1 s2 normal, 1/6 systolic murmur, no diastolic murmur, no rubs, gallops, thrills, or heaves Abdomen: soft, nontender; no hepatosplenomehaly, BS+; abdominal aorta nontender and not dilated by palpation. Back: no CVA tenderness Pulses 2+ Musculoskeletal: full range of motion, normal strength, no joint deformities Extremities: no clubbing cyanosis or edema, Homan's sign negative  Neurologic: grossly nonfocal; Cranial nerves grossly wnl Psychologic: Normal mood and affect   Studies/Labs Reviewed:   February 07, 2022 ECG (independently read by me): NSR at 78, no ectopy  Recent Labs:    Latest Ref Rng & Units 01/15/2022    8:49 AM 03/28/2021   11:11 AM 03/02/2020   11:11 AM  BMP  Glucose 70 - 99 mg/dL 93  98  84   BUN 6 - 24 mg/dL '13  16  16   ' Creatinine 0.76 - 1.27 mg/dL 1.06  0.94  1.17   BUN/Creat Ratio 9 - '20 12  17  14   ' Sodium 134 - 144 mmol/L 139  139  142   Potassium 3.5 - 5.2 mmol/L 4.2  4.4  4.8   Chloride 96 - 106 mmol/L 101  101  105   CO2 20 - 29 mmol/L '23  22  23   ' Calcium 8.7 - 10.2 mg/dL 9.8  10.0  10.2         Latest Ref Rng & Units 01/15/2022    8:49 AM 03/28/2021   11:11 AM 03/02/2020   11:11 AM  Hepatic Function  Total Protein 6.0 - 8.5 g/dL 6.2  7.2  6.9   Albumin 3.8 - 4.9 g/dL 4.2  4.8  4.8   AST 0 - 40 IU/L '19  27  23   ' ALT 0 - 44 IU/L 38  38  32   Alk Phosphatase 44 - 121 IU/L 74  74  69   Total Bilirubin 0.0 - 1.2 mg/dL 0.4  0.6  0.5        Latest Ref Rng &  Units 03/28/2021   11:11 AM 03/02/2020   11:11 AM 04/26/2019    8:46 AM  CBC  WBC 3.4 - 10.8 x10E3/uL 6.0  6.5  5.8   Hemoglobin 13.0 - 17.7 g/dL 17.7  18.0  17.7   Hematocrit 37.5 - 51.0 % 50.4  51.3  50.5   Platelets 150 - 450 x10E3/uL 262  293  303    Lab Results  Component Value Date   MCV 84 03/28/2021   MCV 85 03/02/2020   MCV 85 04/26/2019   Lab Results  Component Value Date   TSH 2.430 01/15/2022   Lab Results  Component Value Date   HGBA1C 5.5 04/26/2019     BNP No results found for: "BNP"  ProBNP No results found for: "PROBNP"   Lipid Panel     Component Value Date/Time   CHOL 101 01/15/2022 0849   TRIG 147 01/15/2022 0849   TRIG 242 (H) 10/17/2016 0806   HDL 30 (L) 01/15/2022 0849   HDL 39 (L) 10/17/2016 0806   CHOLHDL 3.4 01/15/2022 0849   LDLCALC 45 01/15/2022 0849   LDLCALC 86 11/28/2013 1024   LDLDIRECT 59 07/03/2020 1108   LABVLDL 26 01/15/2022 0849     RADIOLOGY: No results found.   Additional studies/ records that were reviewed today include:    Patient Name: Scott Rasmussen, Scott Rasmussen Date: 12/03/2020 Gender: Male D.O.B: 1966-01-27 Age (years): 55 Referring Provider: Cassie Freer ONeal Height (inches): 41 Interpreting Physician: Scott Majestic MD, ABSM Weight (lbs): 232 RPSGT: Jacolyn Reedy BMI: 33 MRN: [id number] Neck Size: 18.75   CLINICAL INFORMATION Sleep Study Type: HST   Indication for sleep study: snoring,   Epworth Sleepiness Score: 3   SLEEP STUDY TECHNIQUE A multi-channel overnight portable sleep study was performed. The channels recorded were: nasal airflow, thoracic respiratory movement, and oxygen saturation with a pulse oximetry. Snoring was also monitored.   MEDICATIONS buPROPion (WELLBUTRIN SR) 100 MG 12 hr tablet Coenzyme Q10 (COQ-10) 100 MG CAPS DULoxetine (CYMBALTA) 60 MG capsule eszopiclone (LUNESTA) 1 MG TABS tablet febuxostat (ULORIC) 40 MG tablet fluticasone (FLONASE) 50 MCG/ACT nasal  spray icosapent Ethyl (VASCEPA) 1 g capsule LORazepam (ATIVAN) 0.5 MG tablet metoprolol tartrate (LOPRESSOR) 25 MG tablet omeprazole (PRILOSEC) 40 MG capsule rosuvastatin (CRESTOR) 10 MG tablet (Expired) sildenafil (VIAGRA) 100 MG tablet Vitamin D, Ergocalciferol, (DRISDOL) 1.25 MG (50000 UNIT) CAPS capsule  Patient self administered medications include: N/A.   SLEEP ARCHITECTURE Patient was studied for 338.7 minutes. The sleep efficiency was 100.0 % and the patient was supine for 48.9%. The arousal index was 0.0 per hour.   RESPIRATORY PARAMETERS The overall AHI was 22.3 per hour, with a central apnea index of 0 per hour. There is a significant positional component with supine sleep AHI 39.5/h versus non-supine sleep AHI 6.3/h.   The oxygen nadir was 66% during sleep.   CARDIAC DATA Mean heart rate during sleep was 79.7 bpm.   IMPRESSIONS - Moderate obstructive sleep apnea occurred during this study (AHI 22.3/h). - Severe oxygen desaturation to a nadir of 66%.  - Patient snored 21.0% during the sleep.   DIAGNOSIS -  Obstructive Sleep Apnea (G47.33) - Nocturnal Hypoxemia (G47.36)   RECOMMENDATIONS - In this patient with cardiovascular comorbidities recommend a CPAP titration study. If unable to obtain an in-lab study initiate Auto-PAP with EPR at 7 - 18 cm of water and heated humidifiation.  - Effort should be made to optimize nasal and oropharyngeal patency.   - Positional therapy avoiding supine position during sleep. - Avoid alcohol, sedatives and other CNS depressants that may worsen sleep apnea and disrupt normal sleep architecture. - Sleep hygiene should be reviewed to assess factors that may improve sleep quality. - Weight management [facility]) and regular exercise should be initiated or continued. - Recommend a download and sleep clinic evaluation after one month of therapy.     -------------------------------------------------------------------------------------------------------------------  01/15/2021  CLINICAL INFORMATION The patient is referred for a CPAP titration to treat sleep apnea.   Date of HST: 12/03/2020: AHI 22.3/h with supine AHI 39.5/h; non-supine AHI 6.3/h; O2 nadir 66%.   SLEEP STUDY TECHNIQUE As per the AASM Manual for the Scoring of Sleep and Associated Events v2.3 (April 2016) with a hypopnea requiring 4% desaturations.   The channels recorded and monitored were frontal, central and occipital EEG, electrooculogram (EOG), submentalis EMG (chin), nasal and oral airflow, thoracic and abdominal wall motion, anterior tibialis EMG, snore microphone, electrocardiogram, and pulse oximetry. Continuous positive airway pressure (CPAP) was initiated at the beginning of the study and titrated to treat sleep-disordered breathing.   MEDICATIONS fluticasone (FLONASE) 50 MCG/ACT nasal spray buPROPion (WELLBUTRIN SR) 100 MG 12 hr tablet Coenzyme Q10 (COQ-10) 100 MG CAPS DULoxetine (CYMBALTA) 60 MG capsule eszopiclone (LUNESTA) 1 MG TABS tablet febuxostat (ULORIC) 40 MG tablet icosapent Ethyl (VASCEPA) 1 g capsule LORazepam (ATIVAN) 0.5 MG tablet metoprolol tartrate (LOPRESSOR) 25 MG tablet omeprazole (PRILOSEC) 40 MG capsule rosuvastatin (CRESTOR) 10 MG tablet (Expired) sildenafil (VIAGRA) 100 MG tablet SYRINGE-NEEDLE, DISP, 3 ML (BD INTEGRA SYRINGE) 25G X 1" 3 ML MISC Vitamin D, Ergocalciferol, (DRISDOL) 1.25 MG (50000 UNIT) CAPS capsule Medications self-administered by patient taken the night of the study : LUNESTA   TECHNICIAN COMMENTS Comments added by technician: None Comments added by scorer: N/A   RESPIRATORY PARAMETERS Optimal PAP Pressure (cm):  15        AHI at Optimal Pressure (/hr):            0 Overall Minimal O2 (%):         85.0     Supine % at Optimal Pressure (%):    33 Minimal O2 at Optimal Pressure (%): 90.0        SLEEP  ARCHITECTURE The study was initiated at 9:54:45 PM and ended at 4:54:47 AM.   Sleep onset time was 27.7 minutes and the sleep efficiency was 86.9%%. The total sleep time was 365 minutes.   The patient spent 4.1%% of the night in stage N1 sleep, 68.4%% in stage N2 sleep, 0.0%% in stage N3 and 27.5% in REM.Stage REM latency was 119.0 minutes   Wake after sleep onset was 27.3. Alpha intrusion was absent. Supine sleep was 71.37%.   CARDIAC DATA The 2 lead EKG demonstrated sinus rhythm. The mean heart rate was 75.5 beats per minute. Other EKG findings include: None.   LEG MOVEMENT DATA The total Periodic Limb Movements of Sleep (PLMS) were 0. The PLMS index was 0.0. A PLMS index of <15 is considered normal in adults.   IMPRESSIONS - CPAP was initiated at 5 cm and was titrated to optimal PAP pressure at 15 cm of  water (AHI 0, O2 nadir 90%). - Central sleep apnea was not noted during this titration (CAI = 0.5/h). - Moderate oxygen desaturations to a nadir of 85% at 9 cm of water. - Snoring was eliminated with CPAP. - No cardiac abnormalities were observed during this study. - Clinically significant periodic limb movements were not noted during this study. Arousals associated with PLMs were rare.   DIAGNOSIS - Obstructive Sleep Apnea (G47.33)   RECOMMENDATIONS - Recommend an initial trial of CPAP therapy with EPR of 3 at 15 cm H2O with heated humidification.  A Medium size Fisher&Paykel Full Face Mask F&P Vitera (new) mask was used for the titration. - Effort should be made to optimize nasal and oropharyngeal patency. - Avoid alcohol, sedatives and other CNS depressants that may worsen sleep apnea and disrupt normal sleep architecture. - Sleep hygiene should be reviewed to assess factors that may improve sleep quality. - Weight management (BMI 34) and regular exercise should be initiated or continued. - Recommend a download in 30 days and sleep clinic evaluation after 4 weeks of therapy.      ASSESSMENT:    1. OSA (obstructive sleep apnea)   2. Coronary artery disease involving native coronary artery of native heart without angina pectoris   3. Mixed hyperlipidemia   4. Primary hypertension   5. Class 1 obesity due to excess calories with serious comorbidity and body mass index (BMI) of 34.0 to 34.9 in adult     PLAN:  Scott Rasmussen is a 56 year old gentleman who has a history of CAD, hypertension, hyperlipidemia, anxiety/depression with PTSD.  He has a history of obstructive sleep apnea which was originally diagnosed around 2000 07/2003 and he tells me his AHI was 49/h at that time.  Apparently he may not have tolerated CPAP at that time and underwent UPPP surgery.  He tells me he apparently had another sleep study in 2018 and he was told this was "okay."  Recently, due to symptoms of sleep apnea including snoring, nocturia at least 2 times per night, nonrestorative sleep, and daytime sleepiness he underwent sleep evaluations in 2022 which confirmed moderate overall sleep apnea however events were severe with supine sleep and he had severe oxygen desaturation to a nadir of 66%.  He ultimately underwent CPAP titration on January 15, 2021 and was titrated up to 15 cm of water with excellent response with AHI of 0 and O2 nadir at 90%.  Unfortunately due to supply chain issues he had to wait until August 09, 2021 to receive his new ResMed AirSense 11 AutoSet machine.  He has been compliant with excellent usage since initiating therapy however on both downloads from the initial month and his most recent month sleep duration although meeting compliance standards is suboptimal at 5 hours and 36 minutes and his initial download and even less on his most recent download from September 20 through February 06, 2022 and only 4 hours and 39 minutes.  However at his set pressure of 15 cm AHI is excellent at 0.4.  I had a very lengthy discussion with him today in the office.  I reviewed  normal sleep architecture and potential disruptive sleep architecture associated with untreated sleep apnea.  In addition I discussed its potential adverse cardiovascular consequences including its effect on blood pressure control, potential for nocturnal arrhythmias, increased risk for atrial fibrillation.  In addition I discussed its negative effect on insulin resistance, increased inflammatory biomarkers, and increased potential for nocturnal GERD.  I discussed potential  nocturnal hypoxemia contributing to nocturnal dyspnea particularly with his underlying heart disease.  Although he is compliant with usage days stressed the importance of optimal sleep duration for gentleman his age should be 7 to 9 hours per night.  He is obese and I discussed the importance of weight loss and increased exercise.  His AHI is excellent at 15 but to allow some flexibility I will change his CPAP from a set pressure to a auto pressure with a range of 15 to 20 cm in the event he needs higher pressures particularly with his supine sleep.  Since initiating CPAP he is sleeping better.  He does note that his previous GERD at night has improved.  As long as he is stable, I will see him in 1 year for reevaluation or sooner as needed.   Medication Adjustments/Labs and Tests Ordered: Current medicines are reviewed at length with the patient today.  Concerns regarding medicines are outlined above.  Medication changes, Labs and Tests ordered today are listed in the Patient Instructions below. Patient Instructions  Medication Instructions:  The current medical regimen is effective;  continue present plan and medications as directed. Please refer to the Current Medication list given to you today.   *If you need a refill on your cardiac medications before your next appointment, please call your pharmacy*  Lab Work: NONE  Follow-Up: At [facility], you and your health needs are our priority.  As part of our continuing  mission to provide you with exceptional heart care, we have created designated Provider Care Teams.  These Care Teams include your primary Cardiologist (physician) and Advanced Practice Providers (APPs -  Physician Assistants and Nurse Practitioners) who all work together to provide you with the care you need, when you need it.  Your next appointment:   12 month(s)  The format for your next appointment:   In Person  Provider:   Shelva Majestic, MD   Important Information About Sugar         Signed, Scott Majestic, MD, [facility], [place], American Board of Sleep Medicine  02/14/2022 5:39 PM    [facility] [address], [place], [place], [place]  [address] Phone: [phone]

## 2022-02-10 ENCOUNTER — Telehealth: Payer: Self-pay | Admitting: Family Medicine

## 2022-02-10 ENCOUNTER — Encounter: Payer: Self-pay | Admitting: Family Medicine

## 2022-02-10 DIAGNOSIS — M9901 Segmental and somatic dysfunction of cervical region: Secondary | ICD-10-CM | POA: Diagnosis not present

## 2022-02-10 DIAGNOSIS — M5033 Other cervical disc degeneration, cervicothoracic region: Secondary | ICD-10-CM | POA: Diagnosis not present

## 2022-02-10 DIAGNOSIS — M9903 Segmental and somatic dysfunction of lumbar region: Secondary | ICD-10-CM | POA: Diagnosis not present

## 2022-02-10 DIAGNOSIS — M5136 Other intervertebral disc degeneration, lumbar region: Secondary | ICD-10-CM | POA: Diagnosis not present

## 2022-02-10 NOTE — Telephone Encounter (Signed)
Lmtcb.

## 2022-02-12 DIAGNOSIS — M9903 Segmental and somatic dysfunction of lumbar region: Secondary | ICD-10-CM | POA: Diagnosis not present

## 2022-02-12 DIAGNOSIS — M5136 Other intervertebral disc degeneration, lumbar region: Secondary | ICD-10-CM | POA: Diagnosis not present

## 2022-02-12 DIAGNOSIS — M9901 Segmental and somatic dysfunction of cervical region: Secondary | ICD-10-CM | POA: Diagnosis not present

## 2022-02-12 DIAGNOSIS — M5033 Other cervical disc degeneration, cervicothoracic region: Secondary | ICD-10-CM | POA: Diagnosis not present

## 2022-02-14 ENCOUNTER — Encounter: Payer: Self-pay | Admitting: Cardiovascular Disease

## 2022-02-19 DIAGNOSIS — M5136 Other intervertebral disc degeneration, lumbar region: Secondary | ICD-10-CM | POA: Diagnosis not present

## 2022-02-19 DIAGNOSIS — M9903 Segmental and somatic dysfunction of lumbar region: Secondary | ICD-10-CM | POA: Diagnosis not present

## 2022-02-19 DIAGNOSIS — M9901 Segmental and somatic dysfunction of cervical region: Secondary | ICD-10-CM | POA: Diagnosis not present

## 2022-02-19 DIAGNOSIS — M5033 Other cervical disc degeneration, cervicothoracic region: Secondary | ICD-10-CM | POA: Diagnosis not present

## 2022-02-19 NOTE — Telephone Encounter (Signed)
Patient aware he has an appointment on 04/11/22.

## 2022-02-20 DIAGNOSIS — M9903 Segmental and somatic dysfunction of lumbar region: Secondary | ICD-10-CM | POA: Diagnosis not present

## 2022-02-20 DIAGNOSIS — M5136 Other intervertebral disc degeneration, lumbar region: Secondary | ICD-10-CM | POA: Diagnosis not present

## 2022-02-20 DIAGNOSIS — M5033 Other cervical disc degeneration, cervicothoracic region: Secondary | ICD-10-CM | POA: Diagnosis not present

## 2022-02-20 DIAGNOSIS — M9901 Segmental and somatic dysfunction of cervical region: Secondary | ICD-10-CM | POA: Diagnosis not present

## 2022-02-24 DIAGNOSIS — M9901 Segmental and somatic dysfunction of cervical region: Secondary | ICD-10-CM | POA: Diagnosis not present

## 2022-02-24 DIAGNOSIS — M9903 Segmental and somatic dysfunction of lumbar region: Secondary | ICD-10-CM | POA: Diagnosis not present

## 2022-02-24 DIAGNOSIS — M5136 Other intervertebral disc degeneration, lumbar region: Secondary | ICD-10-CM | POA: Diagnosis not present

## 2022-02-24 DIAGNOSIS — M5033 Other cervical disc degeneration, cervicothoracic region: Secondary | ICD-10-CM | POA: Diagnosis not present

## 2022-02-25 DIAGNOSIS — M5033 Other cervical disc degeneration, cervicothoracic region: Secondary | ICD-10-CM | POA: Diagnosis not present

## 2022-02-25 DIAGNOSIS — M9903 Segmental and somatic dysfunction of lumbar region: Secondary | ICD-10-CM | POA: Diagnosis not present

## 2022-02-25 DIAGNOSIS — M9901 Segmental and somatic dysfunction of cervical region: Secondary | ICD-10-CM | POA: Diagnosis not present

## 2022-02-25 DIAGNOSIS — M5136 Other intervertebral disc degeneration, lumbar region: Secondary | ICD-10-CM | POA: Diagnosis not present

## 2022-03-03 DIAGNOSIS — M5033 Other cervical disc degeneration, cervicothoracic region: Secondary | ICD-10-CM | POA: Diagnosis not present

## 2022-03-03 DIAGNOSIS — M9901 Segmental and somatic dysfunction of cervical region: Secondary | ICD-10-CM | POA: Diagnosis not present

## 2022-03-03 DIAGNOSIS — M5136 Other intervertebral disc degeneration, lumbar region: Secondary | ICD-10-CM | POA: Diagnosis not present

## 2022-03-03 DIAGNOSIS — M9903 Segmental and somatic dysfunction of lumbar region: Secondary | ICD-10-CM | POA: Diagnosis not present

## 2022-03-05 DIAGNOSIS — M9901 Segmental and somatic dysfunction of cervical region: Secondary | ICD-10-CM | POA: Diagnosis not present

## 2022-03-05 DIAGNOSIS — M5033 Other cervical disc degeneration, cervicothoracic region: Secondary | ICD-10-CM | POA: Diagnosis not present

## 2022-03-05 DIAGNOSIS — M9903 Segmental and somatic dysfunction of lumbar region: Secondary | ICD-10-CM | POA: Diagnosis not present

## 2022-03-05 DIAGNOSIS — M5136 Other intervertebral disc degeneration, lumbar region: Secondary | ICD-10-CM | POA: Diagnosis not present

## 2022-03-06 DIAGNOSIS — Z4789 Encounter for other orthopedic aftercare: Secondary | ICD-10-CM | POA: Diagnosis not present

## 2022-03-06 DIAGNOSIS — M542 Cervicalgia: Secondary | ICD-10-CM | POA: Diagnosis not present

## 2022-03-10 DIAGNOSIS — M5136 Other intervertebral disc degeneration, lumbar region: Secondary | ICD-10-CM | POA: Diagnosis not present

## 2022-03-10 DIAGNOSIS — M5416 Radiculopathy, lumbar region: Secondary | ICD-10-CM | POA: Diagnosis not present

## 2022-03-10 DIAGNOSIS — M542 Cervicalgia: Secondary | ICD-10-CM | POA: Diagnosis not present

## 2022-03-10 DIAGNOSIS — M5459 Other low back pain: Secondary | ICD-10-CM | POA: Diagnosis not present

## 2022-03-12 DIAGNOSIS — M9901 Segmental and somatic dysfunction of cervical region: Secondary | ICD-10-CM | POA: Diagnosis not present

## 2022-03-12 DIAGNOSIS — M5033 Other cervical disc degeneration, cervicothoracic region: Secondary | ICD-10-CM | POA: Diagnosis not present

## 2022-03-12 DIAGNOSIS — M5136 Other intervertebral disc degeneration, lumbar region: Secondary | ICD-10-CM | POA: Diagnosis not present

## 2022-03-12 DIAGNOSIS — M9903 Segmental and somatic dysfunction of lumbar region: Secondary | ICD-10-CM | POA: Diagnosis not present

## 2022-03-17 DIAGNOSIS — M5033 Other cervical disc degeneration, cervicothoracic region: Secondary | ICD-10-CM | POA: Diagnosis not present

## 2022-03-17 DIAGNOSIS — M9903 Segmental and somatic dysfunction of lumbar region: Secondary | ICD-10-CM | POA: Diagnosis not present

## 2022-03-17 DIAGNOSIS — M5136 Other intervertebral disc degeneration, lumbar region: Secondary | ICD-10-CM | POA: Diagnosis not present

## 2022-03-17 DIAGNOSIS — M9901 Segmental and somatic dysfunction of cervical region: Secondary | ICD-10-CM | POA: Diagnosis not present

## 2022-03-19 DIAGNOSIS — M9903 Segmental and somatic dysfunction of lumbar region: Secondary | ICD-10-CM | POA: Diagnosis not present

## 2022-03-19 DIAGNOSIS — M9901 Segmental and somatic dysfunction of cervical region: Secondary | ICD-10-CM | POA: Diagnosis not present

## 2022-03-19 DIAGNOSIS — M5136 Other intervertebral disc degeneration, lumbar region: Secondary | ICD-10-CM | POA: Diagnosis not present

## 2022-03-19 DIAGNOSIS — M5033 Other cervical disc degeneration, cervicothoracic region: Secondary | ICD-10-CM | POA: Diagnosis not present

## 2022-03-24 ENCOUNTER — Other Ambulatory Visit: Payer: Self-pay | Admitting: Family Medicine

## 2022-03-24 DIAGNOSIS — M5136 Other intervertebral disc degeneration, lumbar region: Secondary | ICD-10-CM | POA: Diagnosis not present

## 2022-03-24 DIAGNOSIS — E781 Pure hyperglyceridemia: Secondary | ICD-10-CM

## 2022-03-24 DIAGNOSIS — M5033 Other cervical disc degeneration, cervicothoracic region: Secondary | ICD-10-CM | POA: Diagnosis not present

## 2022-03-24 DIAGNOSIS — M9901 Segmental and somatic dysfunction of cervical region: Secondary | ICD-10-CM | POA: Diagnosis not present

## 2022-03-24 DIAGNOSIS — M9903 Segmental and somatic dysfunction of lumbar region: Secondary | ICD-10-CM | POA: Diagnosis not present

## 2022-03-26 DIAGNOSIS — M9903 Segmental and somatic dysfunction of lumbar region: Secondary | ICD-10-CM | POA: Diagnosis not present

## 2022-03-26 DIAGNOSIS — M5136 Other intervertebral disc degeneration, lumbar region: Secondary | ICD-10-CM | POA: Diagnosis not present

## 2022-03-26 DIAGNOSIS — M9901 Segmental and somatic dysfunction of cervical region: Secondary | ICD-10-CM | POA: Diagnosis not present

## 2022-03-26 DIAGNOSIS — M5033 Other cervical disc degeneration, cervicothoracic region: Secondary | ICD-10-CM | POA: Diagnosis not present

## 2022-03-31 DIAGNOSIS — M9903 Segmental and somatic dysfunction of lumbar region: Secondary | ICD-10-CM | POA: Diagnosis not present

## 2022-03-31 DIAGNOSIS — M5136 Other intervertebral disc degeneration, lumbar region: Secondary | ICD-10-CM | POA: Diagnosis not present

## 2022-03-31 DIAGNOSIS — M9901 Segmental and somatic dysfunction of cervical region: Secondary | ICD-10-CM | POA: Diagnosis not present

## 2022-03-31 DIAGNOSIS — M5033 Other cervical disc degeneration, cervicothoracic region: Secondary | ICD-10-CM | POA: Diagnosis not present

## 2022-04-03 DIAGNOSIS — M5136 Other intervertebral disc degeneration, lumbar region: Secondary | ICD-10-CM | POA: Diagnosis not present

## 2022-04-03 DIAGNOSIS — M5033 Other cervical disc degeneration, cervicothoracic region: Secondary | ICD-10-CM | POA: Diagnosis not present

## 2022-04-03 DIAGNOSIS — M9903 Segmental and somatic dysfunction of lumbar region: Secondary | ICD-10-CM | POA: Diagnosis not present

## 2022-04-03 DIAGNOSIS — M9901 Segmental and somatic dysfunction of cervical region: Secondary | ICD-10-CM | POA: Diagnosis not present

## 2022-04-07 ENCOUNTER — Ambulatory Visit: Payer: BC Managed Care – PPO | Admitting: Cardiovascular Disease

## 2022-04-09 DIAGNOSIS — M5136 Other intervertebral disc degeneration, lumbar region: Secondary | ICD-10-CM | POA: Diagnosis not present

## 2022-04-09 DIAGNOSIS — M9901 Segmental and somatic dysfunction of cervical region: Secondary | ICD-10-CM | POA: Diagnosis not present

## 2022-04-09 DIAGNOSIS — M9903 Segmental and somatic dysfunction of lumbar region: Secondary | ICD-10-CM | POA: Diagnosis not present

## 2022-04-09 DIAGNOSIS — M5033 Other cervical disc degeneration, cervicothoracic region: Secondary | ICD-10-CM | POA: Diagnosis not present

## 2022-04-10 DIAGNOSIS — F4311 Post-traumatic stress disorder, acute: Secondary | ICD-10-CM | POA: Diagnosis not present

## 2022-04-10 DIAGNOSIS — F411 Generalized anxiety disorder: Secondary | ICD-10-CM | POA: Diagnosis not present

## 2022-04-10 DIAGNOSIS — F330 Major depressive disorder, recurrent, mild: Secondary | ICD-10-CM | POA: Diagnosis not present

## 2022-04-11 ENCOUNTER — Encounter: Payer: Self-pay | Admitting: Family Medicine

## 2022-04-11 ENCOUNTER — Ambulatory Visit (INDEPENDENT_AMBULATORY_CARE_PROVIDER_SITE_OTHER): Payer: BC Managed Care – PPO | Admitting: Family Medicine

## 2022-04-11 VITALS — BP 129/73 | HR 101 | Temp 98.6°F | Ht 70.5 in | Wt 244.2 lb

## 2022-04-11 DIAGNOSIS — Z0001 Encounter for general adult medical examination with abnormal findings: Secondary | ICD-10-CM | POA: Diagnosis not present

## 2022-04-11 DIAGNOSIS — Z8639 Personal history of other endocrine, nutritional and metabolic disease: Secondary | ICD-10-CM

## 2022-04-11 DIAGNOSIS — R7989 Other specified abnormal findings of blood chemistry: Secondary | ICD-10-CM

## 2022-04-11 DIAGNOSIS — Z5181 Encounter for therapeutic drug level monitoring: Secondary | ICD-10-CM | POA: Diagnosis not present

## 2022-04-11 DIAGNOSIS — N521 Erectile dysfunction due to diseases classified elsewhere: Secondary | ICD-10-CM

## 2022-04-11 DIAGNOSIS — I700 Atherosclerosis of aorta: Secondary | ICD-10-CM | POA: Diagnosis not present

## 2022-04-11 DIAGNOSIS — I2510 Atherosclerotic heart disease of native coronary artery without angina pectoris: Secondary | ICD-10-CM

## 2022-04-11 DIAGNOSIS — Z0000 Encounter for general adult medical examination without abnormal findings: Secondary | ICD-10-CM

## 2022-04-11 DIAGNOSIS — Z8719 Personal history of other diseases of the digestive system: Secondary | ICD-10-CM | POA: Diagnosis not present

## 2022-04-11 DIAGNOSIS — E781 Pure hyperglyceridemia: Secondary | ICD-10-CM

## 2022-04-11 LAB — BAYER DCA HB A1C WAIVED: HB A1C (BAYER DCA - WAIVED): 5.6 % (ref 4.8–5.6)

## 2022-04-11 MED ORDER — OMEPRAZOLE 40 MG PO CPDR: 40.0000 mg | DELAYED_RELEASE_CAPSULE | Freq: Every day | ORAL | 3 refills | Status: DC

## 2022-04-11 MED ORDER — VITAMIN D (ERGOCALCIFEROL) 1.25 MG (50000 UNIT) PO CAPS: ORAL_CAPSULE | ORAL | 3 refills | Status: DC

## 2022-04-11 MED ORDER — LORATADINE 10 MG PO TABS: 10.0000 mg | ORAL_TABLET | Freq: Every day | ORAL | 3 refills | Status: DC

## 2022-04-11 MED ORDER — HYDROCORTISONE (PERIANAL) 2.5 % EX CREA: 1.0000 | TOPICAL_CREAM | Freq: Two times a day (BID) | CUTANEOUS | 0 refills | Status: DC | PRN

## 2022-04-11 MED ORDER — ROSUVASTATIN CALCIUM 10 MG PO TABS: 10.0000 mg | ORAL_TABLET | Freq: Every day | ORAL | 3 refills | Status: DC

## 2022-04-11 MED ORDER — BUPROPION HCL ER (SR) 100 MG PO TB12: 100.0000 mg | ORAL_TABLET | Freq: Two times a day (BID) | ORAL | 3 refills | Status: DC

## 2022-04-11 MED ORDER — FLUTICASONE PROPIONATE 50 MCG/ACT NA SUSP: 2.0000 | Freq: Every day | NASAL | 1 refills | Status: DC

## 2022-04-11 MED ORDER — SILDENAFIL CITRATE 100 MG PO TABS: 50.0000 mg | ORAL_TABLET | Freq: Every day | ORAL | 11 refills | Status: DC | PRN

## 2022-04-11 MED ORDER — METOPROLOL TARTRATE 25 MG PO TABS: 25.0000 mg | ORAL_TABLET | Freq: Two times a day (BID) | ORAL | 3 refills | Status: DC

## 2022-04-11 MED ORDER — FEBUXOSTAT 40 MG PO TABS: 40.0000 mg | ORAL_TABLET | Freq: Every day | ORAL | 3 refills | Status: DC

## 2022-04-11 NOTE — Progress Notes (Signed)
Scott Rasmussen is a 56 y.o. male presents to office today for annual physical exam examination.    He reports that overall he is doing okay.  Recently had Seroquel added by his psychiatrist, Dr. Maryruth Bun.  He continues to take Wellbutrin, Celexa and Ativan as prescribed.  They asked that we get A1c and lipid panel given this initiation of medicine.  He has been on it for roughly 2 weeks and notes that it does feel like it causes some excessive daytime sedation.  He is trying to hang with it until he can follow-up with that specialist.  He sees them 1 time per month and sees a counselor every 3 weeks.  He unfortunately did not make the appointment to endocrinology and is going to schedule an appointment with his urologist.  He does admit to some urinary hesitancy denies any dysuria, hematuria.  Additionally, he does report having seen some scant blood in stool recently.  Does not report any abdominal pain, night sweats, unplanned weight loss, nausea, vomiting.  Thinks this may have been a hemorrhoid because he felt a small bulge at his rectum but that is since resolved.  No rectal itching reported.  Marital status: married Diet: typical american (less appetite since starting mounjaro/zepbound) Last colonoscopy: 07/2017 Refills needed today: would like rx for all, as his insurance is changing next year. Immunizations needed: Immunization History  Administered Date(s) Administered   Influenza,inj,Quad PF,6+ Mos 02/21/2013, 01/16/2014, 01/30/2015, 03/03/2016, 01/27/2017, 02/16/2018, 01/07/2019, 01/11/2020, 03/27/2021, 01/14/2022   PFIZER(Purple Top)SARS-COV-2 Vaccination 07/14/2019, 08/08/2019, 05/02/2020   Tdap 11/16/2012   Zoster Recombinat (Shingrix) 03/22/2018, 06/14/2018     Past Medical History:  Diagnosis Date   Gout    Hallux rigidus 10/11/2018   right    HTN (hypertension)    Hyperlipidemia    Shortness of breath    Sleep apnea    had sx    Umbilical hernia    Social History    Socioeconomic History   Marital status: Married    Spouse name: Not on file   Number of children: Not on file   Years of education: Not on file   Highest education level: Not on file  Occupational History   Not on file  Tobacco Use   Smoking status: Some Days    Packs/day: 1.00    Years: 15.00    Total pack years: 15.00    Types: Cigarettes    Last attempt to quit: 2012    Years since quitting: 11.9   Smokeless tobacco: Never  Vaping Use   Vaping Use: Never used  Substance and Sexual Activity   Alcohol use: Yes    Comment: 2 drinks per month per pt.   Drug use: Not Currently    Types: Marijuana   Sexual activity: Not on file  Other Topics Concern   Not on file  Social History Narrative   Works at [employer]   Financial Resource Strain: Not on file  Food Insecurity: Not on file  Transportation Needs: Not on file  Physical Activity: Not on file  Stress: Not on file  Social Connections: Not on file  Intimate Partner Violence: Not on file   Past Surgical History:  Procedure Laterality Date   TONSILLECTOMY AND ADENOIDECTOMY  2008   UVULOPALATOPHARYNGOPLASTY  2008   Family History  Problem Relation Age of Onset   Healthy Mother    Heart attack Father    Healthy Daughter  Healthy Son    Colon cancer Neg Hx    Esophageal cancer Neg Hx    Rectal cancer Neg Hx    Stomach cancer Neg Hx     Current Outpatient Medications:    buPROPion ER (WELLBUTRIN SR) 100 MG 12 hr tablet, Take 1 tablet (100 mg total) by mouth 2 (two) times daily., Disp: 90 tablet, Rfl: 3   citalopram (CELEXA) 20 MG tablet, Take 20 mg by mouth daily., Disp: , Rfl:    famotidine (PEPCID) 20 MG tablet, Take 1 tablet (20 mg total) by mouth 2 (two) times daily., Disp: 60 tablet, Rfl: 2   febuxostat (ULORIC) 40 MG tablet, TAKE 1 TABLET DAILY, Disp: 90 tablet, Rfl: 3   fluticasone (FLONASE) 50 MCG/ACT nasal spray, USE 2 SPRAYS IN EACH NOSTRIL DAILY, Disp: 48 g,  Rfl: 1   icosapent Ethyl (VASCEPA) 1 g capsule, Take 2 capsules (2 g total) by mouth 2 (two) times daily., Disp: 360 capsule, Rfl: 3   loratadine (CLARITIN) 10 MG tablet, Take 1 tablet (10 mg total) by mouth daily., Disp: 90 tablet, Rfl: 2   LORazepam (ATIVAN) 0.5 MG tablet, , Disp: , Rfl:    metoprolol tartrate (LOPRESSOR) 25 MG tablet, TAKE 1 TABLET TWICE A DAY, Disp: 180 tablet, Rfl: 0   omeprazole (PRILOSEC) 40 MG capsule, TAKE 1 CAPSULE DAILY, Disp: 90 capsule, Rfl: 3   promethazine-dextromethorphan (PROMETHAZINE-DM) 6.25-15 MG/5ML syrup, Take 5 mLs by mouth in the morning and at bedtime., Disp: 240 mL, Rfl: 0   QUEtiapine (SEROQUEL) 50 MG tablet, Take 50 mg by mouth at bedtime., Disp: , Rfl:    rosuvastatin (CRESTOR) 10 MG tablet, Take 1 tablet (10 mg total) by mouth daily., Disp: 90 tablet, Rfl: 3   Semaglutide, 2 MG/DOSE, 8 MG/3ML SOPN, Inject 2 mg as directed once a week., Disp: 9 mL, Rfl: 3   sildenafil (VIAGRA) 100 MG tablet, Take 0.5-1 tablets (50-100 mg total) by mouth daily as needed for erectile dysfunction., Disp: 5 tablet, Rfl: 11   Vitamin D, Ergocalciferol, (DRISDOL) 1.25 MG (50000 UNIT) CAPS capsule, TAKE 1 CAPSULE EVERY 7 DAYS, Disp: 12 capsule, Rfl: 3  Allergies  Allergen Reactions   Depo-Medrol [Methylprednisolone Sodium Succ] Rash     ROS: Review of Systems A comprehensive review of systems was negative except for: Gastrointestinal: positive for scant blood in stool Genitourinary: positive for hesitancy Behavioral/Psych: positive for anxiety and managed by psychiatry    Physical exam BP 129/73   Pulse (!) 101   Temp 98.6 F (37 C)   Ht 5' 10.5" (1.791 m)   Wt 244 lb 3.2 oz (110.8 kg)   SpO2 97%   BMI 34.54 kg/m  General appearance: alert, cooperative, appears stated age, no distress, and moderately obese Head: Normocephalic, without obvious abnormality, atraumatic Eyes: negative findings: lids and lashes normal, conjunctivae and sclerae normal, corneas  clear, and pupils equal, round, reactive to light and accomodation Ears: normal TM's and external ear canals both ears Nose: Nares normal. Septum midline. Mucosa normal. No drainage or sinus tenderness. Throat: lips, mucosa, and tongue normal; teeth and gums normal Neck: no adenopathy, supple, symmetrical, trachea midline, and thyroid not enlarged, symmetric, no tenderness/mass/nodules Back: symmetric, no curvature. ROM normal. No CVA tenderness. Lungs: clear to auscultation bilaterally Chest wall: no tenderness Heart: regular rate and rhythm, S1, S2 normal, no murmur, click, rub or gallop Abdomen:  Protuberant but soft, nontender.  No palpable masses Rectal:  External rectal exam performed.  There were no  appreciable fissures or active bleeding.  Appears to have a small skin tag but no active hemorrhoids Extremities: extremities normal, atraumatic, no cyanosis or edema Pulses: 2+ and symmetric Skin: Skin color, texture, turgor normal. No rashes or lesions Lymph nodes: Cervical, supraclavicular, and axillary nodes normal. Neurologic: Grossly normal      04/11/2022    9:12 AM 01/14/2022    2:24 PM 06/28/2021    8:33 AM  Depression screen PHQ 2/9  Decreased Interest 2 2 1   Down, Depressed, Hopeless 2 1 0  PHQ - 2 Score 4 3 1   Altered sleeping 2 2 2   Tired, decreased energy 3 3 2   Change in appetite 0 2 2  Feeling bad or failure about yourself  0 0 0  Trouble concentrating 2 2 1   Moving slowly or fidgety/restless 0 0 0  Suicidal thoughts 0 0 0  PHQ-9 Score 11 12 8   Difficult doing work/chores Extremely dIfficult Somewhat difficult Somewhat difficult      04/11/2022    9:13 AM 01/14/2022    2:24 PM 06/28/2021    8:34 AM 03/27/2021    2:48 PM  GAD 7 : Generalized Anxiety Score  Nervous, Anxious, on Edge 2 2 1  0  Control/stop worrying 3 3 2 3   Worry too much - different things 3 3 2 3   Trouble relaxing 2 2 2 3   Restless 0 1 0 0  Easily annoyed or irritable 3 2 1 1   Afraid -  awful might happen 2 0 0 0  Total GAD 7 Score 15 13 8 10   Anxiety Difficulty Extremely difficult Somewhat difficult Somewhat difficult Somewhat difficult   Assessment/ Plan: here for annual physical exam.   Annual physical exam  Thoracic aortic atherosclerosis (HCC) - Plan: Lipid Panel, Bayer DCA Hb A1c Waived, rosuvastatin (CRESTOR) 10 MG tablet, CANCELED: Bayer DCA Hb A1c Waived, CANCELED: Lipid Panel  Morbid obesity (HCC) - Plan: Lipid Panel, Bayer DCA Hb A1c Waived, CANCELED: Bayer DCA Hb A1c Waived, CANCELED: Lipid Panel  Coronary artery disease involving native coronary artery of native heart without angina pectoris - Plan: EKG 12-Lead, Lipid Panel, metoprolol tartrate (LOPRESSOR) 25 MG tablet, CANCELED: Lipid Panel  Encounter for medication monitoring - Plan: EKG 12-Lead, Lipid Panel, Bayer DCA Hb A1c Waived, CANCELED: Bayer DCA Hb A1c Waived, CANCELED: Lipid Panel  Low testosterone - Plan: PSA  History of hemorrhoids - Plan: hydrocortisone (PROCTOZONE-HC) 2.5 % rectal cream  Hypertriglyceridemia - Plan: rosuvastatin (CRESTOR) 10 MG tablet  Erectile dysfunction due to diseases classified elsewhere - Plan: sildenafil (VIAGRA) 100 MG tablet  History of vitamin D deficiency - Plan: Vitamin D, Ergocalciferol, (DRISDOL) 1.25 MG (50000 UNIT) CAPS capsule, VITAMIN D 25 Hydroxy (Vit-D Deficiency, Fractures)  Fasting labs collected.  Will CC A1c and lipid panel to Dr. as per their request.  I went ahead and obtain an EKG for them as well given potential for QTc prolongation with his new medication.  I personally reviewed the EKG and this did not demonstrate a prolonged QTc.  Prostate antigen ordered given low testosterone and reports of urinary hesitancy.  He will follow-up with urologist  Recent bleeding hemorrhoid.  Rectal exam unremarkable today.  Prednisone given for as needed use.  Encourage soft stools, high-fiber diet  Metoprolol and Crestor renewed.   Viagra renewed.  Check vitamin D level.  Counseled on healthy lifestyle choices, including diet (rich in fruits, vegetables and lean meats and low in salt and simple carbohydrates) and  exercise (at least 30 minutes of moderate physical activity daily).  Patient to follow up in 1 year for annual exam or sooner if needed.  Tarea Skillman M. Nadine Counts, DO

## 2022-04-11 NOTE — Patient Instructions (Signed)

## 2022-04-12 LAB — LIPID PANEL
Chol/HDL Ratio: 3.9 ratio (ref 0.0–5.0)
Cholesterol, Total: 130 mg/dL (ref 100–199)
HDL: 33 mg/dL — ABNORMAL LOW (ref >39)
LDL Chol Calc (NIH): 50 mg/dL (ref 0–99)
Triglycerides: 307 mg/dL — ABNORMAL HIGH (ref 0–149)
VLDL Cholesterol Cal: 47 mg/dL — ABNORMAL HIGH (ref 5–40)

## 2022-04-12 LAB — VITAMIN D 25 HYDROXY (VIT D DEFICIENCY, FRACTURES): Vit D, 25-Hydroxy: 45.5 ng/mL (ref 30.0–100.0)

## 2022-04-12 LAB — PSA: Prostate Specific Ag, Serum: 0.2 ng/mL (ref 0.0–4.0)

## 2022-04-16 DIAGNOSIS — M9903 Segmental and somatic dysfunction of lumbar region: Secondary | ICD-10-CM | POA: Diagnosis not present

## 2022-04-16 DIAGNOSIS — M9901 Segmental and somatic dysfunction of cervical region: Secondary | ICD-10-CM | POA: Diagnosis not present

## 2022-04-16 DIAGNOSIS — M5136 Other intervertebral disc degeneration, lumbar region: Secondary | ICD-10-CM | POA: Diagnosis not present

## 2022-04-16 DIAGNOSIS — M5033 Other cervical disc degeneration, cervicothoracic region: Secondary | ICD-10-CM | POA: Diagnosis not present

## 2022-04-17 DIAGNOSIS — M9901 Segmental and somatic dysfunction of cervical region: Secondary | ICD-10-CM | POA: Diagnosis not present

## 2022-04-17 DIAGNOSIS — M5033 Other cervical disc degeneration, cervicothoracic region: Secondary | ICD-10-CM | POA: Diagnosis not present

## 2022-04-17 DIAGNOSIS — M9903 Segmental and somatic dysfunction of lumbar region: Secondary | ICD-10-CM | POA: Diagnosis not present

## 2022-04-17 DIAGNOSIS — M5136 Other intervertebral disc degeneration, lumbar region: Secondary | ICD-10-CM | POA: Diagnosis not present

## 2022-04-23 DIAGNOSIS — M9903 Segmental and somatic dysfunction of lumbar region: Secondary | ICD-10-CM | POA: Diagnosis not present

## 2022-04-23 DIAGNOSIS — M5136 Other intervertebral disc degeneration, lumbar region: Secondary | ICD-10-CM | POA: Diagnosis not present

## 2022-04-23 DIAGNOSIS — M9901 Segmental and somatic dysfunction of cervical region: Secondary | ICD-10-CM | POA: Diagnosis not present

## 2022-04-23 DIAGNOSIS — M5033 Other cervical disc degeneration, cervicothoracic region: Secondary | ICD-10-CM | POA: Diagnosis not present

## 2022-04-24 DIAGNOSIS — M5136 Other intervertebral disc degeneration, lumbar region: Secondary | ICD-10-CM | POA: Diagnosis not present

## 2022-04-24 DIAGNOSIS — M9901 Segmental and somatic dysfunction of cervical region: Secondary | ICD-10-CM | POA: Diagnosis not present

## 2022-04-24 DIAGNOSIS — F330 Major depressive disorder, recurrent, mild: Secondary | ICD-10-CM | POA: Diagnosis not present

## 2022-04-24 DIAGNOSIS — F4312 Post-traumatic stress disorder, chronic: Secondary | ICD-10-CM | POA: Diagnosis not present

## 2022-04-24 DIAGNOSIS — M9903 Segmental and somatic dysfunction of lumbar region: Secondary | ICD-10-CM | POA: Diagnosis not present

## 2022-04-24 DIAGNOSIS — M5033 Other cervical disc degeneration, cervicothoracic region: Secondary | ICD-10-CM | POA: Diagnosis not present

## 2022-04-24 DIAGNOSIS — F410 Panic disorder [episodic paroxysmal anxiety] without agoraphobia: Secondary | ICD-10-CM | POA: Diagnosis not present

## 2022-04-28 DIAGNOSIS — M5136 Other intervertebral disc degeneration, lumbar region: Secondary | ICD-10-CM | POA: Diagnosis not present

## 2022-04-28 DIAGNOSIS — M9901 Segmental and somatic dysfunction of cervical region: Secondary | ICD-10-CM | POA: Diagnosis not present

## 2022-04-28 DIAGNOSIS — M5033 Other cervical disc degeneration, cervicothoracic region: Secondary | ICD-10-CM | POA: Diagnosis not present

## 2022-04-28 DIAGNOSIS — M9903 Segmental and somatic dysfunction of lumbar region: Secondary | ICD-10-CM | POA: Diagnosis not present

## 2022-04-29 DIAGNOSIS — F4310 Post-traumatic stress disorder, unspecified: Secondary | ICD-10-CM | POA: Diagnosis not present

## 2022-05-01 DIAGNOSIS — M9903 Segmental and somatic dysfunction of lumbar region: Secondary | ICD-10-CM | POA: Diagnosis not present

## 2022-05-01 DIAGNOSIS — M5136 Other intervertebral disc degeneration, lumbar region: Secondary | ICD-10-CM | POA: Diagnosis not present

## 2022-05-01 DIAGNOSIS — M9901 Segmental and somatic dysfunction of cervical region: Secondary | ICD-10-CM | POA: Diagnosis not present

## 2022-05-01 DIAGNOSIS — M5033 Other cervical disc degeneration, cervicothoracic region: Secondary | ICD-10-CM | POA: Diagnosis not present

## 2022-05-03 ENCOUNTER — Encounter: Payer: Self-pay | Admitting: Family Medicine

## 2022-05-05 DIAGNOSIS — M9903 Segmental and somatic dysfunction of lumbar region: Secondary | ICD-10-CM | POA: Diagnosis not present

## 2022-05-05 DIAGNOSIS — M5033 Other cervical disc degeneration, cervicothoracic region: Secondary | ICD-10-CM | POA: Diagnosis not present

## 2022-05-05 DIAGNOSIS — M5136 Other intervertebral disc degeneration, lumbar region: Secondary | ICD-10-CM | POA: Diagnosis not present

## 2022-05-05 DIAGNOSIS — M9901 Segmental and somatic dysfunction of cervical region: Secondary | ICD-10-CM | POA: Diagnosis not present

## 2022-05-06 ENCOUNTER — Telehealth: Payer: Self-pay

## 2022-05-06 ENCOUNTER — Other Ambulatory Visit: Payer: Self-pay | Admitting: Family Medicine

## 2022-05-06 DIAGNOSIS — E781 Pure hyperglyceridemia: Secondary | ICD-10-CM

## 2022-05-06 DIAGNOSIS — I700 Atherosclerosis of aorta: Secondary | ICD-10-CM

## 2022-05-06 DIAGNOSIS — I2510 Atherosclerotic heart disease of native coronary artery without angina pectoris: Secondary | ICD-10-CM

## 2022-05-06 MED ORDER — WEGOVY 2.4 MG/0.75ML ~~LOC~~ SOAJ: 2.4000 mg | SUBCUTANEOUS | 3 refills | Status: DC

## 2022-05-06 NOTE — Telephone Encounter (Signed)
Scott Rasmussen ([name]) Rx #: [phone] XYBFXO 2.4MG /0.75ML auto-injectors Form [facility] Electronic Prior Authorization Form (2017 NCPDP) Created 42 minutes ago Sent to Plan 6 minutes ago Plan Response 6 minutes ago Submit Clinical Questions less than a minute ago Determination Wait for Determination Please wait for [facility] 2017 NCPDP to return a determination.

## 2022-05-06 NOTE — Telephone Encounter (Signed)
PA Denied   our request for prior authorization was denied, but an Programme researcher, broadcasting/film/video is available for your patient. Complete the questions in the Appeal section at the bottom of this page to pursue the appeal. For assistance, contact our support team at [phone].  Message from plan: Request Reference Number: [id number]. WEGOVY INJ 2.4MG  is denied for not meeting the prior authorization requirement(s). Details of this decision are in the notice attached below or have been faxed to you.

## 2022-05-07 NOTE — Telephone Encounter (Signed)
Glyn Ade ([name]) Rx #: [phone] [id number] DIYMEB 2.4MG /0.75ML auto-injectors Form [facility] Electronic Prior Authorization Form (2017 NCPDP) Created 1 day ago Sent to Plan 1 day ago Plan Response 1 day ago Submit Clinical Questions 1 day ago Determination Unfavorable 1 day ago eAppeal Submitted eAppeal Determination Your prior authorization request has been denied. COMPLETE E-APPEAL Your request for prior authorization was denied, but an Electronic Appeal is available for your patient. Complete the questions in the Appeal section at the bottom of this page to pursue the appeal. For assistance, contact our support team at [phone].  Message from plan: Request Reference Number: [id number]. WEGOVY INJ 2.4MG  is denied for not meeting the prior authorization requirement(s). Details of this decision are in the notice attached below or have been faxed to you.

## 2022-05-08 ENCOUNTER — Other Ambulatory Visit (HOSPITAL_COMMUNITY): Payer: Self-pay

## 2022-05-08 DIAGNOSIS — M5033 Other cervical disc degeneration, cervicothoracic region: Secondary | ICD-10-CM | POA: Diagnosis not present

## 2022-05-08 DIAGNOSIS — M9903 Segmental and somatic dysfunction of lumbar region: Secondary | ICD-10-CM | POA: Diagnosis not present

## 2022-05-08 DIAGNOSIS — M5136 Other intervertebral disc degeneration, lumbar region: Secondary | ICD-10-CM | POA: Diagnosis not present

## 2022-05-08 DIAGNOSIS — M9901 Segmental and somatic dysfunction of cervical region: Secondary | ICD-10-CM | POA: Diagnosis not present

## 2022-05-08 NOTE — Telephone Encounter (Signed)
Received a fax regarding Prior Authorization from [facility] for [facility] 2.4mg /0.19ml.   Authorization has been DENIED because per your health plan criteria, this drug is covered if you meet the following: chart notes dated within the last 30 days showing body fat amount is more or equal to 30kg/m2 or body fat amount more than or equal to 27kg/m2 and if you have another disorder related to weight (ie. High cholesterol, high blood pressure, diabetes, sleep apnea).  Appeal has been submitted [facility]

## 2022-05-08 NOTE — Telephone Encounter (Signed)
Please inform pt

## 2022-05-08 NOTE — Telephone Encounter (Signed)
Patient aware and verbalized understanding.

## 2022-05-09 ENCOUNTER — Encounter: Payer: Self-pay | Admitting: Family Medicine

## 2022-05-12 DIAGNOSIS — M9901 Segmental and somatic dysfunction of cervical region: Secondary | ICD-10-CM | POA: Diagnosis not present

## 2022-05-12 DIAGNOSIS — M9903 Segmental and somatic dysfunction of lumbar region: Secondary | ICD-10-CM | POA: Diagnosis not present

## 2022-05-12 DIAGNOSIS — M5033 Other cervical disc degeneration, cervicothoracic region: Secondary | ICD-10-CM | POA: Diagnosis not present

## 2022-05-12 DIAGNOSIS — M5136 Other intervertebral disc degeneration, lumbar region: Secondary | ICD-10-CM | POA: Diagnosis not present

## 2022-05-15 DIAGNOSIS — M5136 Other intervertebral disc degeneration, lumbar region: Secondary | ICD-10-CM | POA: Diagnosis not present

## 2022-05-15 DIAGNOSIS — M9903 Segmental and somatic dysfunction of lumbar region: Secondary | ICD-10-CM | POA: Diagnosis not present

## 2022-05-15 DIAGNOSIS — M9901 Segmental and somatic dysfunction of cervical region: Secondary | ICD-10-CM | POA: Diagnosis not present

## 2022-05-15 DIAGNOSIS — M5033 Other cervical disc degeneration, cervicothoracic region: Secondary | ICD-10-CM | POA: Diagnosis not present

## 2022-05-19 ENCOUNTER — Encounter (HOSPITAL_BASED_OUTPATIENT_CLINIC_OR_DEPARTMENT_OTHER): Payer: Self-pay | Admitting: Emergency Medicine

## 2022-05-19 ENCOUNTER — Other Ambulatory Visit: Payer: Self-pay

## 2022-05-19 ENCOUNTER — Emergency Department (HOSPITAL_BASED_OUTPATIENT_CLINIC_OR_DEPARTMENT_OTHER): Payer: BC Managed Care – PPO | Admitting: Radiology

## 2022-05-19 ENCOUNTER — Emergency Department (HOSPITAL_BASED_OUTPATIENT_CLINIC_OR_DEPARTMENT_OTHER)
Admission: EM | Admit: 2022-05-19 | Discharge: 2022-05-19 | Disposition: A | Discharge status: Home / Self Care | Payer: BC Managed Care – PPO | Attending: Emergency Medicine | Admitting: Emergency Medicine

## 2022-05-19 DIAGNOSIS — M9903 Segmental and somatic dysfunction of lumbar region: Secondary | ICD-10-CM | POA: Diagnosis not present

## 2022-05-19 DIAGNOSIS — F410 Panic disorder [episodic paroxysmal anxiety] without agoraphobia: Secondary | ICD-10-CM | POA: Diagnosis not present

## 2022-05-19 DIAGNOSIS — M5136 Other intervertebral disc degeneration, lumbar region: Secondary | ICD-10-CM | POA: Diagnosis not present

## 2022-05-19 DIAGNOSIS — Z79899 Other long term (current) drug therapy: Secondary | ICD-10-CM | POA: Diagnosis not present

## 2022-05-19 DIAGNOSIS — Z87891 Personal history of nicotine dependence: Secondary | ICD-10-CM | POA: Insufficient documentation

## 2022-05-19 DIAGNOSIS — R079 Chest pain, unspecified: Secondary | ICD-10-CM | POA: Diagnosis not present

## 2022-05-19 DIAGNOSIS — R0789 Other chest pain: Secondary | ICD-10-CM | POA: Diagnosis not present

## 2022-05-19 DIAGNOSIS — M5033 Other cervical disc degeneration, cervicothoracic region: Secondary | ICD-10-CM | POA: Diagnosis not present

## 2022-05-19 DIAGNOSIS — I10 Essential (primary) hypertension: Secondary | ICD-10-CM | POA: Diagnosis not present

## 2022-05-19 DIAGNOSIS — R0602 Shortness of breath: Secondary | ICD-10-CM | POA: Diagnosis not present

## 2022-05-19 DIAGNOSIS — M9901 Segmental and somatic dysfunction of cervical region: Secondary | ICD-10-CM | POA: Diagnosis not present

## 2022-05-19 DIAGNOSIS — F4312 Post-traumatic stress disorder, chronic: Secondary | ICD-10-CM | POA: Diagnosis not present

## 2022-05-19 DIAGNOSIS — F330 Major depressive disorder, recurrent, mild: Secondary | ICD-10-CM | POA: Diagnosis not present

## 2022-05-19 LAB — HEPATIC FUNCTION PANEL
ALT: 35 U/L (ref 0–44)
AST: 22 U/L (ref 15–41)
Albumin: 4.8 g/dL (ref 3.5–5.0)
Alkaline Phosphatase: 51 U/L (ref 38–126)
Bilirubin, Direct: 0.1 mg/dL (ref 0.0–0.2)
Indirect Bilirubin: 0.5 mg/dL (ref 0.3–0.9)
Total Bilirubin: 0.6 mg/dL (ref 0.3–1.2)
Total Protein: 7.4 g/dL (ref 6.5–8.1)

## 2022-05-19 LAB — BASIC METABOLIC PANEL
Anion gap: 10 (ref 5–15)
BUN: 13 mg/dL (ref 6–20)
CO2: 27 mmol/L (ref 22–32)
Calcium: 10.4 mg/dL — ABNORMAL HIGH (ref 8.9–10.3)
Chloride: 103 mmol/L (ref 98–111)
Creatinine, Ser: 0.96 mg/dL (ref 0.61–1.24)
GFR, Estimated: >60 mL/min (ref >60)
Glucose, Bld: 82 mg/dL (ref 70–99)
Potassium: 4.1 mmol/L (ref 3.5–5.1)
Sodium: 140 mmol/L (ref 135–145)

## 2022-05-19 LAB — CBC
HCT: 51.5 % (ref 39.0–52.0)
Hemoglobin: 17.7 g/dL — ABNORMAL HIGH (ref 13.0–17.0)
MCH: 29.5 pg (ref 26.0–34.0)
MCHC: 34.4 g/dL (ref 30.0–36.0)
MCV: 85.7 fL (ref 80.0–100.0)
Platelets: 282 10*3/uL (ref 150–400)
RBC: 6.01 MIL/uL — ABNORMAL HIGH (ref 4.22–5.81)
RDW: 12.7 % (ref 11.5–15.5)
WBC: 7.4 10*3/uL (ref 4.0–10.5)
nRBC: 0 % (ref 0.0–0.2)

## 2022-05-19 LAB — LIPASE, BLOOD: Lipase: 63 U/L — ABNORMAL HIGH (ref 11–51)

## 2022-05-19 LAB — TROPONIN I (HIGH SENSITIVITY)
Troponin I (High Sensitivity): 2 ng/L (ref <18)
Troponin I (High Sensitivity): 2 ng/L (ref <18)

## 2022-05-19 MED ORDER — NITROGLYCERIN 0.4 MG SL SUBL
0.4000 mg | SUBLINGUAL_TABLET | Freq: Once | SUBLINGUAL | Status: AC
  Administered 2022-05-19: 0.4 mg via SUBLINGUAL
  Filled 2022-05-19: qty 1

## 2022-05-19 NOTE — ED Provider Notes (Signed)
[place] EMERGENCY DEPARTMENT AT [facility] Provider Note   CSN: [id number] Arrival date & time: 05/19/22  1827     History  Chief Complaint  Patient presents with   Chest Pain    Scott Rasmussen is a 57 y.o. male.  Patient here with chest pain since this morning.  Nothing makes it worse or better.  History of anxiety.  He feels anxious.  History of high cholesterol hypertension acid reflux.  Cardiac history in the family.  Denies any recent surgery or travel.  No history of blood clots.  He is not feeling short of breath.  No nausea or vomiting diarrhea.  The history is provided by the patient.       Home Medications Prior to Admission medications   Medication Sig Start Date End Date Taking? Authorizing Provider  buPROPion ER (WELLBUTRIN SR) 100 MG 12 hr tablet Take 1 tablet (100 mg total) by mouth 2 (two) times daily. 04/11/22   Scott Ip, DO  citalopram (CELEXA) 20 MG tablet Take 20 mg by mouth daily.    [provider]  famotidine (PEPCID) 20 MG tablet Take 1 tablet (20 mg total) by mouth 2 (two) times daily. 09/09/21   Scott Ip, DO  febuxostat (ULORIC) 40 MG tablet Take 1 tablet (40 mg total) by mouth daily. 04/11/22   Scott Ip, DO  fluticasone (FLONASE) 50 MCG/ACT nasal spray Place 2 sprays into both nostrils daily. 04/11/22   Scott Ip, DO  hydrocortisone (PROCTOZONE-HC) 2.5 % rectal cream Place 1 Application rectally 2 (two) times daily as needed for hemorrhoids or anal itching. 04/11/22   Scott Ip, DO  icosapent Ethyl (VASCEPA) 1 g capsule Take 2 capsules (2 g total) by mouth 2 (two) times daily. 09/17/21   Scott Ramp, MD  loratadine (CLARITIN) 10 MG tablet Take 1 tablet (10 mg total) by mouth daily. 04/11/22   Scott Ip, DO  LORazepam (ATIVAN) 0.5 MG tablet     [provider]  metoprolol tartrate (LOPRESSOR) 25 MG tablet Take 1 tablet (25 mg total) by mouth 2 (two)  times daily. 04/11/22   Scott Ip, DO  omeprazole (PRILOSEC) 40 MG capsule Take 1 capsule (40 mg total) by mouth daily. 04/11/22   Scott Ip, DO  QUEtiapine (SEROQUEL) 50 MG tablet Take 50 mg by mouth at bedtime.    [provider]  rosuvastatin (CRESTOR) 10 MG tablet Take 1 tablet (10 mg total) by mouth daily. 04/11/22   Scott Ip, DO  Semaglutide-Weight Management (WEGOVY) 2.4 MG/0.75ML SOAJ Inject 2.4 mg into the skin every 7 (seven) days. 05/06/22   Scott Ip, DO  sildenafil (VIAGRA) 100 MG tablet Take 0.5-1 tablets (50-100 mg total) by mouth daily as needed for erectile dysfunction. 04/11/22   Scott Ip, DO  Vitamin D, Ergocalciferol, (DRISDOL) 1.25 MG (50000 UNIT) CAPS capsule TAKE 1 CAPSULE EVERY 7 DAYS 04/11/22   Scott Flavin M, DO      Allergies    Depo-medrol [methylprednisolone sodium succ]    Review of Systems   Review of Systems  Physical Exam Updated Vital Signs BP 128/79 (BP Location: Left Arm)   Pulse 72   Temp 98 F (36.7 C) (Oral)   Resp 18   SpO2 100%  Physical Exam Vitals and nursing note reviewed.  Constitutional:      General: He is not in acute distress.    Appearance: He is well-developed. He is  not ill-appearing.  HENT:     Head: Normocephalic and atraumatic.  Eyes:     Extraocular Movements: Extraocular movements intact.     Conjunctiva/sclera: Conjunctivae normal.     Pupils: Pupils are equal, round, and reactive to light.  Cardiovascular:     Rate and Rhythm: Normal rate and regular rhythm.     Pulses:          Radial pulses are 2+ on the right side and 2+ on the left side.     Heart sounds: Normal heart sounds. No murmur heard. Pulmonary:     Effort: Pulmonary effort is normal. No respiratory distress.     Breath sounds: Normal breath sounds. No decreased breath sounds, wheezing or rhonchi.  Abdominal:     Palpations: Abdomen is soft.     Tenderness: There is no abdominal  tenderness.  Musculoskeletal:        General: No swelling.     Cervical back: Normal range of motion and neck supple.  Skin:    General: Skin is warm and dry.     Capillary Refill: Capillary refill takes less than 2 seconds.  Neurological:     General: No focal deficit present.     Mental Status: He is alert.  Psychiatric:        Mood and Affect: Mood is anxious.     ED Results / Procedures / Treatments   Labs (all labs ordered are listed, but only abnormal results are displayed) Labs Reviewed  BASIC METABOLIC PANEL - Abnormal; Notable for the following components:      Result Value   Calcium 10.4 (*)    All other components within normal limits  CBC - Abnormal; Notable for the following components:   RBC 6.01 (*)    Hemoglobin 17.7 (*)    All other components within normal limits  LIPASE, BLOOD - Abnormal; Notable for the following components:   Lipase 63 (*)    All other components within normal limits  HEPATIC FUNCTION PANEL  TROPONIN I (HIGH SENSITIVITY)  TROPONIN I (HIGH SENSITIVITY)    EKG EKG Interpretation  Date/Time:  Monday May 19 2022 18:34:01 EST Ventricular Rate:  85 PR Interval:  114 QRS Duration: 90 QT Interval:  332 QTC Calculation: 395 R Axis:   53 Text Interpretation: Normal sinus rhythm Normal ECG No previous ECGs available Confirmed by Scott Rasmussen ([id number]) on 05/19/2022 6:38:59 PM  Radiology DG Chest 2 View  Result Date: 05/19/2022 CLINICAL DATA:  Chest pain and chest tightness with mild shortness of breath EXAM: CHEST - 2 VIEW COMPARISON:  03/27/2021 FINDINGS: The heart size and mediastinal contours are within normal limits. Both lungs are clear. The visualized skeletal structures are unremarkable. IMPRESSION: No active cardiopulmonary disease. Electronically Signed   By: Scott Rasmussen Rasmussen.D.   On: 05/19/2022 19:07    Procedures Procedures    Medications Ordered in ED Medications  nitroGLYCERIN (NITROSTAT) SL tablet 0.4 mg (0.4 mg  Sublingual Given 05/19/22 1932)    ED Course/ Medical Decision Making/ A&P             HEART Score: 2                Medical Decision Making Amount and/or Complexity of Data Reviewed Labs: ordered. Radiology: ordered.  Risk Prescription drug management.   Scott Rasmussen is here with chest pain.  Pain since 6 AM.  He is feeling anxious.  History of high cholesterol hypertension.  Cardiac history in the  family.  Former smoker.  Denies any shortness of breath, recent surgery or travel.  He is Wells criteria 0 and doubt PE.  EKG done in triage per my review interpretation shows sinus rhythm.  No ischemic changes.  Differential diagnosis is possibly ACS but seems more likely to be GI or anxiety related.  Will check CBC, BMP, troponin, chest x-ray.  Patient was given a dose of nitroglycerin without much change.  He is feeling better after our discussion.  Denies any recent illness.  Unlikely to be pneumonia.  Per my review interpretation of labs there is no significant anemia electrolyte abnormality or kidney injury.  Troponin negative x 2.  Heart score is 2.  Doubt ACS.  However will have him follow-up with cardiology outpatient.  Chest x-ray showed no evidence of pneumonia or pneumothorax.  Overall recommend rest hydration.  Understands return precautions.  Discharged in good condition.  This chart was dictated using voice recognition software.  Despite best efforts to proofread,  errors can occur which can change the documentation meaning.         Final Clinical Impression(s) / ED Diagnoses Final diagnoses:  Atypical chest pain    Rx / DC Orders ED Discharge Orders          Ordered    Ambulatory referral to Cardiology       Comments: If you have not heard from the Cardiology office within the next 72 hours please call [phone].   05/19/22 [place], [place], DO 05/19/22 2213

## 2022-05-22 DIAGNOSIS — M5136 Other intervertebral disc degeneration, lumbar region: Secondary | ICD-10-CM | POA: Diagnosis not present

## 2022-05-22 DIAGNOSIS — M5033 Other cervical disc degeneration, cervicothoracic region: Secondary | ICD-10-CM | POA: Diagnosis not present

## 2022-05-22 DIAGNOSIS — M9901 Segmental and somatic dysfunction of cervical region: Secondary | ICD-10-CM | POA: Diagnosis not present

## 2022-05-22 DIAGNOSIS — M9903 Segmental and somatic dysfunction of lumbar region: Secondary | ICD-10-CM | POA: Diagnosis not present

## 2022-05-26 DIAGNOSIS — M5136 Other intervertebral disc degeneration, lumbar region: Secondary | ICD-10-CM | POA: Diagnosis not present

## 2022-05-26 DIAGNOSIS — M9901 Segmental and somatic dysfunction of cervical region: Secondary | ICD-10-CM | POA: Diagnosis not present

## 2022-05-26 DIAGNOSIS — M5033 Other cervical disc degeneration, cervicothoracic region: Secondary | ICD-10-CM | POA: Diagnosis not present

## 2022-05-26 DIAGNOSIS — M9903 Segmental and somatic dysfunction of lumbar region: Secondary | ICD-10-CM | POA: Diagnosis not present

## 2022-05-28 DIAGNOSIS — M9903 Segmental and somatic dysfunction of lumbar region: Secondary | ICD-10-CM | POA: Diagnosis not present

## 2022-05-28 DIAGNOSIS — F4310 Post-traumatic stress disorder, unspecified: Secondary | ICD-10-CM | POA: Diagnosis not present

## 2022-05-28 DIAGNOSIS — M5136 Other intervertebral disc degeneration, lumbar region: Secondary | ICD-10-CM | POA: Diagnosis not present

## 2022-05-28 DIAGNOSIS — M9901 Segmental and somatic dysfunction of cervical region: Secondary | ICD-10-CM | POA: Diagnosis not present

## 2022-05-28 DIAGNOSIS — M5033 Other cervical disc degeneration, cervicothoracic region: Secondary | ICD-10-CM | POA: Diagnosis not present

## 2022-05-29 ENCOUNTER — Telehealth (INDEPENDENT_AMBULATORY_CARE_PROVIDER_SITE_OTHER): Payer: BC Managed Care – PPO | Admitting: Family Medicine

## 2022-05-29 ENCOUNTER — Encounter: Payer: Self-pay | Admitting: Family Medicine

## 2022-05-29 DIAGNOSIS — J0100 Acute maxillary sinusitis, unspecified: Secondary | ICD-10-CM

## 2022-05-29 IMAGING — DX DG CHEST 2V
2 series · 2 of 2 positions shown · non-contrast
Comparison: 03/02/2020

CLINICAL DATA: Cough

EXAM:
CHEST - 2 VIEW

[chest pa]
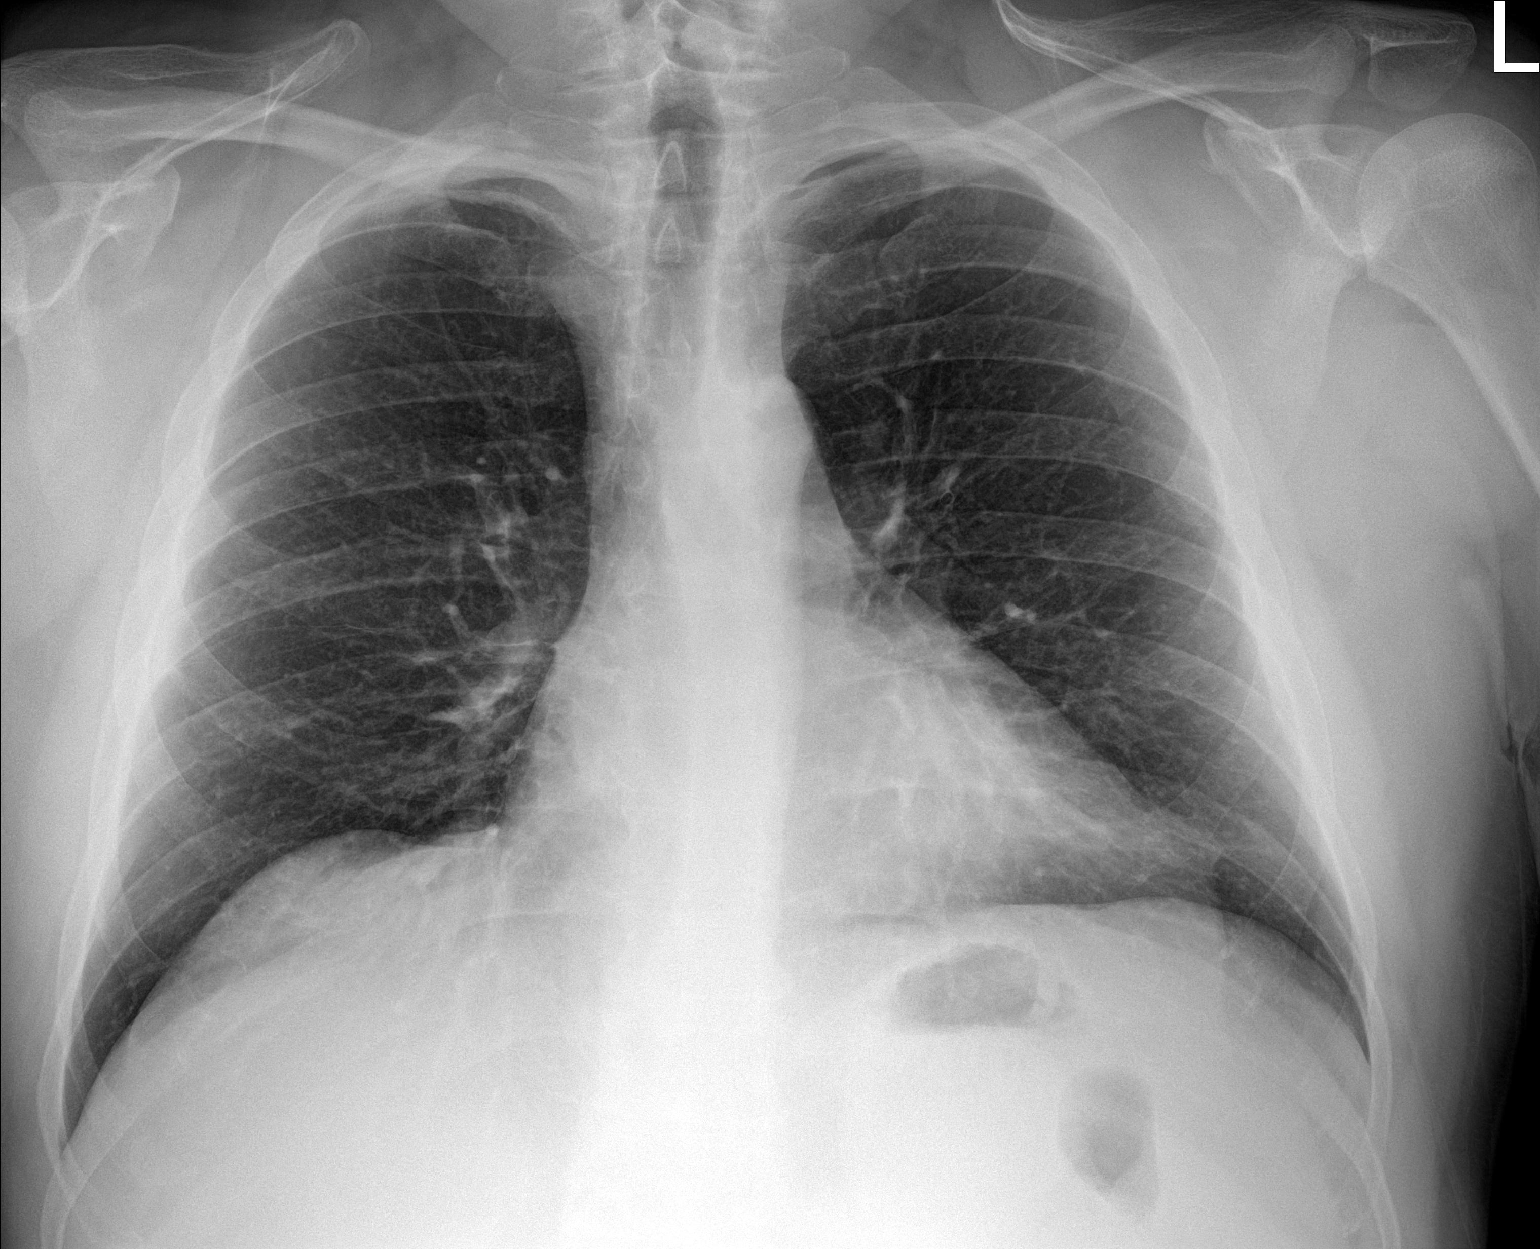

[chest lat]
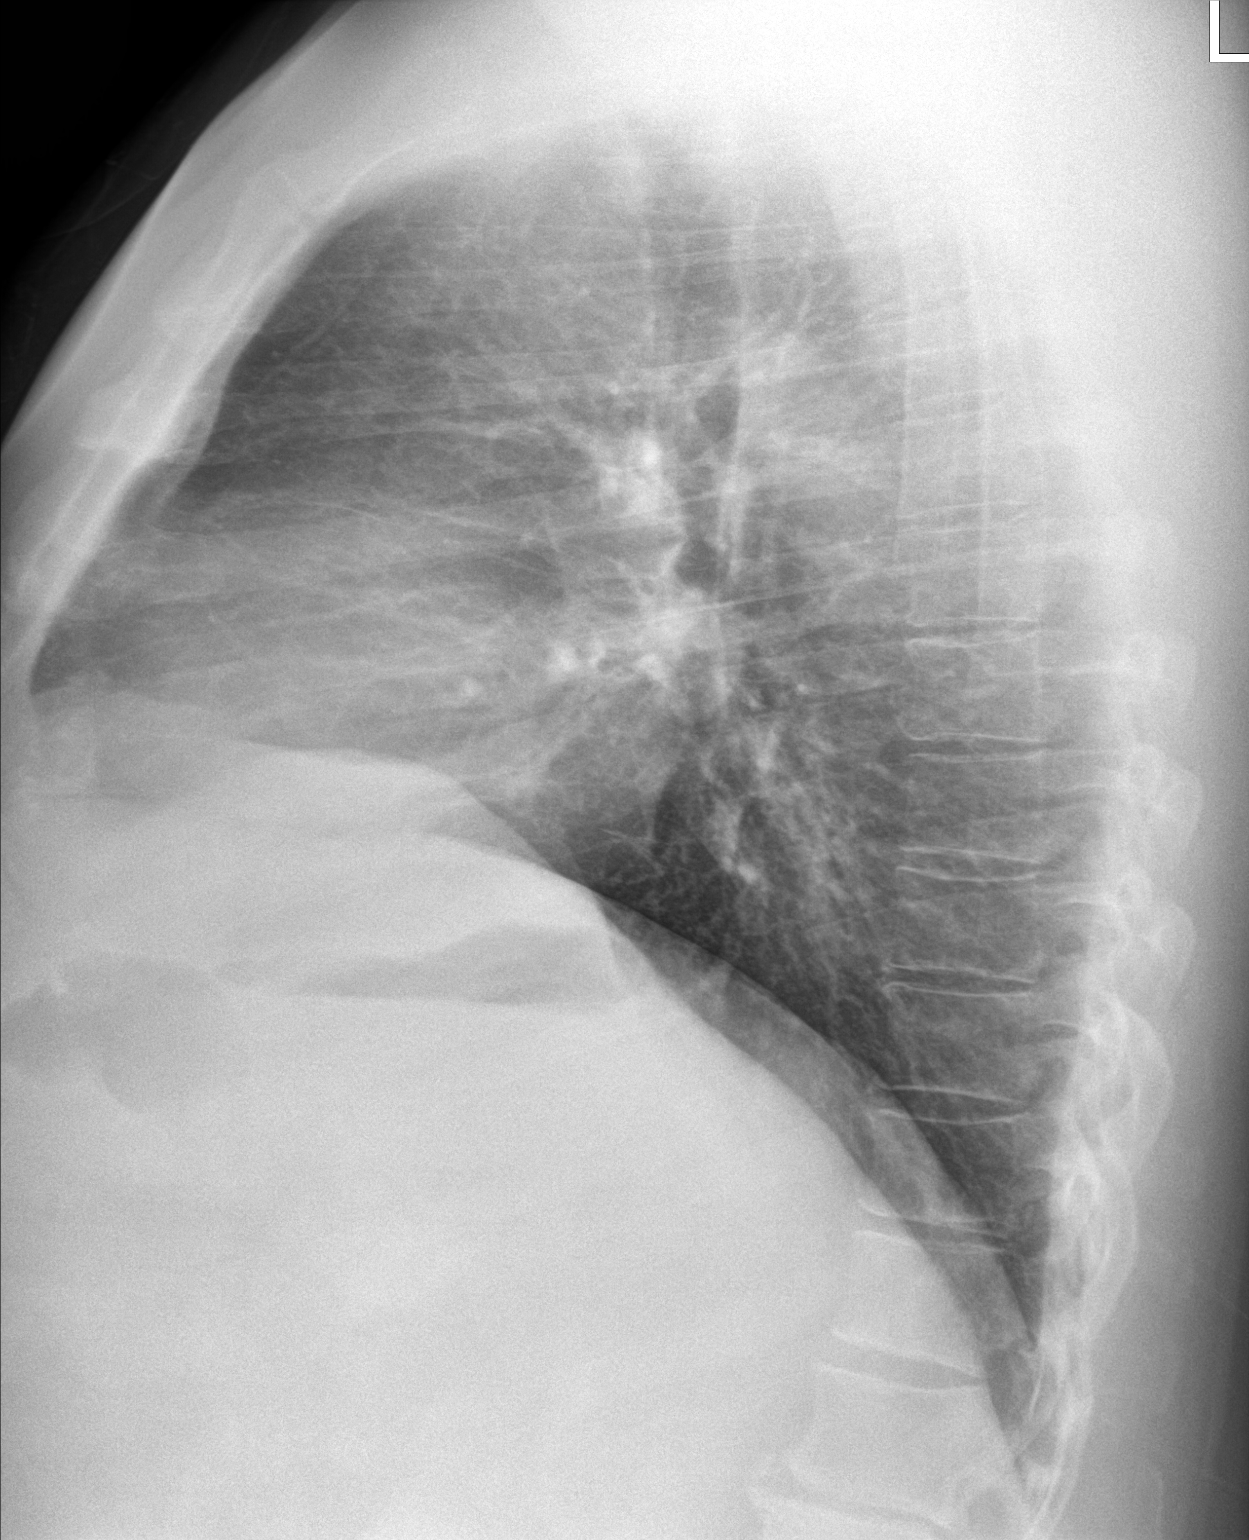

[2 of 2 positions shown; findings below may reference images not displayed]

FINDINGS: The heart size and mediastinal contours are within normal limits.
Both lungs are clear. The visualized skeletal structures are
unremarkable.
IMPRESSION: No active cardiopulmonary disease.

## 2022-05-29 MED ORDER — AMOXICILLIN 500 MG PO CAPS: 500.0000 mg | ORAL_CAPSULE | Freq: Two times a day (BID) | ORAL | 0 refills | Status: DC

## 2022-05-29 NOTE — Progress Notes (Signed)
Virtual Visit via mychart video Note  I connected with Scott Rasmussen on 05/29/22 at 1510 by video and verified that I am speaking with the correct person using two identifiers. Scott Rasmussen is currently located at home and patient are currently with her during visit. The provider, Fransisca Kaufmann Hortense Cantrall, MD is located in their office at time of visit.  Call ended at 1517  I discussed the limitations, risks, security and privacy concerns of performing an evaluation and management service by video and the availability of in person appointments. I also discussed with the patient that there may be a patient responsible charge related to this service. The patient expressed understanding and agreed to proceed.   History and Present Illness: Patient is calling in for sinus pressure for 5 days. He is using mucinex.  He also has upset stomach.  He denies fevers, chills, or body aches. He is having a lot drainage. He is not coughing. He denies sick contacts that he knows of. He is not having shortness of breath or wheezing.   1. Acute non-recurrent maxillary sinusitis     Outpatient Encounter Medications as of 05/29/2022  Medication Sig   amoxicillin (AMOXIL) 500 MG capsule Take 1 capsule (500 mg total) by mouth 2 (two) times daily.   buPROPion ER (WELLBUTRIN SR) 100 MG 12 hr tablet Take 1 tablet (100 mg total) by mouth 2 (two) times daily.   citalopram (CELEXA) 20 MG tablet Take 20 mg by mouth daily.   famotidine (PEPCID) 20 MG tablet Take 1 tablet (20 mg total) by mouth 2 (two) times daily.   febuxostat (ULORIC) 40 MG tablet Take 1 tablet (40 mg total) by mouth daily.   fluticasone (FLONASE) 50 MCG/ACT nasal spray Place 2 sprays into both nostrils daily.   hydrocortisone (PROCTOZONE-HC) 2.5 % rectal cream Place 1 Application rectally 2 (two) times daily as needed for hemorrhoids or anal itching.   icosapent Ethyl (VASCEPA) 1 g capsule Take 2 capsules (2 g total) by mouth 2 (two) times daily.    loratadine (CLARITIN) 10 MG tablet Take 1 tablet (10 mg total) by mouth daily.   LORazepam (ATIVAN) 0.5 MG tablet    metoprolol tartrate (LOPRESSOR) 25 MG tablet Take 1 tablet (25 mg total) by mouth 2 (two) times daily.   omeprazole (PRILOSEC) 40 MG capsule Take 1 capsule (40 mg total) by mouth daily.   QUEtiapine (SEROQUEL) 50 MG tablet Take 50 mg by mouth at bedtime.   rosuvastatin (CRESTOR) 10 MG tablet Take 1 tablet (10 mg total) by mouth daily.   Semaglutide-Weight Management (WEGOVY) 2.4 MG/0.75ML SOAJ Inject 2.4 mg into the skin every 7 (seven) days.   sildenafil (VIAGRA) 100 MG tablet Take 0.5-1 tablets (50-100 mg total) by mouth daily as needed for erectile dysfunction.   Vitamin D, Ergocalciferol, (DRISDOL) 1.25 MG (50000 UNIT) CAPS capsule TAKE 1 CAPSULE EVERY 7 DAYS   No facility-administered encounter medications on file as of 05/29/2022.    Review of Systems  Constitutional:  Positive for fever. Negative for chills.  HENT:  Positive for congestion, postnasal drip, rhinorrhea, sinus pressure, sneezing and sore throat. Negative for ear discharge, ear pain and voice change.   Eyes:  Negative for pain, discharge, redness and visual disturbance.  Respiratory:  Positive for cough. Negative for shortness of breath and wheezing.   Cardiovascular:  Negative for chest pain and leg swelling.  Musculoskeletal:  Negative for gait problem.  Skin:  Negative for rash.  All other  systems reviewed and are negative.   Observations/Objective: Patient sounds comfortable and in no acute distress  Assessment and Plan: Problem List Items Addressed This Visit       Respiratory   Acute maxillary sinusitis - Primary   Relevant Medications   amoxicillin (AMOXIL) 500 MG capsule   Recommended flonase and mucinex and amoxicillin and nasal irrigation.  Follow up plan: Return if symptoms worsen or fail to improve.     I discussed the assessment and treatment plan with the patient. The patient  was provided an opportunity to ask questions and all were answered. The patient agreed with the plan and demonstrated an understanding of the instructions.   The patient was advised to call back or seek an in-person evaluation if the symptoms worsen or if the condition fails to improve as anticipated.  The above assessment and management plan was discussed with the patient. The patient verbalized understanding of and has agreed to the management plan. Patient is aware to call the clinic if symptoms persist or worsen. Patient is aware when to return to the clinic for a follow-up visit. Patient educated on when it is appropriate to go to the emergency department.    I provided 7 minutes of non-face-to-face time during this encounter.    Worthy Rancher, MD

## 2022-06-04 DIAGNOSIS — M9901 Segmental and somatic dysfunction of cervical region: Secondary | ICD-10-CM | POA: Diagnosis not present

## 2022-06-04 DIAGNOSIS — M9903 Segmental and somatic dysfunction of lumbar region: Secondary | ICD-10-CM | POA: Diagnosis not present

## 2022-06-04 DIAGNOSIS — M5136 Other intervertebral disc degeneration, lumbar region: Secondary | ICD-10-CM | POA: Diagnosis not present

## 2022-06-04 DIAGNOSIS — M5033 Other cervical disc degeneration, cervicothoracic region: Secondary | ICD-10-CM | POA: Diagnosis not present

## 2022-06-05 DIAGNOSIS — M9903 Segmental and somatic dysfunction of lumbar region: Secondary | ICD-10-CM | POA: Diagnosis not present

## 2022-06-05 DIAGNOSIS — M5136 Other intervertebral disc degeneration, lumbar region: Secondary | ICD-10-CM | POA: Diagnosis not present

## 2022-06-05 DIAGNOSIS — M9901 Segmental and somatic dysfunction of cervical region: Secondary | ICD-10-CM | POA: Diagnosis not present

## 2022-06-05 DIAGNOSIS — M5033 Other cervical disc degeneration, cervicothoracic region: Secondary | ICD-10-CM | POA: Diagnosis not present

## 2022-06-09 DIAGNOSIS — M9901 Segmental and somatic dysfunction of cervical region: Secondary | ICD-10-CM | POA: Diagnosis not present

## 2022-06-09 DIAGNOSIS — M5136 Other intervertebral disc degeneration, lumbar region: Secondary | ICD-10-CM | POA: Diagnosis not present

## 2022-06-09 DIAGNOSIS — M5033 Other cervical disc degeneration, cervicothoracic region: Secondary | ICD-10-CM | POA: Diagnosis not present

## 2022-06-09 DIAGNOSIS — M9903 Segmental and somatic dysfunction of lumbar region: Secondary | ICD-10-CM | POA: Diagnosis not present

## 2022-06-11 DIAGNOSIS — M9903 Segmental and somatic dysfunction of lumbar region: Secondary | ICD-10-CM | POA: Diagnosis not present

## 2022-06-11 DIAGNOSIS — M5136 Other intervertebral disc degeneration, lumbar region: Secondary | ICD-10-CM | POA: Diagnosis not present

## 2022-06-11 DIAGNOSIS — M5033 Other cervical disc degeneration, cervicothoracic region: Secondary | ICD-10-CM | POA: Diagnosis not present

## 2022-06-11 DIAGNOSIS — M9901 Segmental and somatic dysfunction of cervical region: Secondary | ICD-10-CM | POA: Diagnosis not present

## 2022-06-17 ENCOUNTER — Telehealth: Payer: Self-pay | Admitting: *Deleted

## 2022-06-17 DIAGNOSIS — I700 Atherosclerosis of aorta: Secondary | ICD-10-CM

## 2022-06-17 DIAGNOSIS — M9903 Segmental and somatic dysfunction of lumbar region: Secondary | ICD-10-CM | POA: Diagnosis not present

## 2022-06-17 DIAGNOSIS — E781 Pure hyperglyceridemia: Secondary | ICD-10-CM

## 2022-06-17 DIAGNOSIS — N521 Erectile dysfunction due to diseases classified elsewhere: Secondary | ICD-10-CM

## 2022-06-17 DIAGNOSIS — M5136 Other intervertebral disc degeneration, lumbar region: Secondary | ICD-10-CM | POA: Diagnosis not present

## 2022-06-17 DIAGNOSIS — Z8639 Personal history of other endocrine, nutritional and metabolic disease: Secondary | ICD-10-CM

## 2022-06-17 DIAGNOSIS — M5033 Other cervical disc degeneration, cervicothoracic region: Secondary | ICD-10-CM | POA: Diagnosis not present

## 2022-06-17 DIAGNOSIS — M9901 Segmental and somatic dysfunction of cervical region: Secondary | ICD-10-CM | POA: Diagnosis not present

## 2022-06-17 DIAGNOSIS — Z8719 Personal history of other diseases of the digestive system: Secondary | ICD-10-CM

## 2022-06-17 DIAGNOSIS — I2510 Atherosclerotic heart disease of native coronary artery without angina pectoris: Secondary | ICD-10-CM

## 2022-06-17 MED ORDER — HYDROCORTISONE (PERIANAL) 2.5 % EX CREA: 1.0000 | TOPICAL_CREAM | Freq: Two times a day (BID) | CUTANEOUS | 0 refills | Status: DC | PRN

## 2022-06-17 MED ORDER — VITAMIN D (ERGOCALCIFEROL) 1.25 MG (50000 UNIT) PO CAPS: ORAL_CAPSULE | ORAL | 3 refills | Status: DC

## 2022-06-17 MED ORDER — METOPROLOL TARTRATE 25 MG PO TABS: 25.0000 mg | ORAL_TABLET | Freq: Two times a day (BID) | ORAL | 3 refills | Status: DC

## 2022-06-17 MED ORDER — ROSUVASTATIN CALCIUM 10 MG PO TABS: 10.0000 mg | ORAL_TABLET | Freq: Every day | ORAL | 3 refills | Status: DC

## 2022-06-17 MED ORDER — SILDENAFIL CITRATE 100 MG PO TABS: 50.0000 mg | ORAL_TABLET | Freq: Every day | ORAL | 11 refills | Status: DC | PRN

## 2022-06-17 MED ORDER — FEBUXOSTAT 40 MG PO TABS: 40.0000 mg | ORAL_TABLET | Freq: Every day | ORAL | 3 refills | Status: DC

## 2022-06-17 MED ORDER — OMEPRAZOLE 40 MG PO CPDR: 40.0000 mg | DELAYED_RELEASE_CAPSULE | Freq: Every day | ORAL | 3 refills | Status: DC

## 2022-06-17 MED ORDER — FLUTICASONE PROPIONATE 50 MCG/ACT NA SUSP: 2.0000 | Freq: Every day | NASAL | 1 refills | Status: DC

## 2022-06-17 MED ORDER — BUPROPION HCL ER (SR) 100 MG PO TB12: 100.0000 mg | ORAL_TABLET | Freq: Two times a day (BID) | ORAL | 3 refills | Status: DC

## 2022-06-17 MED ORDER — FLUTICASONE PROPIONATE 50 MCG/ACT NA SUSP: 2.0000 | Freq: Every day | NASAL | 1 refills | Status: AC

## 2022-06-17 MED ORDER — LORATADINE 10 MG PO TABS: 10.0000 mg | ORAL_TABLET | Freq: Every day | ORAL | 3 refills | Status: DC

## 2022-06-17 NOTE — Telephone Encounter (Signed)
Fax from [facility] to verify if all Rxs that they received by fax were from our office & intended for [facility] since they did not have a cover letter or a return fax# on them. There is no copy of the printed scripts in media, all scripts were printed, will send all scripts electronically.

## 2022-06-17 NOTE — Addendum Note (Signed)
Addended by: Antonietta Barcelona D on: 06/17/2022 11:06 AM   Modules accepted: Orders

## 2022-06-19 DIAGNOSIS — M9903 Segmental and somatic dysfunction of lumbar region: Secondary | ICD-10-CM | POA: Diagnosis not present

## 2022-06-19 DIAGNOSIS — M5033 Other cervical disc degeneration, cervicothoracic region: Secondary | ICD-10-CM | POA: Diagnosis not present

## 2022-06-19 DIAGNOSIS — M9901 Segmental and somatic dysfunction of cervical region: Secondary | ICD-10-CM | POA: Diagnosis not present

## 2022-06-19 DIAGNOSIS — M5136 Other intervertebral disc degeneration, lumbar region: Secondary | ICD-10-CM | POA: Diagnosis not present

## 2022-06-24 DIAGNOSIS — M1611 Unilateral primary osteoarthritis, right hip: Secondary | ICD-10-CM | POA: Diagnosis not present

## 2022-06-25 DIAGNOSIS — M5136 Other intervertebral disc degeneration, lumbar region: Secondary | ICD-10-CM | POA: Diagnosis not present

## 2022-06-25 DIAGNOSIS — M5033 Other cervical disc degeneration, cervicothoracic region: Secondary | ICD-10-CM | POA: Diagnosis not present

## 2022-06-25 DIAGNOSIS — M9901 Segmental and somatic dysfunction of cervical region: Secondary | ICD-10-CM | POA: Diagnosis not present

## 2022-06-25 DIAGNOSIS — M9903 Segmental and somatic dysfunction of lumbar region: Secondary | ICD-10-CM | POA: Diagnosis not present

## 2022-06-26 DIAGNOSIS — M9903 Segmental and somatic dysfunction of lumbar region: Secondary | ICD-10-CM | POA: Diagnosis not present

## 2022-06-26 DIAGNOSIS — M9901 Segmental and somatic dysfunction of cervical region: Secondary | ICD-10-CM | POA: Diagnosis not present

## 2022-06-26 DIAGNOSIS — M5033 Other cervical disc degeneration, cervicothoracic region: Secondary | ICD-10-CM | POA: Diagnosis not present

## 2022-06-26 DIAGNOSIS — M5136 Other intervertebral disc degeneration, lumbar region: Secondary | ICD-10-CM | POA: Diagnosis not present

## 2022-06-30 DIAGNOSIS — M5033 Other cervical disc degeneration, cervicothoracic region: Secondary | ICD-10-CM | POA: Diagnosis not present

## 2022-06-30 DIAGNOSIS — M5136 Other intervertebral disc degeneration, lumbar region: Secondary | ICD-10-CM | POA: Diagnosis not present

## 2022-06-30 DIAGNOSIS — M9903 Segmental and somatic dysfunction of lumbar region: Secondary | ICD-10-CM | POA: Diagnosis not present

## 2022-06-30 DIAGNOSIS — M9901 Segmental and somatic dysfunction of cervical region: Secondary | ICD-10-CM | POA: Diagnosis not present

## 2022-07-01 DIAGNOSIS — F4310 Post-traumatic stress disorder, unspecified: Secondary | ICD-10-CM | POA: Diagnosis not present

## 2022-07-02 DIAGNOSIS — M25551 Pain in right hip: Secondary | ICD-10-CM | POA: Diagnosis not present

## 2022-07-03 DIAGNOSIS — M5033 Other cervical disc degeneration, cervicothoracic region: Secondary | ICD-10-CM | POA: Diagnosis not present

## 2022-07-03 DIAGNOSIS — M5136 Other intervertebral disc degeneration, lumbar region: Secondary | ICD-10-CM | POA: Diagnosis not present

## 2022-07-03 DIAGNOSIS — M9903 Segmental and somatic dysfunction of lumbar region: Secondary | ICD-10-CM | POA: Diagnosis not present

## 2022-07-03 DIAGNOSIS — M9901 Segmental and somatic dysfunction of cervical region: Secondary | ICD-10-CM | POA: Diagnosis not present

## 2022-07-07 DIAGNOSIS — M9901 Segmental and somatic dysfunction of cervical region: Secondary | ICD-10-CM | POA: Diagnosis not present

## 2022-07-07 DIAGNOSIS — M9903 Segmental and somatic dysfunction of lumbar region: Secondary | ICD-10-CM | POA: Diagnosis not present

## 2022-07-07 DIAGNOSIS — M5136 Other intervertebral disc degeneration, lumbar region: Secondary | ICD-10-CM | POA: Diagnosis not present

## 2022-07-07 DIAGNOSIS — M5033 Other cervical disc degeneration, cervicothoracic region: Secondary | ICD-10-CM | POA: Diagnosis not present

## 2022-07-10 DIAGNOSIS — M5136 Other intervertebral disc degeneration, lumbar region: Secondary | ICD-10-CM | POA: Diagnosis not present

## 2022-07-10 DIAGNOSIS — M9901 Segmental and somatic dysfunction of cervical region: Secondary | ICD-10-CM | POA: Diagnosis not present

## 2022-07-10 DIAGNOSIS — M5033 Other cervical disc degeneration, cervicothoracic region: Secondary | ICD-10-CM | POA: Diagnosis not present

## 2022-07-10 DIAGNOSIS — M9903 Segmental and somatic dysfunction of lumbar region: Secondary | ICD-10-CM | POA: Diagnosis not present

## 2022-07-15 ENCOUNTER — Encounter: Payer: Self-pay | Admitting: Family Medicine

## 2022-07-15 DIAGNOSIS — F4310 Post-traumatic stress disorder, unspecified: Secondary | ICD-10-CM | POA: Diagnosis not present

## 2022-07-15 DIAGNOSIS — F330 Major depressive disorder, recurrent, mild: Secondary | ICD-10-CM | POA: Diagnosis not present

## 2022-07-15 DIAGNOSIS — F410 Panic disorder [episodic paroxysmal anxiety] without agoraphobia: Secondary | ICD-10-CM | POA: Diagnosis not present

## 2022-07-15 DIAGNOSIS — F4312 Post-traumatic stress disorder, chronic: Secondary | ICD-10-CM | POA: Diagnosis not present

## 2022-07-17 DIAGNOSIS — M9903 Segmental and somatic dysfunction of lumbar region: Secondary | ICD-10-CM | POA: Diagnosis not present

## 2022-07-17 DIAGNOSIS — M5136 Other intervertebral disc degeneration, lumbar region: Secondary | ICD-10-CM | POA: Diagnosis not present

## 2022-07-17 DIAGNOSIS — M9901 Segmental and somatic dysfunction of cervical region: Secondary | ICD-10-CM | POA: Diagnosis not present

## 2022-07-17 DIAGNOSIS — M5033 Other cervical disc degeneration, cervicothoracic region: Secondary | ICD-10-CM | POA: Diagnosis not present

## 2022-07-21 DIAGNOSIS — M5033 Other cervical disc degeneration, cervicothoracic region: Secondary | ICD-10-CM | POA: Diagnosis not present

## 2022-07-21 DIAGNOSIS — M9903 Segmental and somatic dysfunction of lumbar region: Secondary | ICD-10-CM | POA: Diagnosis not present

## 2022-07-21 DIAGNOSIS — M5136 Other intervertebral disc degeneration, lumbar region: Secondary | ICD-10-CM | POA: Diagnosis not present

## 2022-07-21 DIAGNOSIS — M9901 Segmental and somatic dysfunction of cervical region: Secondary | ICD-10-CM | POA: Diagnosis not present

## 2022-07-23 DIAGNOSIS — M5033 Other cervical disc degeneration, cervicothoracic region: Secondary | ICD-10-CM | POA: Diagnosis not present

## 2022-07-23 DIAGNOSIS — M9903 Segmental and somatic dysfunction of lumbar region: Secondary | ICD-10-CM | POA: Diagnosis not present

## 2022-07-23 DIAGNOSIS — M9901 Segmental and somatic dysfunction of cervical region: Secondary | ICD-10-CM | POA: Diagnosis not present

## 2022-07-23 DIAGNOSIS — M5136 Other intervertebral disc degeneration, lumbar region: Secondary | ICD-10-CM | POA: Diagnosis not present

## 2022-07-30 DIAGNOSIS — M9903 Segmental and somatic dysfunction of lumbar region: Secondary | ICD-10-CM | POA: Diagnosis not present

## 2022-07-30 DIAGNOSIS — M5136 Other intervertebral disc degeneration, lumbar region: Secondary | ICD-10-CM | POA: Diagnosis not present

## 2022-07-30 DIAGNOSIS — M5033 Other cervical disc degeneration, cervicothoracic region: Secondary | ICD-10-CM | POA: Diagnosis not present

## 2022-07-30 DIAGNOSIS — M9901 Segmental and somatic dysfunction of cervical region: Secondary | ICD-10-CM | POA: Diagnosis not present

## 2022-08-06 DIAGNOSIS — M5033 Other cervical disc degeneration, cervicothoracic region: Secondary | ICD-10-CM | POA: Diagnosis not present

## 2022-08-06 DIAGNOSIS — M5136 Other intervertebral disc degeneration, lumbar region: Secondary | ICD-10-CM | POA: Diagnosis not present

## 2022-08-06 DIAGNOSIS — M9903 Segmental and somatic dysfunction of lumbar region: Secondary | ICD-10-CM | POA: Diagnosis not present

## 2022-08-06 DIAGNOSIS — M9901 Segmental and somatic dysfunction of cervical region: Secondary | ICD-10-CM | POA: Diagnosis not present

## 2022-08-10 ENCOUNTER — Encounter: Payer: Self-pay | Admitting: Family Medicine

## 2022-08-12 DIAGNOSIS — M9903 Segmental and somatic dysfunction of lumbar region: Secondary | ICD-10-CM | POA: Diagnosis not present

## 2022-08-12 DIAGNOSIS — M5033 Other cervical disc degeneration, cervicothoracic region: Secondary | ICD-10-CM | POA: Diagnosis not present

## 2022-08-12 DIAGNOSIS — M5136 Other intervertebral disc degeneration, lumbar region: Secondary | ICD-10-CM | POA: Diagnosis not present

## 2022-08-12 DIAGNOSIS — M9901 Segmental and somatic dysfunction of cervical region: Secondary | ICD-10-CM | POA: Diagnosis not present

## 2022-08-12 DIAGNOSIS — F4310 Post-traumatic stress disorder, unspecified: Secondary | ICD-10-CM | POA: Diagnosis not present

## 2022-08-13 ENCOUNTER — Ambulatory Visit: Payer: BC Managed Care – PPO | Admitting: Cardiovascular Disease

## 2022-08-18 ENCOUNTER — Ambulatory Visit: Payer: BC Managed Care – PPO | Admitting: Cardiovascular Disease

## 2022-08-19 DIAGNOSIS — M9903 Segmental and somatic dysfunction of lumbar region: Secondary | ICD-10-CM | POA: Diagnosis not present

## 2022-08-19 DIAGNOSIS — M5033 Other cervical disc degeneration, cervicothoracic region: Secondary | ICD-10-CM | POA: Diagnosis not present

## 2022-08-19 DIAGNOSIS — M9901 Segmental and somatic dysfunction of cervical region: Secondary | ICD-10-CM | POA: Diagnosis not present

## 2022-08-19 DIAGNOSIS — M5136 Other intervertebral disc degeneration, lumbar region: Secondary | ICD-10-CM | POA: Diagnosis not present

## 2022-08-21 DIAGNOSIS — M5033 Other cervical disc degeneration, cervicothoracic region: Secondary | ICD-10-CM | POA: Diagnosis not present

## 2022-08-21 DIAGNOSIS — M9901 Segmental and somatic dysfunction of cervical region: Secondary | ICD-10-CM | POA: Diagnosis not present

## 2022-08-21 DIAGNOSIS — M5136 Other intervertebral disc degeneration, lumbar region: Secondary | ICD-10-CM | POA: Diagnosis not present

## 2022-08-21 DIAGNOSIS — M9903 Segmental and somatic dysfunction of lumbar region: Secondary | ICD-10-CM | POA: Diagnosis not present

## 2022-08-25 DIAGNOSIS — M9901 Segmental and somatic dysfunction of cervical region: Secondary | ICD-10-CM | POA: Diagnosis not present

## 2022-08-25 DIAGNOSIS — M9903 Segmental and somatic dysfunction of lumbar region: Secondary | ICD-10-CM | POA: Diagnosis not present

## 2022-08-25 DIAGNOSIS — M5136 Other intervertebral disc degeneration, lumbar region: Secondary | ICD-10-CM | POA: Diagnosis not present

## 2022-08-25 DIAGNOSIS — M5033 Other cervical disc degeneration, cervicothoracic region: Secondary | ICD-10-CM | POA: Diagnosis not present

## 2022-08-28 DIAGNOSIS — M5033 Other cervical disc degeneration, cervicothoracic region: Secondary | ICD-10-CM | POA: Diagnosis not present

## 2022-08-28 DIAGNOSIS — M9901 Segmental and somatic dysfunction of cervical region: Secondary | ICD-10-CM | POA: Diagnosis not present

## 2022-08-28 DIAGNOSIS — M5136 Other intervertebral disc degeneration, lumbar region: Secondary | ICD-10-CM | POA: Diagnosis not present

## 2022-08-28 DIAGNOSIS — M9903 Segmental and somatic dysfunction of lumbar region: Secondary | ICD-10-CM | POA: Diagnosis not present

## 2022-09-01 DIAGNOSIS — M5033 Other cervical disc degeneration, cervicothoracic region: Secondary | ICD-10-CM | POA: Diagnosis not present

## 2022-09-01 DIAGNOSIS — M9901 Segmental and somatic dysfunction of cervical region: Secondary | ICD-10-CM | POA: Diagnosis not present

## 2022-09-01 DIAGNOSIS — M9903 Segmental and somatic dysfunction of lumbar region: Secondary | ICD-10-CM | POA: Diagnosis not present

## 2022-09-01 DIAGNOSIS — M5136 Other intervertebral disc degeneration, lumbar region: Secondary | ICD-10-CM | POA: Diagnosis not present

## 2022-09-03 DIAGNOSIS — M5136 Other intervertebral disc degeneration, lumbar region: Secondary | ICD-10-CM | POA: Diagnosis not present

## 2022-09-03 DIAGNOSIS — M9901 Segmental and somatic dysfunction of cervical region: Secondary | ICD-10-CM | POA: Diagnosis not present

## 2022-09-03 DIAGNOSIS — M5033 Other cervical disc degeneration, cervicothoracic region: Secondary | ICD-10-CM | POA: Diagnosis not present

## 2022-09-03 DIAGNOSIS — M9903 Segmental and somatic dysfunction of lumbar region: Secondary | ICD-10-CM | POA: Diagnosis not present

## 2022-09-08 DIAGNOSIS — M9901 Segmental and somatic dysfunction of cervical region: Secondary | ICD-10-CM | POA: Diagnosis not present

## 2022-09-08 DIAGNOSIS — M5033 Other cervical disc degeneration, cervicothoracic region: Secondary | ICD-10-CM | POA: Diagnosis not present

## 2022-09-08 DIAGNOSIS — M5136 Other intervertebral disc degeneration, lumbar region: Secondary | ICD-10-CM | POA: Diagnosis not present

## 2022-09-08 DIAGNOSIS — M9903 Segmental and somatic dysfunction of lumbar region: Secondary | ICD-10-CM | POA: Diagnosis not present

## 2022-09-09 DIAGNOSIS — F4310 Post-traumatic stress disorder, unspecified: Secondary | ICD-10-CM | POA: Diagnosis not present

## 2022-09-14 ENCOUNTER — Other Ambulatory Visit: Payer: Self-pay | Admitting: Family Medicine

## 2022-09-18 DIAGNOSIS — M5136 Other intervertebral disc degeneration, lumbar region: Secondary | ICD-10-CM | POA: Diagnosis not present

## 2022-09-18 DIAGNOSIS — M5033 Other cervical disc degeneration, cervicothoracic region: Secondary | ICD-10-CM | POA: Diagnosis not present

## 2022-09-18 DIAGNOSIS — M9901 Segmental and somatic dysfunction of cervical region: Secondary | ICD-10-CM | POA: Diagnosis not present

## 2022-09-18 DIAGNOSIS — M9903 Segmental and somatic dysfunction of lumbar region: Secondary | ICD-10-CM | POA: Diagnosis not present

## 2022-09-22 DIAGNOSIS — M5033 Other cervical disc degeneration, cervicothoracic region: Secondary | ICD-10-CM | POA: Diagnosis not present

## 2022-09-22 DIAGNOSIS — M5136 Other intervertebral disc degeneration, lumbar region: Secondary | ICD-10-CM | POA: Diagnosis not present

## 2022-09-22 DIAGNOSIS — M9903 Segmental and somatic dysfunction of lumbar region: Secondary | ICD-10-CM | POA: Diagnosis not present

## 2022-09-22 DIAGNOSIS — M9901 Segmental and somatic dysfunction of cervical region: Secondary | ICD-10-CM | POA: Diagnosis not present

## 2022-09-25 DIAGNOSIS — M9903 Segmental and somatic dysfunction of lumbar region: Secondary | ICD-10-CM | POA: Diagnosis not present

## 2022-09-25 DIAGNOSIS — M9901 Segmental and somatic dysfunction of cervical region: Secondary | ICD-10-CM | POA: Diagnosis not present

## 2022-09-25 DIAGNOSIS — M5033 Other cervical disc degeneration, cervicothoracic region: Secondary | ICD-10-CM | POA: Diagnosis not present

## 2022-09-25 DIAGNOSIS — M5136 Other intervertebral disc degeneration, lumbar region: Secondary | ICD-10-CM | POA: Diagnosis not present

## 2022-09-29 DIAGNOSIS — M9901 Segmental and somatic dysfunction of cervical region: Secondary | ICD-10-CM | POA: Diagnosis not present

## 2022-09-29 DIAGNOSIS — M9903 Segmental and somatic dysfunction of lumbar region: Secondary | ICD-10-CM | POA: Diagnosis not present

## 2022-09-29 DIAGNOSIS — M5033 Other cervical disc degeneration, cervicothoracic region: Secondary | ICD-10-CM | POA: Diagnosis not present

## 2022-09-29 DIAGNOSIS — M5136 Other intervertebral disc degeneration, lumbar region: Secondary | ICD-10-CM | POA: Diagnosis not present

## 2022-10-01 DIAGNOSIS — M5136 Other intervertebral disc degeneration, lumbar region: Secondary | ICD-10-CM | POA: Diagnosis not present

## 2022-10-01 DIAGNOSIS — M9903 Segmental and somatic dysfunction of lumbar region: Secondary | ICD-10-CM | POA: Diagnosis not present

## 2022-10-01 DIAGNOSIS — M9901 Segmental and somatic dysfunction of cervical region: Secondary | ICD-10-CM | POA: Diagnosis not present

## 2022-10-01 DIAGNOSIS — M5033 Other cervical disc degeneration, cervicothoracic region: Secondary | ICD-10-CM | POA: Diagnosis not present

## 2022-10-06 DIAGNOSIS — M5033 Other cervical disc degeneration, cervicothoracic region: Secondary | ICD-10-CM | POA: Diagnosis not present

## 2022-10-06 DIAGNOSIS — M5136 Other intervertebral disc degeneration, lumbar region: Secondary | ICD-10-CM | POA: Diagnosis not present

## 2022-10-06 DIAGNOSIS — M9903 Segmental and somatic dysfunction of lumbar region: Secondary | ICD-10-CM | POA: Diagnosis not present

## 2022-10-06 DIAGNOSIS — M9901 Segmental and somatic dysfunction of cervical region: Secondary | ICD-10-CM | POA: Diagnosis not present

## 2022-10-07 DIAGNOSIS — F410 Panic disorder [episodic paroxysmal anxiety] without agoraphobia: Secondary | ICD-10-CM | POA: Diagnosis not present

## 2022-10-07 DIAGNOSIS — F4312 Post-traumatic stress disorder, chronic: Secondary | ICD-10-CM | POA: Diagnosis not present

## 2022-10-07 DIAGNOSIS — F330 Major depressive disorder, recurrent, mild: Secondary | ICD-10-CM | POA: Diagnosis not present

## 2022-10-14 DIAGNOSIS — M5033 Other cervical disc degeneration, cervicothoracic region: Secondary | ICD-10-CM | POA: Diagnosis not present

## 2022-10-14 DIAGNOSIS — M9903 Segmental and somatic dysfunction of lumbar region: Secondary | ICD-10-CM | POA: Diagnosis not present

## 2022-10-14 DIAGNOSIS — M5136 Other intervertebral disc degeneration, lumbar region: Secondary | ICD-10-CM | POA: Diagnosis not present

## 2022-10-14 DIAGNOSIS — M9901 Segmental and somatic dysfunction of cervical region: Secondary | ICD-10-CM | POA: Diagnosis not present

## 2022-10-16 DIAGNOSIS — M5033 Other cervical disc degeneration, cervicothoracic region: Secondary | ICD-10-CM | POA: Diagnosis not present

## 2022-10-16 DIAGNOSIS — M9901 Segmental and somatic dysfunction of cervical region: Secondary | ICD-10-CM | POA: Diagnosis not present

## 2022-10-16 DIAGNOSIS — M9903 Segmental and somatic dysfunction of lumbar region: Secondary | ICD-10-CM | POA: Diagnosis not present

## 2022-10-16 DIAGNOSIS — M5136 Other intervertebral disc degeneration, lumbar region: Secondary | ICD-10-CM | POA: Diagnosis not present

## 2022-10-19 ENCOUNTER — Other Ambulatory Visit: Payer: Self-pay | Admitting: Family Medicine

## 2022-10-20 NOTE — Progress Notes (Signed)
Cardiology Office Note:   Date:  10/29/2022  NAME:  Scott Rasmussen    MRN: [id number] DOB:  05-15-1965   PCP:  [name], DO  Cardiologist:  Reatha Harps, MD  Electrophysiologist:  None   Referring MD: [name], DO   Chief Complaint  Patient presents with   Follow-up    History of Present Illness:   Scott Rasmussen is a 57 y.o. male with a hx of CAD, HLD, OSA who presents for follow-up.  He reports he is doing well.  Denies any chest pain or trouble breathing.  Seen in the emergency room in February for chest discomfort.  Troponins were negative.  Reports he was going through a lawsuit.  Suspect stress was a big issue.  His triglycerides are still a bit elevated.  He is on Crestor 10 mg daily and Vascepa.  No pancreatitis.  His stress test was performed in 2020 and was likely just artifact.  Denies any symptoms today.  Blood pressure is well-controlled.  He is playing golf 5-6 times a week.  He is retired.  Denies any concerns for angina today.  Problem List 1. Covid-19 PNA 2. CAD  -mild ischemia in basal to mid anterior wall, intermediate risk, NM SPECT 03/15/2019 -CAC score 0 2015 3. HLD  -T chol 130, HDL 30, LDL 50, TG 307 4. Former tobacco abuse 5. OSA -moderate 6. HTN  Past Medical History: Past Medical History:  Diagnosis Date   Gout    Hallux rigidus 10/11/2018   right    HTN (hypertension)    Hyperlipidemia    Shortness of breath    Sleep apnea    had sx    Umbilical hernia     Past Surgical History: Past Surgical History:  Procedure Laterality Date   ROTATOR CUFF REPAIR     TONSILLECTOMY AND ADENOIDECTOMY  2008   UVULOPALATOPHARYNGOPLASTY  2008    Current Medications: Current Meds  Medication Sig   amoxicillin (AMOXIL) 500 MG capsule Take 1 capsule (500 mg total) by mouth 2 (two) times daily.   buPROPion ER (WELLBUTRIN SR) 100 MG 12 hr tablet Take 1 tablet (100 mg total) by mouth 2 (two) times daily. (NEEDS TO BE SEEN BEFORE  NEXT REFILL)   famotidine (PEPCID) 20 MG tablet Take 1 tablet (20 mg total) by mouth 2 (two) times daily.   febuxostat (ULORIC) 40 MG tablet Take 1 tablet (40 mg total) by mouth daily.   finasteride (PROPECIA) 1 MG tablet Take 1 mg by mouth daily.   fluticasone (FLONASE) 50 MCG/ACT nasal spray Place 2 sprays into both nostrils daily.   hydrocortisone (PROCTOZONE-HC) 2.5 % rectal cream Place 1 Application rectally 2 (two) times daily as needed for hemorrhoids or anal itching.   icosapent Ethyl (VASCEPA) 1 g capsule Take 2 capsules (2 g total) by mouth 2 (two) times daily.   loratadine (CLARITIN) 10 MG tablet Take 1 tablet (10 mg total) by mouth daily.   LORazepam (ATIVAN) 0.5 MG tablet    meloxicam (MOBIC) 15 MG tablet Take 15 mg by mouth daily. prn   metoprolol tartrate (LOPRESSOR) 25 MG tablet Take 1 tablet (25 mg total) by mouth 2 (two) times daily.   omeprazole (PRILOSEC) 40 MG capsule Take 1 capsule (40 mg total) by mouth daily.   ROGAINE EXTRA STRENGTH 5 % SOLN Apply topically.   rosuvastatin (CRESTOR) 10 MG tablet Take 1 tablet (10 mg total) by mouth daily.   Salicylic Acid 6 %  CREA Apply topically.   Semaglutide-Weight Management (WEGOVY) 2.4 MG/0.75ML SOAJ Inject 2.4 mg into the skin every 7 (seven) days.   sildenafil (VIAGRA) 100 MG tablet Take 0.5-1 tablets (50-100 mg total) by mouth daily as needed for erectile dysfunction.   testosterone cypionate (DEPOTESTOSTERONE CYPIONATE) 200 MG/ML injection Inject 200 mg into the muscle once a week.   Vitamin D, Ergocalciferol, (DRISDOL) 1.25 MG (50000 UNIT) CAPS capsule TAKE 1 CAPSULE EVERY 7 DAYS     Allergies:    Depo-medrol [methylprednisolone sodium succ]   Social History: Social History   Socioeconomic History   Marital status: Married    Spouse name: Not on file   Number of children: Not on file   Years of education: Not on file   Highest education level: Not on file  Occupational History   Not on file  Tobacco Use    Smoking status: Some Days    Packs/day: 1.00    Years: 15.00    Additional pack years: 0.00    Total pack years: 15.00    Types: Cigarettes    Last attempt to quit: 2012    Years since quitting: 12.5   Smokeless tobacco: Never  Vaping Use   Vaping Use: Never used  Substance and Sexual Activity   Alcohol use: Yes    Comment: 2 drinks per month per pt.   Drug use: Not Currently    Types: Marijuana   Sexual activity: Not on file  Other Topics Concern   Not on file  Social History Narrative   Works at [employer] investment banker Strain: Not on [employer] Insecurity: Not on file  Transportation Needs: Not on file  Physical Activity: Not on file  Stress: Not on file  Social Connections: Not on file     Family History: The patient's family history includes Healthy in his daughter, mother, and son; Heart attack in his father. There is no history of Colon cancer, Esophageal cancer, Rectal cancer, or Stomach cancer.  ROS:   All other ROS reviewed and negative. Pertinent positives noted in the HPI.     EKGs/Labs/Other Studies Reviewed:   The following studies were personally reviewed by me today:  EKG:  EKG is not ordered today.        Recent Labs: 01/15/2022: TSH 2.430 05/19/2022: ALT 35; BUN 13; Creatinine, Ser 0.96; Hemoglobin 17.7; Platelets 282; Potassium 4.1; Sodium 140   Recent Lipid Panel    Component Value Date/Time   CHOL 130 04/11/2022 0909   TRIG 307 (H) 04/11/2022 0909   TRIG 242 (H) 10/17/2016 0806   HDL 33 (L) 04/11/2022 0909   HDL 39 (L) 10/17/2016 0806   CHOLHDL 3.9 04/11/2022 0909   LDLCALC 50 04/11/2022 0909   LDLCALC 86 11/28/2013 1024   LDLDIRECT 59 07/03/2020 1108    Physical Exam:   VS:  BP 129/86   Pulse 90   Ht 5' 10.5" (1.791 m)   Wt 228 lb (103.4 kg)   SpO2 96%   BMI 32.25 kg/m    Wt Readings from Last 3 Encounters:  10/29/22 228 lb (103.4 kg)  04/11/22 244 lb 3.2 oz (110.8 kg)  02/07/22  244 lb 3.2 oz (110.8 kg)    General: Well nourished, well developed, in no acute distress Head: Atraumatic, normal size  Eyes: PEERLA, EOMI  Neck: Supple, no JVD Endocrine: No thryomegaly Cardiac: Normal S1, S2; RRR; no murmurs, rubs, or gallops Lungs: Clear  to auscultation bilaterally, no wheezing, rhonchi or rales  Abd: Soft, nontender, no hepatomegaly  Ext: No edema, pulses 2+ Musculoskeletal: No deformities, BUE and BLE strength normal and equal Skin: Warm and dry, no rashes   Neuro: Alert and oriented to person, place, time, and situation, CNII-XII grossly intact, no focal deficits  Psych: Normal mood and affect   ASSESSMENT:   Scott Rasmussen is a 57 y.o. male who presents for the following: 1. Coronary artery disease involving native coronary artery of native heart without angina pectoris   2. Mixed hyperlipidemia   3. Primary hypertension     PLAN:   1. Coronary artery disease involving native coronary artery of native heart without angina pectoris 2. Mixed hyperlipidemia -Abnormal stress test in 2021.  Suspect this was artifact.  LDL at goal.  Triglycerides are an issue.  On Crestor and Vascepa.  No pancreatitis.  Would just continue this regimen for now.  I suspect this stress test was actually artifact.  If he has further symptoms would recommend coronary CTA.  Should continue aspirin 81 mg daily as well.  On metoprolol.  No symptoms of angina.  3. Primary hypertension -Well-controlled.  No change to medications.  Disposition: Return in about 1 year (around 10/29/2023).  Medication Adjustments/Labs and Tests Ordered: Current medicines are reviewed at length with the patient today.  Concerns regarding medicines are outlined above.  No orders of the defined types were placed in this encounter.  No orders of the defined types were placed in this encounter.  Patient Instructions  Medication Instructions:  NO CHANGES  *If you need a refill on your cardiac medications  before your next appointment, please call your pharmacy*   Follow-Up: At [facility], you and your health needs are our priority.  As part of our continuing mission to provide you with exceptional heart care, we have created designated Provider Care Teams.  These Care Teams include your primary Cardiologist (physician) and Advanced Practice Providers (APPs -  Physician Assistants and Nurse Practitioners) who all work together to provide you with the care you need, when you need it.  We recommend signing up for the patient portal called "MyChart".  Sign up information is provided on this After Visit Summary.  MyChart is used to connect with patients for Virtual Visits (Telemedicine).  Patients are able to view lab/test results, encounter notes, upcoming appointments, etc.  Non-urgent messages can be sent to your provider as well.   To learn more about what you can do with MyChart, go to [email or web address].    Your next appointment:    12 months with Dr. Flora Lipps    Time Spent with Patient: I have spent a total of 25 minutes with patient reviewing hospital notes, telemetry, EKGs, labs and examining the patient as well as establishing an assessment and plan that was discussed with the patient.  > 50% of time was spent in direct patient care.  Signed, Lenna Gilford. Flora Lipps, MD, [facility]  [facility]  [address], [address] [place], [place] [address] [phone]  10/29/2022 9:57 AM

## 2022-10-20 NOTE — Telephone Encounter (Signed)
[place] pt NTBS 30-d given 10/05/22

## 2022-10-21 ENCOUNTER — Encounter: Payer: Self-pay | Admitting: Family Medicine

## 2022-10-21 NOTE — Telephone Encounter (Signed)
Letter mailed

## 2022-10-29 ENCOUNTER — Ambulatory Visit: Payer: BC Managed Care – PPO | Attending: Cardiovascular Disease | Admitting: Cardiovascular Disease

## 2022-10-29 ENCOUNTER — Encounter: Payer: Self-pay | Admitting: Cardiovascular Disease

## 2022-10-29 VITALS — BP 129/86 | HR 90 | Ht 70.5 in | Wt 228.0 lb

## 2022-10-29 DIAGNOSIS — I10 Essential (primary) hypertension: Secondary | ICD-10-CM | POA: Diagnosis not present

## 2022-10-29 DIAGNOSIS — M9903 Segmental and somatic dysfunction of lumbar region: Secondary | ICD-10-CM | POA: Diagnosis not present

## 2022-10-29 DIAGNOSIS — M5033 Other cervical disc degeneration, cervicothoracic region: Secondary | ICD-10-CM | POA: Diagnosis not present

## 2022-10-29 DIAGNOSIS — M5136 Other intervertebral disc degeneration, lumbar region: Secondary | ICD-10-CM | POA: Diagnosis not present

## 2022-10-29 DIAGNOSIS — M9901 Segmental and somatic dysfunction of cervical region: Secondary | ICD-10-CM | POA: Diagnosis not present

## 2022-10-29 DIAGNOSIS — I2510 Atherosclerotic heart disease of native coronary artery without angina pectoris: Secondary | ICD-10-CM

## 2022-10-29 DIAGNOSIS — E782 Mixed hyperlipidemia: Secondary | ICD-10-CM

## 2022-10-29 DIAGNOSIS — M5416 Radiculopathy, lumbar region: Secondary | ICD-10-CM | POA: Diagnosis not present

## 2022-10-29 NOTE — Patient Instructions (Signed)
Medication Instructions:  NO CHANGES  *If you need a refill on your cardiac medications before your next appointment, please call your pharmacy*   Follow-Up: At [facility], you and your health needs are our priority.  As part of our continuing mission to provide you with exceptional heart care, we have created designated Provider Care Teams.  These Care Teams include your primary Cardiologist (physician) and Advanced Practice Providers (APPs -  Physician Assistants and Nurse Practitioners) who all work together to provide you with the care you need, when you need it.  We recommend signing up for the patient portal called "MyChart".  Sign up information is provided on this After Visit Summary.  MyChart is used to connect with patients for Virtual Visits (Telemedicine).  Patients are able to view lab/test results, encounter notes, upcoming appointments, etc.  Non-urgent messages can be sent to your provider as well.   To learn more about what you can do with MyChart, go to [email or web address].    Your next appointment:   12 months with Dr. O'Neal 

## 2022-11-05 DIAGNOSIS — M5033 Other cervical disc degeneration, cervicothoracic region: Secondary | ICD-10-CM | POA: Diagnosis not present

## 2022-11-05 DIAGNOSIS — M9903 Segmental and somatic dysfunction of lumbar region: Secondary | ICD-10-CM | POA: Diagnosis not present

## 2022-11-05 DIAGNOSIS — M9901 Segmental and somatic dysfunction of cervical region: Secondary | ICD-10-CM | POA: Diagnosis not present

## 2022-11-05 DIAGNOSIS — M5136 Other intervertebral disc degeneration, lumbar region: Secondary | ICD-10-CM | POA: Diagnosis not present

## 2022-11-11 DIAGNOSIS — M5416 Radiculopathy, lumbar region: Secondary | ICD-10-CM | POA: Diagnosis not present

## 2022-11-12 DIAGNOSIS — M5033 Other cervical disc degeneration, cervicothoracic region: Secondary | ICD-10-CM | POA: Diagnosis not present

## 2022-11-12 DIAGNOSIS — M5136 Other intervertebral disc degeneration, lumbar region: Secondary | ICD-10-CM | POA: Diagnosis not present

## 2022-11-12 DIAGNOSIS — M9903 Segmental and somatic dysfunction of lumbar region: Secondary | ICD-10-CM | POA: Diagnosis not present

## 2022-11-12 DIAGNOSIS — M9901 Segmental and somatic dysfunction of cervical region: Secondary | ICD-10-CM | POA: Diagnosis not present

## 2022-11-17 DIAGNOSIS — M9901 Segmental and somatic dysfunction of cervical region: Secondary | ICD-10-CM | POA: Diagnosis not present

## 2022-11-17 DIAGNOSIS — G4733 Obstructive sleep apnea (adult) (pediatric): Secondary | ICD-10-CM | POA: Diagnosis not present

## 2022-11-17 DIAGNOSIS — M5033 Other cervical disc degeneration, cervicothoracic region: Secondary | ICD-10-CM | POA: Diagnosis not present

## 2022-11-17 DIAGNOSIS — M5136 Other intervertebral disc degeneration, lumbar region: Secondary | ICD-10-CM | POA: Diagnosis not present

## 2022-11-17 DIAGNOSIS — M9903 Segmental and somatic dysfunction of lumbar region: Secondary | ICD-10-CM | POA: Diagnosis not present

## 2022-11-20 DIAGNOSIS — M5416 Radiculopathy, lumbar region: Secondary | ICD-10-CM | POA: Diagnosis not present

## 2022-11-24 DIAGNOSIS — M5136 Other intervertebral disc degeneration, lumbar region: Secondary | ICD-10-CM | POA: Diagnosis not present

## 2022-11-24 DIAGNOSIS — M9901 Segmental and somatic dysfunction of cervical region: Secondary | ICD-10-CM | POA: Diagnosis not present

## 2022-11-24 DIAGNOSIS — M9903 Segmental and somatic dysfunction of lumbar region: Secondary | ICD-10-CM | POA: Diagnosis not present

## 2022-11-24 DIAGNOSIS — M5033 Other cervical disc degeneration, cervicothoracic region: Secondary | ICD-10-CM | POA: Diagnosis not present

## 2022-12-02 DIAGNOSIS — M5136 Other intervertebral disc degeneration, lumbar region: Secondary | ICD-10-CM | POA: Diagnosis not present

## 2022-12-02 DIAGNOSIS — M5033 Other cervical disc degeneration, cervicothoracic region: Secondary | ICD-10-CM | POA: Diagnosis not present

## 2022-12-02 DIAGNOSIS — M9901 Segmental and somatic dysfunction of cervical region: Secondary | ICD-10-CM | POA: Diagnosis not present

## 2022-12-02 DIAGNOSIS — M9903 Segmental and somatic dysfunction of lumbar region: Secondary | ICD-10-CM | POA: Diagnosis not present

## 2022-12-09 DIAGNOSIS — F4310 Post-traumatic stress disorder, unspecified: Secondary | ICD-10-CM | POA: Diagnosis not present

## 2022-12-10 ENCOUNTER — Other Ambulatory Visit: Payer: Self-pay | Admitting: Family Medicine

## 2022-12-10 ENCOUNTER — Encounter: Payer: Self-pay | Admitting: Family Medicine

## 2022-12-10 ENCOUNTER — Ambulatory Visit (INDEPENDENT_AMBULATORY_CARE_PROVIDER_SITE_OTHER): Payer: BC Managed Care – PPO | Admitting: Family Medicine

## 2022-12-10 ENCOUNTER — Telehealth: Payer: Self-pay | Admitting: Family Medicine

## 2022-12-10 VITALS — BP 121/82 | HR 77 | Temp 97.4°F | Ht 70.5 in | Wt 224.6 lb

## 2022-12-10 DIAGNOSIS — J40 Bronchitis, not specified as acute or chronic: Secondary | ICD-10-CM

## 2022-12-10 DIAGNOSIS — M5033 Other cervical disc degeneration, cervicothoracic region: Secondary | ICD-10-CM | POA: Diagnosis not present

## 2022-12-10 DIAGNOSIS — M9901 Segmental and somatic dysfunction of cervical region: Secondary | ICD-10-CM | POA: Diagnosis not present

## 2022-12-10 DIAGNOSIS — J329 Chronic sinusitis, unspecified: Secondary | ICD-10-CM

## 2022-12-10 DIAGNOSIS — M9903 Segmental and somatic dysfunction of lumbar region: Secondary | ICD-10-CM | POA: Diagnosis not present

## 2022-12-10 DIAGNOSIS — M5136 Other intervertebral disc degeneration, lumbar region: Secondary | ICD-10-CM | POA: Diagnosis not present

## 2022-12-10 MED ORDER — LEVOFLOXACIN 500 MG PO TABS: 500.0000 mg | ORAL_TABLET | Freq: Every day | ORAL | 0 refills | Status: DC

## 2022-12-10 MED ORDER — PSEUDOEPHEDRINE-CODEINE-GG 30-10-100 MG/5ML PO SOLN: 10.0000 mL | Freq: Four times a day (QID) | ORAL | 0 refills | Status: DC | PRN

## 2022-12-10 MED ORDER — GUAIFENESIN-CODEINE 200-20 MG/10ML PO SOLN: 5.0000 mL | ORAL | 0 refills | Status: DC | PRN

## 2022-12-10 NOTE — Telephone Encounter (Signed)
Please let the patient know that I sent their prescription to their pharmacy. Thanks, WS 

## 2022-12-10 NOTE — Telephone Encounter (Signed)
Left detailed message (dpr)

## 2022-12-10 NOTE — Telephone Encounter (Signed)
Pharmacy called to let Dr Darlyn Read know that the Mytussin Rx that was sent in for pt today has been discontinued. Will need to send new Rx in for pt. Says they do have the Guaifenesin Codeine Rx in stock if Dr Darlyn Read wants to send Rx for that and then pt can just buy Sudafed OTC to take with it, which would be the same combo as taking Mytussin. Please advise.

## 2022-12-10 NOTE — Progress Notes (Signed)
Subjective:  Patient ID: Scott Rasmussen, male    DOB: 1965/05/26  Age: 57 y.o. MRN: [id number]  CC: Cough, Nasal Congestion, Fatigue, and Nausea   HPI Scott Rasmussen presents for onset 1 week ago of cough, sneezing, nasal congestion, no apetite  and fatigue.   Smoker off and on.  No vomiting or diarrhea.     12/10/2022    9:43 AM 04/11/2022    9:12 AM 01/14/2022    2:24 PM  Depression screen PHQ 2/9  Decreased Interest 1 2 2   Down, Depressed, Hopeless 0 2 1  PHQ - 2 Score 1 4 3   Altered sleeping 1 2 2   Tired, decreased energy 1 3 3   Change in appetite 1 0 2  Feeling bad or failure about yourself  0 0 0  Trouble concentrating 0 2 2  Moving slowly or fidgety/restless 0 0 0  Suicidal thoughts 0 0 0  PHQ-9 Score 4 11 12   Difficult doing work/chores Not difficult at all Extremely dIfficult Somewhat difficult    History Scott Rasmussen has a past medical history of Gout, Hallux rigidus (10/11/2018), HTN (hypertension), Hyperlipidemia, Shortness of breath, Sleep apnea, and Umbilical hernia.   He has a past surgical history that includes Tonsillectomy and adenoidectomy (2008); Uvulopalatopharyngoplasty (2008); and Rotator cuff repair.   His family history includes Healthy in his daughter, mother, and son; Heart attack in his father.He reports that he has been smoking cigarettes. He started smoking about 27 years ago. He has a 15 pack-year smoking history. He has never used smokeless tobacco. He reports current alcohol use. He reports that he does not currently use drugs after having used the following drugs: Marijuana.    ROS Review of Systems  Constitutional:  Positive for appetite change and fatigue. Negative for activity change, chills and fever.  HENT:  Positive for congestion, postnasal drip and sneezing. Negative for ear discharge, ear pain, hearing loss, nosebleeds, rhinorrhea, sinus pressure and trouble swallowing.   Respiratory:  Negative for chest tightness and shortness of  breath.   Cardiovascular:  Negative for chest pain and palpitations.  Musculoskeletal:  Negative for arthralgias.  Skin:  Negative for rash.    Objective:  BP 121/82   Pulse 77   Temp (!) 97.4 F (36.3 C)   Ht 5' 10.5" (1.791 m)   Wt 224 lb 9.6 oz (101.9 kg)   SpO2 92%   BMI 31.77 kg/m   BP Readings from Last 3 Encounters:  12/10/22 121/82  10/29/22 129/86  05/19/22 128/79    Wt Readings from Last 3 Encounters:  12/10/22 224 lb 9.6 oz (101.9 kg)  10/29/22 228 lb (103.4 kg)  04/11/22 244 lb 3.2 oz (110.8 kg)     Physical Exam Constitutional:      Appearance: He is well-developed.  HENT:     Head: Normocephalic and atraumatic.     Right Ear: Tympanic membrane and external ear normal. No decreased hearing noted.     Left Ear: Tympanic membrane and external ear normal. No decreased hearing noted.     Nose: Mucosal edema present.     Right Sinus: No frontal sinus tenderness.     Left Sinus: No frontal sinus tenderness.     Mouth/Throat:     Pharynx: No oropharyngeal exudate or posterior oropharyngeal erythema.  Neck:     Meningeal: Brudzinski's sign absent.  Pulmonary:     Effort: No respiratory distress.     Breath sounds: Normal breath sounds.  Lymphadenopathy:  Head:     Right side of head: No preauricular adenopathy.     Left side of head: No preauricular adenopathy.     Cervical:     Right cervical: No superficial cervical adenopathy.    Left cervical: No superficial cervical adenopathy.       Assessment & Plan:   [name] was seen today for cough, nasal congestion, fatigue and nausea.  Diagnoses and all orders for this visit:  Sinobronchitis -     Novel Coronavirus, NAA ([facility]); Future  Other orders -     levofloxacin (LEVAQUIN) 500 MG tablet; Take 1 tablet (500 mg total) by mouth daily. -     pseudoephedrine-codeine-guaifenesin (MYTUSSIN DAC) 30-10-100 MG/5ML solution; Take 10 mLs by mouth 4 (four) times daily as needed for  cough.       I have discontinued Scott Rasmussen "Keith"'s citalopram, QUEtiapine, and amoxicillin. I am also having him start on levofloxacin and pseudoephedrine-codeine-guaifenesin. Additionally, I am having him maintain his LORazepam, famotidine, icosapent Ethyl, Wegovy, febuxostat, fluticasone, hydrocortisone, loratadine, metoprolol tartrate, omeprazole, rosuvastatin, sildenafil, Vitamin D (Ergocalciferol), buPROPion ER, finasteride, meloxicam, ROGAINE EXTRA STRENGTH, Salicylic Acid, and testosterone cypionate.  Allergies as of 12/10/2022       Reactions   Depo-medrol [methylprednisolone Sodium Succ] Rash        Medication List        Accurate as of December 10, 2022 10:02 AM. If you have any questions, ask your nurse or doctor.          STOP taking these medications    amoxicillin 500 MG capsule Commonly known as: AMOXIL Stopped by: Murdis Flitton   citalopram 20 MG tablet Commonly known as: CELEXA Stopped by: Erich Kochan   QUEtiapine 50 MG tablet Commonly known as: SEROQUEL Stopped by: Shameka Aggarwal       TAKE these medications    buPROPion ER 100 MG 12 hr tablet Commonly known as: WELLBUTRIN SR Take 1 tablet (100 mg total) by mouth 2 (two) times daily. (NEEDS TO BE SEEN BEFORE NEXT REFILL)   famotidine 20 MG tablet Commonly known as: Pepcid Take 1 tablet (20 mg total) by mouth 2 (two) times daily.   febuxostat 40 MG tablet Commonly known as: ULORIC Take 1 tablet (40 mg total) by mouth daily.   finasteride 1 MG tablet Commonly known as: PROPECIA Take 1 mg by mouth daily.   fluticasone 50 MCG/ACT nasal spray Commonly known as: FLONASE Place 2 sprays into both nostrils daily.   hydrocortisone 2.5 % rectal cream Commonly known as: Proctozone-HC Place 1 Application rectally 2 (two) times daily as needed for hemorrhoids or anal itching.   icosapent Ethyl 1 g capsule Commonly known as: Vascepa Take 2 capsules (2 g total) by mouth 2 (two) times  daily.   levofloxacin 500 MG tablet Commonly known as: LEVAQUIN Take 1 tablet (500 mg total) by mouth daily. Started by: Shanele Nissan   loratadine 10 MG tablet Commonly known as: CLARITIN Take 1 tablet (10 mg total) by mouth daily.   LORazepam 0.5 MG tablet Commonly known as: ATIVAN   meloxicam 15 MG tablet Commonly known as: MOBIC Take 15 mg by mouth daily. prn   metoprolol tartrate 25 MG tablet Commonly known as: LOPRESSOR Take 1 tablet (25 mg total) by mouth 2 (two) times daily.   omeprazole 40 MG capsule Commonly known as: PRILOSEC Take 1 capsule (40 mg total) by mouth daily.   pseudoephedrine-codeine-guaifenesin 30-10-100 MG/5ML solution Commonly known as: MYTUSSIN DAC Take  10 mLs by mouth 4 (four) times daily as needed for cough. Started by: Sameka Bagent   ROGAINE EXTRA STRENGTH 5 % Soln Generic drug: MINOXIDIL (TOPICAL) Apply topically.   rosuvastatin 10 MG tablet Commonly known as: CRESTOR Take 1 tablet (10 mg total) by mouth daily.   Salicylic Acid 6 % Crea Apply topically.   sildenafil 100 MG tablet Commonly known as: Viagra Take 0.5-1 tablets (50-100 mg total) by mouth daily as needed for erectile dysfunction.   testosterone cypionate 200 MG/ML injection Commonly known as: DEPOTESTOSTERONE CYPIONATE Inject 200 mg into the muscle once a week.   Vitamin D (Ergocalciferol) 1.25 MG (50000 UNIT) Caps capsule Commonly known as: DRISDOL TAKE 1 CAPSULE EVERY 7 DAYS   Wegovy 2.4 MG/0.75ML Soaj Generic drug: Semaglutide-Weight Management Inject 2.4 mg into the skin every 7 (seven) days.         Follow-up: Return if symptoms worsen or fail to improve.  Mechele Claude, M.D.

## 2022-12-11 LAB — NOVEL CORONAVIRUS, NAA: SARS-CoV-2, NAA: NOT DETECTED

## 2022-12-17 DIAGNOSIS — M9903 Segmental and somatic dysfunction of lumbar region: Secondary | ICD-10-CM | POA: Diagnosis not present

## 2022-12-17 DIAGNOSIS — M9901 Segmental and somatic dysfunction of cervical region: Secondary | ICD-10-CM | POA: Diagnosis not present

## 2022-12-17 DIAGNOSIS — M5033 Other cervical disc degeneration, cervicothoracic region: Secondary | ICD-10-CM | POA: Diagnosis not present

## 2022-12-17 DIAGNOSIS — M5136 Other intervertebral disc degeneration, lumbar region: Secondary | ICD-10-CM | POA: Diagnosis not present

## 2022-12-18 DIAGNOSIS — M9903 Segmental and somatic dysfunction of lumbar region: Secondary | ICD-10-CM | POA: Diagnosis not present

## 2022-12-18 DIAGNOSIS — M9901 Segmental and somatic dysfunction of cervical region: Secondary | ICD-10-CM | POA: Diagnosis not present

## 2022-12-18 DIAGNOSIS — M5033 Other cervical disc degeneration, cervicothoracic region: Secondary | ICD-10-CM | POA: Diagnosis not present

## 2022-12-18 DIAGNOSIS — M5136 Other intervertebral disc degeneration, lumbar region: Secondary | ICD-10-CM | POA: Diagnosis not present

## 2022-12-24 DIAGNOSIS — M9901 Segmental and somatic dysfunction of cervical region: Secondary | ICD-10-CM | POA: Diagnosis not present

## 2022-12-24 DIAGNOSIS — M9903 Segmental and somatic dysfunction of lumbar region: Secondary | ICD-10-CM | POA: Diagnosis not present

## 2022-12-24 DIAGNOSIS — M5136 Other intervertebral disc degeneration, lumbar region: Secondary | ICD-10-CM | POA: Diagnosis not present

## 2022-12-24 DIAGNOSIS — M5033 Other cervical disc degeneration, cervicothoracic region: Secondary | ICD-10-CM | POA: Diagnosis not present

## 2022-12-29 DIAGNOSIS — M9903 Segmental and somatic dysfunction of lumbar region: Secondary | ICD-10-CM | POA: Diagnosis not present

## 2022-12-29 DIAGNOSIS — M5033 Other cervical disc degeneration, cervicothoracic region: Secondary | ICD-10-CM | POA: Diagnosis not present

## 2022-12-29 DIAGNOSIS — M9901 Segmental and somatic dysfunction of cervical region: Secondary | ICD-10-CM | POA: Diagnosis not present

## 2022-12-29 DIAGNOSIS — M5136 Other intervertebral disc degeneration, lumbar region: Secondary | ICD-10-CM | POA: Diagnosis not present

## 2023-01-01 DIAGNOSIS — M5136 Other intervertebral disc degeneration, lumbar region: Secondary | ICD-10-CM | POA: Diagnosis not present

## 2023-01-01 DIAGNOSIS — M9901 Segmental and somatic dysfunction of cervical region: Secondary | ICD-10-CM | POA: Diagnosis not present

## 2023-01-01 DIAGNOSIS — M9903 Segmental and somatic dysfunction of lumbar region: Secondary | ICD-10-CM | POA: Diagnosis not present

## 2023-01-01 DIAGNOSIS — M5033 Other cervical disc degeneration, cervicothoracic region: Secondary | ICD-10-CM | POA: Diagnosis not present

## 2023-01-06 DIAGNOSIS — F4310 Post-traumatic stress disorder, unspecified: Secondary | ICD-10-CM | POA: Diagnosis not present

## 2023-01-08 DIAGNOSIS — M9903 Segmental and somatic dysfunction of lumbar region: Secondary | ICD-10-CM | POA: Diagnosis not present

## 2023-01-08 DIAGNOSIS — M5033 Other cervical disc degeneration, cervicothoracic region: Secondary | ICD-10-CM | POA: Diagnosis not present

## 2023-01-08 DIAGNOSIS — M9901 Segmental and somatic dysfunction of cervical region: Secondary | ICD-10-CM | POA: Diagnosis not present

## 2023-01-08 DIAGNOSIS — M5136 Other intervertebral disc degeneration, lumbar region: Secondary | ICD-10-CM | POA: Diagnosis not present

## 2023-01-09 ENCOUNTER — Telehealth: Payer: Self-pay | Admitting: Pharmacy Technician

## 2023-01-09 ENCOUNTER — Encounter: Payer: Self-pay | Admitting: Family Medicine

## 2023-01-09 DIAGNOSIS — I700 Atherosclerosis of aorta: Secondary | ICD-10-CM

## 2023-01-09 DIAGNOSIS — I2510 Atherosclerotic heart disease of native coronary artery without angina pectoris: Secondary | ICD-10-CM

## 2023-01-09 DIAGNOSIS — E781 Pure hyperglyceridemia: Secondary | ICD-10-CM

## 2023-01-09 MED ORDER — ICOSAPENT ETHYL 1 G PO CAPS: 2.0000 g | ORAL_CAPSULE | Freq: Two times a day (BID) | ORAL | 3 refills | Status: DC

## 2023-01-09 MED ORDER — WEGOVY 2.4 MG/0.75ML ~~LOC~~ SOAJ: 2.4000 mg | SUBCUTANEOUS | 3 refills | Status: DC

## 2023-01-09 NOTE — Addendum Note (Signed)
Addended by: Ignacia Bayley on: 01/09/2023 04:28 PM   Modules accepted: Orders

## 2023-01-09 NOTE — Telephone Encounter (Signed)
Pharmacy Patient Advocate Encounter   Received notification from CoverMyMeds that prior authorization for [facility] 2.4MG /0.75ML auto-injectors is required/requested.   Insurance verification completed.   The patient is insured through [facility] .   Per test claim: PA required; PA submitted to [facility] via CoverMyMeds Key/confirmation #/EOC [facility] Status is pending

## 2023-01-12 ENCOUNTER — Other Ambulatory Visit (HOSPITAL_COMMUNITY): Payer: Self-pay

## 2023-01-12 NOTE — Telephone Encounter (Signed)
PA for [facility] has been submitted and documented in separate encounter. Please see encounter from 01/09/2023 for further details. Per test claim, mega 3 ethyl esters should be covered by the insurance at no cost to pt. Please change if appropriate or advise.

## 2023-01-14 ENCOUNTER — Encounter: Payer: Self-pay | Admitting: Family Medicine

## 2023-01-15 ENCOUNTER — Other Ambulatory Visit: Payer: Self-pay | Admitting: Family Medicine

## 2023-01-15 DIAGNOSIS — Z8639 Personal history of other endocrine, nutritional and metabolic disease: Secondary | ICD-10-CM

## 2023-01-15 NOTE — Telephone Encounter (Signed)
Pharmacy Patient Advocate Encounter  Received notification from [facility] that Prior Authorization for [facility] 2.4MG /0.75ML auto-injectors  has been DENIED.  See denial reason below. No denial letter attached in CMM. Will attache denial letter to Media tab once received.   PA #/Case ID/Reference #: [id number]

## 2023-01-16 ENCOUNTER — Encounter: Payer: Self-pay | Admitting: Family Medicine

## 2023-01-16 NOTE — Telephone Encounter (Signed)
Please inform pt

## 2023-01-16 NOTE — Telephone Encounter (Signed)
Pt aware [employer] denied.

## 2023-01-20 ENCOUNTER — Encounter: Payer: Self-pay | Admitting: Family Medicine

## 2023-01-21 ENCOUNTER — Telehealth: Payer: Self-pay

## 2023-01-21 ENCOUNTER — Other Ambulatory Visit (HOSPITAL_COMMUNITY): Payer: Self-pay

## 2023-01-21 NOTE — Telephone Encounter (Signed)
Pharmacy Patient Advocate Encounter   Received notification from Patient Advice Request messages that prior authorization for Icosapent Ethyl 1GM capsules is required/requested.   Insurance verification completed.   The patient is insured through [facility] .   Per test claim: PA required; PA submitted to [facility] via CoverMyMeds Key/confirmation #/EOC [id number] Status is pending

## 2023-01-22 NOTE — Telephone Encounter (Signed)
Pharmacy Patient Advocate Encounter  Received notification from [facility] that Prior Authorization for Icosapent Ethyl 1GM capsules  has been APPROVED from 01/21/23 to 01/21/24   PA #/Case ID/Reference #: [id number]

## 2023-01-28 ENCOUNTER — Ambulatory Visit (INDEPENDENT_AMBULATORY_CARE_PROVIDER_SITE_OTHER): Payer: BC Managed Care – PPO

## 2023-01-28 DIAGNOSIS — Z23 Encounter for immunization: Secondary | ICD-10-CM

## 2023-02-02 DIAGNOSIS — M5033 Other cervical disc degeneration, cervicothoracic region: Secondary | ICD-10-CM | POA: Diagnosis not present

## 2023-02-02 DIAGNOSIS — M9901 Segmental and somatic dysfunction of cervical region: Secondary | ICD-10-CM | POA: Diagnosis not present

## 2023-02-02 DIAGNOSIS — M51362 Other intervertebral disc degeneration, lumbar region with discogenic back pain and lower extremity pain: Secondary | ICD-10-CM | POA: Diagnosis not present

## 2023-02-02 DIAGNOSIS — M9903 Segmental and somatic dysfunction of lumbar region: Secondary | ICD-10-CM | POA: Diagnosis not present

## 2023-02-12 DIAGNOSIS — M9903 Segmental and somatic dysfunction of lumbar region: Secondary | ICD-10-CM | POA: Diagnosis not present

## 2023-02-12 DIAGNOSIS — M9901 Segmental and somatic dysfunction of cervical region: Secondary | ICD-10-CM | POA: Diagnosis not present

## 2023-02-12 DIAGNOSIS — M5033 Other cervical disc degeneration, cervicothoracic region: Secondary | ICD-10-CM | POA: Diagnosis not present

## 2023-02-12 DIAGNOSIS — M51362 Other intervertebral disc degeneration, lumbar region with discogenic back pain and lower extremity pain: Secondary | ICD-10-CM | POA: Diagnosis not present

## 2023-02-16 DIAGNOSIS — L570 Actinic keratosis: Secondary | ICD-10-CM | POA: Diagnosis not present

## 2023-02-16 DIAGNOSIS — L821 Other seborrheic keratosis: Secondary | ICD-10-CM | POA: Diagnosis not present

## 2023-02-16 DIAGNOSIS — L918 Other hypertrophic disorders of the skin: Secondary | ICD-10-CM | POA: Diagnosis not present

## 2023-02-16 DIAGNOSIS — D1801 Hemangioma of skin and subcutaneous tissue: Secondary | ICD-10-CM | POA: Diagnosis not present

## 2023-02-16 DIAGNOSIS — L814 Other melanin hyperpigmentation: Secondary | ICD-10-CM | POA: Diagnosis not present

## 2023-02-17 DIAGNOSIS — F4310 Post-traumatic stress disorder, unspecified: Secondary | ICD-10-CM | POA: Diagnosis not present

## 2023-02-19 DIAGNOSIS — M9903 Segmental and somatic dysfunction of lumbar region: Secondary | ICD-10-CM | POA: Diagnosis not present

## 2023-02-19 DIAGNOSIS — M5033 Other cervical disc degeneration, cervicothoracic region: Secondary | ICD-10-CM | POA: Diagnosis not present

## 2023-02-19 DIAGNOSIS — M51362 Other intervertebral disc degeneration, lumbar region with discogenic back pain and lower extremity pain: Secondary | ICD-10-CM | POA: Diagnosis not present

## 2023-02-19 DIAGNOSIS — M9901 Segmental and somatic dysfunction of cervical region: Secondary | ICD-10-CM | POA: Diagnosis not present

## 2023-02-24 ENCOUNTER — Encounter: Payer: Self-pay | Admitting: Family Medicine

## 2023-02-26 DIAGNOSIS — M9901 Segmental and somatic dysfunction of cervical region: Secondary | ICD-10-CM | POA: Diagnosis not present

## 2023-02-26 DIAGNOSIS — M51362 Other intervertebral disc degeneration, lumbar region with discogenic back pain and lower extremity pain: Secondary | ICD-10-CM | POA: Diagnosis not present

## 2023-02-26 DIAGNOSIS — M9903 Segmental and somatic dysfunction of lumbar region: Secondary | ICD-10-CM | POA: Diagnosis not present

## 2023-02-26 DIAGNOSIS — M5033 Other cervical disc degeneration, cervicothoracic region: Secondary | ICD-10-CM | POA: Diagnosis not present

## 2023-03-11 DIAGNOSIS — M5033 Other cervical disc degeneration, cervicothoracic region: Secondary | ICD-10-CM | POA: Diagnosis not present

## 2023-03-11 DIAGNOSIS — M9901 Segmental and somatic dysfunction of cervical region: Secondary | ICD-10-CM | POA: Diagnosis not present

## 2023-03-11 DIAGNOSIS — M9903 Segmental and somatic dysfunction of lumbar region: Secondary | ICD-10-CM | POA: Diagnosis not present

## 2023-03-11 DIAGNOSIS — M51362 Other intervertebral disc degeneration, lumbar region with discogenic back pain and lower extremity pain: Secondary | ICD-10-CM | POA: Diagnosis not present

## 2023-03-16 DIAGNOSIS — M51362 Other intervertebral disc degeneration, lumbar region with discogenic back pain and lower extremity pain: Secondary | ICD-10-CM | POA: Diagnosis not present

## 2023-03-16 DIAGNOSIS — M9901 Segmental and somatic dysfunction of cervical region: Secondary | ICD-10-CM | POA: Diagnosis not present

## 2023-03-16 DIAGNOSIS — M9903 Segmental and somatic dysfunction of lumbar region: Secondary | ICD-10-CM | POA: Diagnosis not present

## 2023-03-16 DIAGNOSIS — M5033 Other cervical disc degeneration, cervicothoracic region: Secondary | ICD-10-CM | POA: Diagnosis not present

## 2023-03-24 DIAGNOSIS — F4310 Post-traumatic stress disorder, unspecified: Secondary | ICD-10-CM | POA: Diagnosis not present

## 2023-03-25 DIAGNOSIS — M5033 Other cervical disc degeneration, cervicothoracic region: Secondary | ICD-10-CM | POA: Diagnosis not present

## 2023-03-25 DIAGNOSIS — M9903 Segmental and somatic dysfunction of lumbar region: Secondary | ICD-10-CM | POA: Diagnosis not present

## 2023-03-25 DIAGNOSIS — M9901 Segmental and somatic dysfunction of cervical region: Secondary | ICD-10-CM | POA: Diagnosis not present

## 2023-03-25 DIAGNOSIS — M51362 Other intervertebral disc degeneration, lumbar region with discogenic back pain and lower extremity pain: Secondary | ICD-10-CM | POA: Diagnosis not present

## 2023-03-30 DIAGNOSIS — M9901 Segmental and somatic dysfunction of cervical region: Secondary | ICD-10-CM | POA: Diagnosis not present

## 2023-03-30 DIAGNOSIS — M5033 Other cervical disc degeneration, cervicothoracic region: Secondary | ICD-10-CM | POA: Diagnosis not present

## 2023-03-30 DIAGNOSIS — M9903 Segmental and somatic dysfunction of lumbar region: Secondary | ICD-10-CM | POA: Diagnosis not present

## 2023-03-31 ENCOUNTER — Other Ambulatory Visit: Payer: Self-pay | Admitting: Family Medicine

## 2023-03-31 DIAGNOSIS — Z8639 Personal history of other endocrine, nutritional and metabolic disease: Secondary | ICD-10-CM

## 2023-04-06 ENCOUNTER — Ambulatory Visit (INDEPENDENT_AMBULATORY_CARE_PROVIDER_SITE_OTHER): Payer: BC Managed Care – PPO | Admitting: Family Medicine

## 2023-04-06 ENCOUNTER — Encounter: Payer: Self-pay | Admitting: Family Medicine

## 2023-04-06 VITALS — BP 106/70 | HR 72 | Temp 98.3°F | Ht 70.0 in | Wt 232.0 lb

## 2023-04-06 DIAGNOSIS — D1803 Hemangioma of intra-abdominal structures: Secondary | ICD-10-CM | POA: Diagnosis not present

## 2023-04-06 DIAGNOSIS — J019 Acute sinusitis, unspecified: Secondary | ICD-10-CM | POA: Diagnosis not present

## 2023-04-06 DIAGNOSIS — K760 Fatty (change of) liver, not elsewhere classified: Secondary | ICD-10-CM

## 2023-04-06 DIAGNOSIS — N521 Erectile dysfunction due to diseases classified elsewhere: Secondary | ICD-10-CM

## 2023-04-06 DIAGNOSIS — K7689 Other specified diseases of liver: Secondary | ICD-10-CM | POA: Diagnosis not present

## 2023-04-06 DIAGNOSIS — I2510 Atherosclerotic heart disease of native coronary artery without angina pectoris: Secondary | ICD-10-CM

## 2023-04-06 DIAGNOSIS — B9689 Other specified bacterial agents as the cause of diseases classified elsewhere: Secondary | ICD-10-CM

## 2023-04-06 DIAGNOSIS — I700 Atherosclerosis of aorta: Secondary | ICD-10-CM

## 2023-04-06 MED ORDER — AMOXICILLIN-POT CLAVULANATE 875-125 MG PO TABS
1.0000 | ORAL_TABLET | Freq: Two times a day (BID) | ORAL | 0 refills | Status: DC
Start: 2023-04-06 — End: 2023-04-08

## 2023-04-06 MED ORDER — SILDENAFIL CITRATE 100 MG PO TABS: 50.0000 mg | ORAL_TABLET | Freq: Every day | ORAL | 11 refills | Status: DC | PRN

## 2023-04-06 MED ORDER — OMEPRAZOLE 40 MG PO CPDR: 40.0000 mg | DELAYED_RELEASE_CAPSULE | Freq: Every day | ORAL | 3 refills | Status: DC

## 2023-04-06 MED ORDER — ROSUVASTATIN CALCIUM 10 MG PO TABS
10.0000 mg | ORAL_TABLET | Freq: Every day | ORAL | 3 refills | Status: DC
Start: 2023-04-06 — End: 2023-04-08

## 2023-04-06 MED ORDER — METOPROLOL TARTRATE 25 MG PO TABS
25.0000 mg | ORAL_TABLET | Freq: Two times a day (BID) | ORAL | 3 refills | Status: DC
Start: 2023-04-06 — End: 2023-04-08

## 2023-04-06 MED ORDER — BUPROPION HCL ER (SR) 100 MG PO TB12: 100.0000 mg | ORAL_TABLET | Freq: Two times a day (BID) | ORAL | 3 refills | Status: DC

## 2023-04-06 MED ORDER — LORATADINE 10 MG PO TABS
10.0000 mg | ORAL_TABLET | Freq: Every day | ORAL | 3 refills | Status: DC
Start: 2023-04-06 — End: 2023-04-08

## 2023-04-06 NOTE — Progress Notes (Signed)
Subjective: CC: Liver lesion PCP: [name], DO QMV:HQIONGE Scott Rasmussen is a 58 y.o. male presenting to clinic today for:  1.  Liver lesion Patient is here because he was worried that the liver lesion that was initially found on his scans when he had COVID-19 in 2020 he needed to be followed up.  He forgot about the imaging study that he had in 2021 which revealed that the liver lesions were hemangiomas and benign appearing cyst and requires no further workup.  He was however found to have fatty liver changes.    2.  Sinusitis He reports about 3 to 4-week history of sinus headache, congestion that is refractory to Flonase, Claritin and over-the-counter products.  Reports no hemoptysis, purulence from nares.   ROS: Per HPI  Allergies  Allergen Reactions   Depo-Medrol [Methylprednisolone Sodium Succ] Rash   Past Medical History:  Diagnosis Date   Gout    Hallux rigidus 10/11/2018   right    HTN (hypertension)    Hyperlipidemia    Shortness of breath    Sleep apnea    had sx    Umbilical hernia     Current Outpatient Medications:    buPROPion ER (WELLBUTRIN SR) 100 MG 12 hr tablet, Take 1 tablet (100 mg total) by mouth 2 (two) times daily. (NEEDS TO BE SEEN BEFORE NEXT REFILL), Disp: 60 tablet, Rfl: 0   famotidine (PEPCID) 20 MG tablet, Take 1 tablet (20 mg total) by mouth 2 (two) times daily., Disp: 60 tablet, Rfl: 2   febuxostat (ULORIC) 40 MG tablet, Take 1 tablet (40 mg total) by mouth daily., Disp: 90 tablet, Rfl: 3   finasteride (PROPECIA) 1 MG tablet, Take 1 mg by mouth daily., Disp: , Rfl:    fluticasone (FLONASE) 50 MCG/ACT nasal spray, Place 2 sprays into both nostrils daily., Disp: 48 g, Rfl: 1   guaiFENesin-Codeine 200-20 MG/10ML SOLN, Take 5 mLs by mouth every 4 (four) hours as needed (cough)., Disp: 118 mL, Rfl: 0   hydrocortisone (PROCTOZONE-HC) 2.5 % rectal cream, Place 1 Application rectally 2 (two) times daily as needed for hemorrhoids or anal itching.,  Disp: 30 g, Rfl: 0   icosapent Ethyl (VASCEPA) 1 g capsule, Take 2 capsules (2 g total) by mouth 2 (two) times daily., Disp: 360 capsule, Rfl: 3   levofloxacin (LEVAQUIN) 500 MG tablet, Take 1 tablet (500 mg total) by mouth daily., Disp: 7 tablet, Rfl: 0   loratadine (CLARITIN) 10 MG tablet, Take 1 tablet (10 mg total) by mouth daily., Disp: 90 tablet, Rfl: 3   LORazepam (ATIVAN) 0.5 MG tablet, , Disp: , Rfl:    meloxicam (MOBIC) 15 MG tablet, Take 15 mg by mouth daily. prn, Disp: , Rfl:    metoprolol tartrate (LOPRESSOR) 25 MG tablet, Take 1 tablet (25 mg total) by mouth 2 (two) times daily., Disp: 180 tablet, Rfl: 3   omeprazole (PRILOSEC) 40 MG capsule, Take 1 capsule (40 mg total) by mouth daily., Disp: 90 capsule, Rfl: 3   ROGAINE EXTRA STRENGTH 5 % SOLN, Apply topically., Disp: , Rfl:    rosuvastatin (CRESTOR) 10 MG tablet, Take 1 tablet (10 mg total) by mouth daily., Disp: 90 tablet, Rfl: 3   Salicylic Acid 6 % CREA, Apply topically., Disp: , Rfl:    Semaglutide-Weight Management (WEGOVY) 2.4 MG/0.75ML SOAJ, Inject 2.4 mg into the skin every 7 (seven) days., Disp: 9 mL, Rfl: 3   sildenafil (VIAGRA) 100 MG tablet, Take 0.5-1 tablets (50-100 mg  total) by mouth daily as needed for erectile dysfunction., Disp: 5 tablet, Rfl: 11   testosterone cypionate (DEPOTESTOSTERONE CYPIONATE) 200 MG/ML injection, Inject 200 mg into the muscle once a week., Disp: , Rfl:    Vitamin D, Ergocalciferol, (DRISDOL) 1.25 MG (50000 UNIT) CAPS capsule, TAKE 1 CAPSULE BY MOUTH EVERY 7  DAYS, Disp: 13 capsule, Rfl: 3 Social History   Socioeconomic History   Marital status: Married    Spouse name: Not on file   Number of children: Not on file   Years of education: Not on file   Highest education level: Not on file  Occupational History   Not on file  Tobacco Use   Smoking status: Some Days    Current packs/day: 0.00    Average packs/day: 1 pack/day for 15.0 years (15.0 ttl pk-yrs)    Types: Cigarettes     Start date: 44    Last attempt to quit: 2012    Years since quitting: 12.9   Smokeless tobacco: Never  Vaping Use   Vaping status: Never Used  Substance and Sexual Activity   Alcohol use: Yes    Comment: 2 drinks per month per pt.   Drug use: Not Currently    Types: Marijuana   Sexual activity: Not on file  Other Topics Concern   Not on file  Social History Narrative   Works at [employer]    Social Drivers of Corporate investment banker Strain: Not on [employer] Insecurity: Not on file  Transportation Needs: Not on file  Physical Activity: Not on file  Stress: Not on file  Social Connections: Not on file  Intimate Partner Violence: Not on file   Family History  Problem Relation Age of Onset   Healthy Mother    Heart attack Father    Healthy Daughter    Healthy Son    Colon cancer Neg Hx    Esophageal cancer Neg Hx    Rectal cancer Neg Hx    Stomach cancer Neg Hx     Objective: Office vital signs reviewed. BP 106/70   Pulse 72   Temp 98.3 F (36.8 C)   Ht 5\' 10"  (1.778 m)   Wt 232 lb (105.2 kg)   SpO2 94%   BMI 33.29 kg/m   Physical Examination:  General: Awake, alert, obese, No acute distress HEENT: Sclera anicteric.  Moist mucous membranes.  TMs intact bilaterally.  No gross purulence from nares appreciated.  No facial swelling Cardio: regular rate and rhythm, S1S2 heard, no murmurs appreciated Pulm: clear to auscultation bilaterally, no wheezes, rhonchi or rales; normal work of breathing on room air  Assessment/ Plan: 57 y.o. male   Acute bacterial sinusitis - Plan: loratadine (CLARITIN) 10 MG tablet, amoxicillin-clavulanate (AUGMENTIN) 875-125 MG tablet  Fatty liver - Plan: US Abdomen Limited RUQ (LIVER/GB)  Liver hemangioma - Plan: US Abdomen Limited RUQ (LIVER/GB)  Benign liver cyst - Plan: US Abdomen Limited RUQ (LIVER/GB)  Thoracic aortic atherosclerosis (HCC) - Plan: rosuvastatin (CRESTOR) 10 MG tablet  Morbid obesity  (HCC)  Coronary artery disease involving native coronary artery of native heart without angina pectoris - Plan: metoprolol tartrate (LOPRESSOR) 25 MG tablet  Erectile dysfunction due to diseases classified elsewhere - Plan: sildenafil (VIAGRA) 100 MG tablet  Claritin renewed.  Continue Flonase.  Augmentin sent in for presumed acute bacterial sinusitis.  Follow-up if symptoms or not resolving  I discussed the benign nature of the lesions that were found on MRI and I  do not think further evaluation with MRI is warranted but because of fatty liver and ongoing obesity I think reevaluation of the fatty liver is warranted with ultrasound.  Reiterated need for ongoing lifestyle modification to reduce cardiovascular risk, particularly in the setting of CAD, thoracic aortic atherosclerosis.  I have renewed his beta-blocker, statin.  Did not discussed ED but needed refills on the sildenafil as well.  We have planned follow-up in March for annual physical with fasting labs   Ahnesti Townsend Hulen Skains, DO [facility] [phone]

## 2023-04-07 ENCOUNTER — Ambulatory Visit: Payer: BC Managed Care – PPO | Admitting: Family Medicine

## 2023-04-08 ENCOUNTER — Other Ambulatory Visit: Payer: Self-pay

## 2023-04-08 ENCOUNTER — Telehealth: Payer: Self-pay | Admitting: Family Medicine

## 2023-04-08 DIAGNOSIS — I2510 Atherosclerotic heart disease of native coronary artery without angina pectoris: Secondary | ICD-10-CM

## 2023-04-08 DIAGNOSIS — N521 Erectile dysfunction due to diseases classified elsewhere: Secondary | ICD-10-CM

## 2023-04-08 DIAGNOSIS — B9689 Other specified bacterial agents as the cause of diseases classified elsewhere: Secondary | ICD-10-CM

## 2023-04-08 DIAGNOSIS — I700 Atherosclerosis of aorta: Secondary | ICD-10-CM

## 2023-04-08 MED ORDER — SILDENAFIL CITRATE 100 MG PO TABS
50.0000 mg | ORAL_TABLET | Freq: Every day | ORAL | 11 refills | Status: AC | PRN
Start: 2023-04-08 — End: ?

## 2023-04-08 MED ORDER — ROSUVASTATIN CALCIUM 10 MG PO TABS: 10.0000 mg | ORAL_TABLET | Freq: Every day | ORAL | 3 refills | Status: AC

## 2023-04-08 MED ORDER — OMEPRAZOLE 40 MG PO CPDR: 40.0000 mg | DELAYED_RELEASE_CAPSULE | Freq: Every day | ORAL | 3 refills | Status: AC

## 2023-04-08 MED ORDER — LORATADINE 10 MG PO TABS: 10.0000 mg | ORAL_TABLET | Freq: Every day | ORAL | 3 refills | Status: AC

## 2023-04-08 MED ORDER — METOPROLOL TARTRATE 25 MG PO TABS: 25.0000 mg | ORAL_TABLET | Freq: Two times a day (BID) | ORAL | 3 refills | Status: AC

## 2023-04-08 MED ORDER — BUPROPION HCL ER (SR) 100 MG PO TB12: 100.0000 mg | ORAL_TABLET | Freq: Two times a day (BID) | ORAL | 3 refills | Status: AC

## 2023-04-08 MED ORDER — AMOXICILLIN-POT CLAVULANATE 875-125 MG PO TABS: 1.0000 | ORAL_TABLET | Freq: Two times a day (BID) | ORAL | 0 refills | Status: DC

## 2023-04-08 NOTE — Telephone Encounter (Signed)
While giving Patient [place] an appt - Patient states he needed a message placed to Nurse in regards to his Medications - Patient states they were sent to [facility] but he would like them sent into his Mail Order. Please Advise.

## 2023-04-08 NOTE — Telephone Encounter (Signed)
Meds sent

## 2023-04-09 DIAGNOSIS — M9901 Segmental and somatic dysfunction of cervical region: Secondary | ICD-10-CM | POA: Diagnosis not present

## 2023-04-09 DIAGNOSIS — M9903 Segmental and somatic dysfunction of lumbar region: Secondary | ICD-10-CM | POA: Diagnosis not present

## 2023-04-09 DIAGNOSIS — M5033 Other cervical disc degeneration, cervicothoracic region: Secondary | ICD-10-CM | POA: Diagnosis not present

## 2023-04-10 ENCOUNTER — Other Ambulatory Visit: Payer: Self-pay | Admitting: Family Medicine

## 2023-04-10 ENCOUNTER — Ambulatory Visit (HOSPITAL_COMMUNITY)
Admission: RE | Admit: 2023-04-10 | Discharge: 2023-04-10 | Disposition: A | Discharge status: Home / Self Care | Payer: BC Managed Care – PPO | Source: Ambulatory Visit | Attending: Family Medicine | Admitting: Family Medicine

## 2023-04-10 DIAGNOSIS — K760 Fatty (change of) liver, not elsewhere classified: Secondary | ICD-10-CM | POA: Diagnosis not present

## 2023-04-10 DIAGNOSIS — D1803 Hemangioma of intra-abdominal structures: Secondary | ICD-10-CM | POA: Insufficient documentation

## 2023-04-10 DIAGNOSIS — K7689 Other specified diseases of liver: Secondary | ICD-10-CM | POA: Insufficient documentation

## 2023-04-20 DIAGNOSIS — M5033 Other cervical disc degeneration, cervicothoracic region: Secondary | ICD-10-CM | POA: Diagnosis not present

## 2023-04-20 DIAGNOSIS — M9901 Segmental and somatic dysfunction of cervical region: Secondary | ICD-10-CM | POA: Diagnosis not present

## 2023-04-20 DIAGNOSIS — M9903 Segmental and somatic dysfunction of lumbar region: Secondary | ICD-10-CM | POA: Diagnosis not present

## 2023-04-21 DIAGNOSIS — G4733 Obstructive sleep apnea (adult) (pediatric): Secondary | ICD-10-CM | POA: Diagnosis not present

## 2023-04-27 DIAGNOSIS — M9901 Segmental and somatic dysfunction of cervical region: Secondary | ICD-10-CM | POA: Diagnosis not present

## 2023-04-27 DIAGNOSIS — M9903 Segmental and somatic dysfunction of lumbar region: Secondary | ICD-10-CM | POA: Diagnosis not present

## 2023-04-27 DIAGNOSIS — M5033 Other cervical disc degeneration, cervicothoracic region: Secondary | ICD-10-CM | POA: Diagnosis not present

## 2023-04-29 DIAGNOSIS — M9903 Segmental and somatic dysfunction of lumbar region: Secondary | ICD-10-CM | POA: Diagnosis not present

## 2023-04-29 DIAGNOSIS — M9901 Segmental and somatic dysfunction of cervical region: Secondary | ICD-10-CM | POA: Diagnosis not present

## 2023-04-29 DIAGNOSIS — M5033 Other cervical disc degeneration, cervicothoracic region: Secondary | ICD-10-CM | POA: Diagnosis not present

## 2023-05-11 DIAGNOSIS — M9901 Segmental and somatic dysfunction of cervical region: Secondary | ICD-10-CM | POA: Diagnosis not present

## 2023-05-11 DIAGNOSIS — M5033 Other cervical disc degeneration, cervicothoracic region: Secondary | ICD-10-CM | POA: Diagnosis not present

## 2023-05-11 DIAGNOSIS — M9903 Segmental and somatic dysfunction of lumbar region: Secondary | ICD-10-CM | POA: Diagnosis not present

## 2023-05-11 DIAGNOSIS — M51362 Other intervertebral disc degeneration, lumbar region with discogenic back pain and lower extremity pain: Secondary | ICD-10-CM | POA: Diagnosis not present

## 2023-05-12 DIAGNOSIS — F4310 Post-traumatic stress disorder, unspecified: Secondary | ICD-10-CM | POA: Diagnosis not present

## 2023-05-21 DIAGNOSIS — M9903 Segmental and somatic dysfunction of lumbar region: Secondary | ICD-10-CM | POA: Diagnosis not present

## 2023-05-21 DIAGNOSIS — M5033 Other cervical disc degeneration, cervicothoracic region: Secondary | ICD-10-CM | POA: Diagnosis not present

## 2023-05-21 DIAGNOSIS — M9901 Segmental and somatic dysfunction of cervical region: Secondary | ICD-10-CM | POA: Diagnosis not present

## 2023-05-21 DIAGNOSIS — M51362 Other intervertebral disc degeneration, lumbar region with discogenic back pain and lower extremity pain: Secondary | ICD-10-CM | POA: Diagnosis not present

## 2023-05-25 DIAGNOSIS — M51362 Other intervertebral disc degeneration, lumbar region with discogenic back pain and lower extremity pain: Secondary | ICD-10-CM | POA: Diagnosis not present

## 2023-05-25 DIAGNOSIS — M5033 Other cervical disc degeneration, cervicothoracic region: Secondary | ICD-10-CM | POA: Diagnosis not present

## 2023-05-25 DIAGNOSIS — M9901 Segmental and somatic dysfunction of cervical region: Secondary | ICD-10-CM | POA: Diagnosis not present

## 2023-05-25 DIAGNOSIS — M9903 Segmental and somatic dysfunction of lumbar region: Secondary | ICD-10-CM | POA: Diagnosis not present

## 2023-06-03 DIAGNOSIS — M9903 Segmental and somatic dysfunction of lumbar region: Secondary | ICD-10-CM | POA: Diagnosis not present

## 2023-06-03 DIAGNOSIS — M5033 Other cervical disc degeneration, cervicothoracic region: Secondary | ICD-10-CM | POA: Diagnosis not present

## 2023-06-03 DIAGNOSIS — M9901 Segmental and somatic dysfunction of cervical region: Secondary | ICD-10-CM | POA: Diagnosis not present

## 2023-06-03 DIAGNOSIS — M51362 Other intervertebral disc degeneration, lumbar region with discogenic back pain and lower extremity pain: Secondary | ICD-10-CM | POA: Diagnosis not present

## 2023-06-23 DIAGNOSIS — F4310 Post-traumatic stress disorder, unspecified: Secondary | ICD-10-CM | POA: Diagnosis not present

## 2023-06-25 DIAGNOSIS — M5033 Other cervical disc degeneration, cervicothoracic region: Secondary | ICD-10-CM | POA: Diagnosis not present

## 2023-06-25 DIAGNOSIS — M9903 Segmental and somatic dysfunction of lumbar region: Secondary | ICD-10-CM | POA: Diagnosis not present

## 2023-06-25 DIAGNOSIS — M51362 Other intervertebral disc degeneration, lumbar region with discogenic back pain and lower extremity pain: Secondary | ICD-10-CM | POA: Diagnosis not present

## 2023-06-25 DIAGNOSIS — M9901 Segmental and somatic dysfunction of cervical region: Secondary | ICD-10-CM | POA: Diagnosis not present

## 2023-06-26 DIAGNOSIS — M1611 Unilateral primary osteoarthritis, right hip: Secondary | ICD-10-CM | POA: Diagnosis not present

## 2023-06-26 DIAGNOSIS — M545 Low back pain, unspecified: Secondary | ICD-10-CM | POA: Diagnosis not present

## 2023-06-26 DIAGNOSIS — M7061 Trochanteric bursitis, right hip: Secondary | ICD-10-CM | POA: Diagnosis not present

## 2023-06-29 DIAGNOSIS — M9903 Segmental and somatic dysfunction of lumbar region: Secondary | ICD-10-CM | POA: Diagnosis not present

## 2023-06-29 DIAGNOSIS — M51362 Other intervertebral disc degeneration, lumbar region with discogenic back pain and lower extremity pain: Secondary | ICD-10-CM | POA: Diagnosis not present

## 2023-06-29 DIAGNOSIS — M5033 Other cervical disc degeneration, cervicothoracic region: Secondary | ICD-10-CM | POA: Diagnosis not present

## 2023-06-29 DIAGNOSIS — M9901 Segmental and somatic dysfunction of cervical region: Secondary | ICD-10-CM | POA: Diagnosis not present

## 2023-07-15 ENCOUNTER — Encounter: Payer: Self-pay | Admitting: Family Medicine

## 2023-07-15 ENCOUNTER — Ambulatory Visit: Payer: BC Managed Care – PPO | Admitting: Family Medicine

## 2023-07-15 VITALS — BP 129/75 | HR 95 | Temp 98.2°F | Ht 70.0 in | Wt 226.8 lb

## 2023-07-15 DIAGNOSIS — Z0000 Encounter for general adult medical examination without abnormal findings: Secondary | ICD-10-CM

## 2023-07-15 DIAGNOSIS — E66811 Obesity, class 1: Secondary | ICD-10-CM

## 2023-07-15 DIAGNOSIS — Z0001 Encounter for general adult medical examination with abnormal findings: Secondary | ICD-10-CM

## 2023-07-15 DIAGNOSIS — K760 Fatty (change of) liver, not elsewhere classified: Secondary | ICD-10-CM

## 2023-07-15 DIAGNOSIS — G4730 Sleep apnea, unspecified: Secondary | ICD-10-CM

## 2023-07-15 DIAGNOSIS — F418 Other specified anxiety disorders: Secondary | ICD-10-CM

## 2023-07-15 DIAGNOSIS — Z23 Encounter for immunization: Secondary | ICD-10-CM | POA: Diagnosis not present

## 2023-07-15 DIAGNOSIS — I2510 Atherosclerotic heart disease of native coronary artery without angina pectoris: Secondary | ICD-10-CM

## 2023-07-15 DIAGNOSIS — D582 Other hemoglobinopathies: Secondary | ICD-10-CM | POA: Diagnosis not present

## 2023-07-15 DIAGNOSIS — Z720 Tobacco use: Secondary | ICD-10-CM | POA: Diagnosis not present

## 2023-07-15 DIAGNOSIS — Z125 Encounter for screening for malignant neoplasm of prostate: Secondary | ICD-10-CM

## 2023-07-15 DIAGNOSIS — R718 Other abnormality of red blood cells: Secondary | ICD-10-CM | POA: Insufficient documentation

## 2023-07-15 DIAGNOSIS — Z6832 Body mass index (BMI) 32.0-32.9, adult: Secondary | ICD-10-CM

## 2023-07-15 DIAGNOSIS — I700 Atherosclerosis of aorta: Secondary | ICD-10-CM

## 2023-07-15 MED ORDER — DESVENLAFAXINE SUCCINATE ER 50 MG PO TB24: 50.0000 mg | ORAL_TABLET | Freq: Every day | ORAL | 1 refills | Status: DC

## 2023-07-15 MED ORDER — ZEPBOUND 2.5 MG/0.5ML ~~LOC~~ SOAJ: 2.5000 mg | SUBCUTANEOUS | 0 refills | Status: DC

## 2023-07-15 MED ORDER — ZEPBOUND 5 MG/0.5ML ~~LOC~~ SOAJ: 5.0000 mg | SUBCUTANEOUS | 0 refills | Status: DC

## 2023-07-15 MED ORDER — ICOSAPENT ETHYL 1 G PO CAPS: 2.0000 g | ORAL_CAPSULE | Freq: Two times a day (BID) | ORAL | 3 refills | Status: AC

## 2023-07-15 MED ORDER — ZEPBOUND 7.5 MG/0.5ML ~~LOC~~ SOAJ: 7.5000 mg | SUBCUTANEOUS | 0 refills | Status: DC

## 2023-07-15 NOTE — Progress Notes (Signed)
 Scott Rasmussen is a 58 y.o. male presents to office today for annual physical exam examination.    Concerns today include: 1.  Anxiety and depression Patient is compliant with Wellbutrin.  He was on as needed Ativan but notes that he stopped seeing Dr. Edwin Dada because she wanted him to be seen every month and he did not feel like he needed that.  However, as of late he does feel like he is having more anxiety symptoms and his counselor thought that maybe he needed to go back on some type of medication.  He is previously been on Prozac, Lexapro but did not feel like these were helpful to him.  He continues to see his counselor every month however.  He is also interested in trying to stop smoking again with Chantix but is not sure if he can utilize this with Wellbutrin  2.  Obesity associated with severe obstructive sleep apnea, coronary artery disease Patient is under the care of Dr. Bufford Buttner for cardiology.  He reports he sees them yearly.  He is compliant with his CPAP machine.  He was previously treated with [facility] and that seemed to help him but sadly his insurance would no longer cover and it would be several thousand dollars for him to continue medicine on his own.  He is interested in trying to revisit this medication if possible.  He has been off of all GLP since January.  He is also been off of testosterone for several months because he had elevations in his hemoglobin hematocrit.  This has been a chronic issue for him and he was seen by Dr. Cyndie Chime many years ago.  His testosterone specialist recommended that he have therapeutic phlebotomy more frequently than every 3 months if possible.  He wants to know how this can be set up.  Marital status: married, Substance use: tobacco Health Maintenance Due  Topic Date Due   COVID-19 Vaccine (4 - 2024-25 season) 12/21/2022   Refills needed today: none  Immunization History  Administered Date(s) Administered   Influenza, Seasonal, Injecte,  Preservative Fre 01/28/2023   Influenza,inj,Quad PF,6+ Mos 02/21/2013, 01/16/2014, 01/30/2015, 03/03/2016, 01/27/2017, 02/16/2018, 01/07/2019, 01/11/2020, 03/27/2021, 01/14/2022   PFIZER(Purple Top)SARS-COV-2 Vaccination 07/14/2019, 08/08/2019, 05/02/2020   PNEUMOCOCCAL CONJUGATE-20 07/15/2023   Tdap 11/16/2012, 07/15/2023   Zoster Recombinant(Shingrix) 03/22/2018, 06/14/2018   Past Medical History:  Diagnosis Date   Gout    Hallux rigidus 10/11/2018   right    HTN (hypertension)    Hyperlipidemia    Shortness of breath    Sleep apnea    had sx    Umbilical hernia    Social History   Socioeconomic History   Marital status: Married    Spouse name: Not on file   Number of children: Not on file   Years of education: Not on file   Highest education level: Some college, no degree  Occupational History   Not on file  Tobacco Use   Smoking status: Some Days    Current packs/day: 0.00    Average packs/day: 1 pack/day for 15.0 years (15.0 ttl pk-yrs)    Types: Cigarettes    Start date: 1997    Last attempt to quit: 2012    Years since quitting: 13.2   Smokeless tobacco: Never  Vaping Use   Vaping status: Never Used  Substance and Sexual Activity   Alcohol use: Yes    Comment: 2 drinks per month per pt.   Drug use: Not Currently    Types:  Marijuana   Sexual activity: Not on file  Other Topics Concern   Not on file  Social History Narrative   Works at [employer]    Social Drivers of [employer]: Low Risk  (07/14/2023)   Overall Financial Resource Strain (CARDIA)    Difficulty of Paying Living Expenses: Not hard at all  Food Insecurity: No Food Insecurity (07/14/2023)   Hunger Vital Sign    Worried About Running Out of Food in the Last Year: Never true    Ran Out of Food in the Last Year: Never true  Transportation Needs: No Transportation Needs (07/14/2023)   PRAPARE - Administrator, Civil Service (Medical): No    Lack of  Transportation (Non-Medical): No  Physical Activity: Insufficiently Active (07/14/2023)   Exercise Vital Sign    Days of Exercise per Week: 4 days    Minutes of Exercise per Session: 20 min  Stress: No Stress Concern Present (07/14/2023)   [employer] of Occupational Health - Occupational Stress Questionnaire    Feeling of Stress : Only a little  Social Connections: Moderately Integrated (07/14/2023)   Social Connection and Isolation Panel [NHANES]    Frequency of Communication with Friends and Family: More than three times a week    Frequency of Social Gatherings with Friends and Family: Once a week    Attends Religious Services: More than 4 times per year    Active Member of [employer] or Organizations: No    Attends Engineer, structural: Not on file    Marital Status: Married  Catering manager Violence: Not on file   Past Surgical History:  Procedure Laterality Date   ROTATOR CUFF REPAIR     TONSILLECTOMY AND ADENOIDECTOMY  2008   UVULOPALATOPHARYNGOPLASTY  2008   Family History  Problem Relation Age of Onset   Healthy Mother    Heart attack Father    Healthy Daughter    Healthy Son    Colon cancer Neg Hx    Esophageal cancer Neg Hx    Rectal cancer Neg Hx    Stomach cancer Neg Hx     Current Outpatient Medications:    buPROPion ER (WELLBUTRIN SR) 100 MG 12 hr tablet, Take 1 tablet (100 mg total) by mouth 2 (two) times daily., Disp: 180 tablet, Rfl: 3   desvenlafaxine (PRISTIQ) 50 MG 24 hr tablet, Take 1 tablet (50 mg total) by mouth daily., Disp: 30 tablet, Rfl: 1   diclofenac (VOLTAREN) 75 MG EC tablet, , Disp: , Rfl:    famotidine (PEPCID) 20 MG tablet, Take 1 tablet (20 mg total) by mouth 2 (two) times daily., Disp: 60 tablet, Rfl: 2   febuxostat (ULORIC) 40 MG tablet, TAKE 1 TABLET BY MOUTH DAILY, Disp: 90 tablet, Rfl: 0   finasteride (PROPECIA) 1 MG tablet, Take 1 mg by mouth daily., Disp: , Rfl:    fluticasone (FLONASE) 50 MCG/ACT nasal spray, Place 2  sprays into both nostrils daily., Disp: 48 g, Rfl: 1   hydrocortisone (PROCTOZONE-HC) 2.5 % rectal cream, Place 1 Application rectally 2 (two) times daily as needed for hemorrhoids or anal itching., Disp: 30 g, Rfl: 0   levofloxacin (LEVAQUIN) 500 MG tablet, Take 1 tablet (500 mg total) by mouth daily., Disp: 7 tablet, Rfl: 0   loratadine (CLARITIN) 10 MG tablet, Take 1 tablet (10 mg total) by mouth daily., Disp: 90 tablet, Rfl: 3   LORazepam (ATIVAN) 0.5 MG tablet, , Disp: , Rfl:  meloxicam (MOBIC) 15 MG tablet, Take 15 mg by mouth daily. prn, Disp: , Rfl:    metoprolol tartrate (LOPRESSOR) 25 MG tablet, Take 1 tablet (25 mg total) by mouth 2 (two) times daily., Disp: 180 tablet, Rfl: 3   omeprazole (PRILOSEC) 40 MG capsule, Take 1 capsule (40 mg total) by mouth daily., Disp: 90 capsule, Rfl: 3   ROGAINE EXTRA STRENGTH 5 % SOLN, Apply topically., Disp: , Rfl:    rosuvastatin (CRESTOR) 10 MG tablet, Take 1 tablet (10 mg total) by mouth daily., Disp: 90 tablet, Rfl: 3   Salicylic Acid 6 % CREA, Apply topically., Disp: , Rfl:    Semaglutide-Weight Management (WEGOVY) 2.4 MG/0.75ML SOAJ, Inject 2.4 mg into the skin every 7 (seven) days., Disp: 9 mL, Rfl: 3   sildenafil (VIAGRA) 100 MG tablet, Take 0.5-1 tablets (50-100 mg total) by mouth daily as needed for erectile dysfunction., Disp: 5 tablet, Rfl: 11   testosterone cypionate (DEPOTESTOSTERONE CYPIONATE) 200 MG/ML injection, Inject 200 mg into the muscle once a week., Disp: , Rfl:    Vitamin D, Ergocalciferol, (DRISDOL) 1.25 MG (50000 UNIT) CAPS capsule, TAKE 1 CAPSULE BY MOUTH EVERY 7  DAYS, Disp: 13 capsule, Rfl: 3   icosapent Ethyl (VASCEPA) 1 g capsule, Take 2 capsules (2 g total) by mouth 2 (two) times daily., Disp: 360 capsule, Rfl: 3  Allergies  Allergen Reactions   Depo-Medrol [Methylprednisolone Sodium Succ] Rash     ROS: Review of Systems Pertinent items noted in HPI and remainder of comprehensive ROS otherwise negative.     Physical exam BP 129/75   Pulse 95   Temp 98.2 F (36.8 C)   Ht 5\' 10"  (1.778 m)   Wt 226 lb 12.8 oz (102.9 kg)   SpO2 93%   BMI 32.54 kg/m  General appearance: alert, cooperative, appears stated age, no distress, and moderately obese Head: Normocephalic, without obvious abnormality, atraumatic Eyes: negative findings: lids and lashes normal, conjunctivae and sclerae normal, corneas clear, and pupils equal, round, reactive to light and accomodation Ears: normal TM's and external ear canals both ears Nose: Nares normal. Septum midline. Mucosa normal. No drainage or sinus tenderness. Throat: lips, mucosa, and tongue normal; teeth and gums normal Neck: no adenopathy, supple, symmetrical, trachea midline, and thyroid not enlarged, symmetric, no tenderness/mass/nodules Back: symmetric, no curvature. ROM normal. No CVA tenderness. Lungs: clear to auscultation bilaterally Chest wall: no tenderness Heart: regular rate and rhythm, S1, S2 normal, no murmur, click, rub or gallop Abdomen: soft, non-tender; bowel sounds normal; no masses,  no organomegaly and protuberant Extremities: extremities normal, atraumatic, no cyanosis or edema Pulses: 2+ and symmetric Skin: Skin color, texture, turgor normal. No rashes or lesions Lymph nodes: Cervical, supraclavicular, and axillary nodes normal. Neurologic: Grossly normal      07/15/2023    2:00 PM 12/10/2022    9:43 AM 04/11/2022    9:12 AM  Depression screen PHQ 2/9  Decreased Interest 0 1 2  Down, Depressed, Hopeless 0 0 2  PHQ - 2 Score 0 1 4  Altered sleeping 0 1 2  Tired, decreased energy 0 1 3  Change in appetite 1 1 0  Feeling bad or failure about yourself  1 0 0  Trouble concentrating 0 0 2  Moving slowly or fidgety/restless 0 0 0  Suicidal thoughts 0 0 0  PHQ-9 Score 2 4 11   Difficult doing work/chores Not difficult at all Not difficult at all Extremely dIfficult      07/15/2023  1:59 PM 12/10/2022    9:43 AM 04/11/2022     9:13 AM 01/14/2022    2:24 PM  GAD 7 : Generalized Anxiety Score  Nervous, Anxious, on Edge 0 0 2 2  Control/stop worrying 0 0 3 3  Worry too much - different things 0 0 3 3  Trouble relaxing 0 0 2 2  Restless 0 0 0 1  Easily annoyed or irritable 0 1 3 2   Afraid - awful might happen 0 0 2 0  Total GAD 7 Score 0 1 15 13   Anxiety Difficulty Not difficult at all Not difficult at all Extremely difficult Somewhat difficult     Assessment/ Plan: [name] here for annual physical exam.   Annual physical exam - Plan: Pneumococcal conjugate vaccine 20-valent (Prevnar 20), Tdap vaccine greater than or equal to 7yo IM  Anxiety with depression - Plan: desvenlafaxine (PRISTIQ) 50 MG 24 hr tablet  Elevated hemoglobin (HCC) - Plan: Ambulatory referral to Hematology / Oncology  Elevated hematocrit - Plan: Ambulatory referral to Hematology / Oncology  Tobacco use  Severe sleep apnea - Plan: tirzepatide (ZEPBOUND) 5 MG/0.5ML Pen, tirzepatide (ZEPBOUND) 2.5 MG/0.5ML Pen, tirzepatide (ZEPBOUND) 7.5 MG/0.5ML Pen  Class 1 obesity due to excess calories with serious comorbidity and body mass index (BMI) of 32.0 to 32.9 in adult - Plan: tirzepatide (ZEPBOUND) 5 MG/0.5ML Pen, tirzepatide (ZEPBOUND) 2.5 MG/0.5ML Pen, tirzepatide (ZEPBOUND) 7.5 MG/0.5ML Pen  Coronary artery disease involving native coronary artery of native heart without angina pectoris - Plan: CMP14+EGFR, Lipid panel, tirzepatide (ZEPBOUND) 5 MG/0.5ML Pen, tirzepatide (ZEPBOUND) 2.5 MG/0.5ML Pen, tirzepatide (ZEPBOUND) 7.5 MG/0.5ML Pen  Thoracic aortic atherosclerosis (HCC) - Plan: CMP14+EGFR, Lipid panel, TSH, icosapent Ethyl (VASCEPA) 1 g capsule, tirzepatide (ZEPBOUND) 5 MG/0.5ML Pen, tirzepatide (ZEPBOUND) 2.5 MG/0.5ML Pen, tirzepatide (ZEPBOUND) 7.5 MG/0.5ML Pen  Fatty liver - Plan: CMP14+EGFR, CBC, tirzepatide (ZEPBOUND) 5 MG/0.5ML Pen, tirzepatide (ZEPBOUND) 2.5 MG/0.5ML Pen, tirzepatide (ZEPBOUND) 7.5 MG/0.5ML  Pen  Screening for malignant neoplasm of prostate - Plan: PSA  He was given both his tetanus and pneumococcal vaccinations today.  He is otherwise up-to-date on preventative health care.  Fasting labs ordered and he will return tomorrow to have these collected.  I reviewed the labs that were available in the central database for [facility] but I did not see a recent PSA.  I did also like to follow-up on his CBC so this was ordered.  Referral to hematology to reestablish care and discuss maybe more frequent therapeutic phlebotomy versus etiology of why he has had this elevation in hemoglobin hematocrit.  He has been on and off smoker and I am going to see if perhaps we can prescribe Chantix in addition to the Wellbutrin though he may need to discontinue 1 in order to start the other.  I will consult with clinical pharmacy  Regarding his anxiety and depression, I do not think that continued use of a benzodiazepine would be ideal for this patient and for this reason I am going to place him on Pristiq to see if we might be of improve chronic pain issues along with mood.  I will see him back again in about 6 weeks, sooner if concerns arise  With regards to his weight, CAD etc. I have ordered Zepbound as it has a new indication for obstructive sleep apnea and on his last polysomnogram he was found to have at least moderate to severe sleep apnea depending on which position he was lying in.  He is compliant with  CPAP machine and BMI is 32 so he should qualify.  If for what ever reason his insurance is not covering I can try resubmitting for the [facility] with indication of CAD.  Counseled on healthy lifestyle choices, including diet (rich in fruits, vegetables and lean meats and low in salt and simple carbohydrates) and exercise (at least 30 minutes of moderate physical activity daily).  Patient to follow up 6 weeks  Adamary Savary M. Nadine Counts, DO

## 2023-07-16 ENCOUNTER — Other Ambulatory Visit

## 2023-07-16 DIAGNOSIS — I700 Atherosclerosis of aorta: Secondary | ICD-10-CM

## 2023-07-16 DIAGNOSIS — R300 Dysuria: Secondary | ICD-10-CM | POA: Diagnosis not present

## 2023-07-16 DIAGNOSIS — I2510 Atherosclerotic heart disease of native coronary artery without angina pectoris: Secondary | ICD-10-CM | POA: Diagnosis not present

## 2023-07-16 DIAGNOSIS — K760 Fatty (change of) liver, not elsewhere classified: Secondary | ICD-10-CM | POA: Diagnosis not present

## 2023-07-16 DIAGNOSIS — Z125 Encounter for screening for malignant neoplasm of prostate: Secondary | ICD-10-CM

## 2023-07-16 LAB — URINALYSIS, ROUTINE W REFLEX MICROSCOPIC
Bilirubin, UA: NEGATIVE
Glucose, UA: NEGATIVE
Ketones, UA: NEGATIVE
Leukocytes,UA: NEGATIVE
Nitrite, UA: NEGATIVE
Protein,UA: NEGATIVE
RBC, UA: NEGATIVE
Specific Gravity, UA: 1.015 (ref 1.005–1.030)
Urobilinogen, Ur: 0.2 mg/dL (ref 0.2–1.0)
pH, UA: 6 (ref 5.0–7.5)

## 2023-07-16 LAB — LIPID PANEL

## 2023-07-17 ENCOUNTER — Encounter: Payer: Self-pay | Admitting: Family Medicine

## 2023-07-17 LAB — CMP14+EGFR
ALT: 44 IU/L (ref 0–44)
AST: 28 IU/L (ref 0–40)
Albumin: 4.8 g/dL (ref 3.8–4.9)
Alkaline Phosphatase: 77 IU/L (ref 44–121)
BUN/Creatinine Ratio: 16 (ref 9–20)
BUN: 18 mg/dL (ref 6–24)
Bilirubin Total: 0.5 mg/dL (ref 0.0–1.2)
CO2: 24 mmol/L (ref 20–29)
Calcium: 10.3 mg/dL — ABNORMAL HIGH (ref 8.7–10.2)
Chloride: 100 mmol/L (ref 96–106)
Creatinine, Ser: 1.1 mg/dL (ref 0.76–1.27)
Globulin, Total: 2.4 g/dL (ref 1.5–4.5)
Glucose: 93 mg/dL (ref 70–99)
Potassium: 4.7 mmol/L (ref 3.5–5.2)
Sodium: 139 mmol/L (ref 134–144)
Total Protein: 7.2 g/dL (ref 6.0–8.5)
eGFR: 79 mL/min/{1.73_m2} (ref >59)

## 2023-07-17 LAB — CBC
Hematocrit: 53.1 % — ABNORMAL HIGH (ref 37.5–51.0)
Hemoglobin: 17.7 g/dL (ref 13.0–17.7)
MCH: 29.6 pg (ref 26.6–33.0)
MCHC: 33.3 g/dL (ref 31.5–35.7)
MCV: 89 fL (ref 79–97)
Platelets: 281 10*3/uL (ref 150–450)
RBC: 5.98 x10E6/uL — ABNORMAL HIGH (ref 4.14–5.80)
RDW: 12.2 % (ref 11.6–15.4)
WBC: 7.3 10*3/uL (ref 3.4–10.8)

## 2023-07-17 LAB — LIPID PANEL
Cholesterol, Total: 147 mg/dL (ref 100–199)
HDL: 49 mg/dL (ref >39)
LDL CALC COMMENT:: 3 ratio (ref 0.0–5.0)
LDL Chol Calc (NIH): 68 mg/dL (ref 0–99)
Triglycerides: 179 mg/dL — ABNORMAL HIGH (ref 0–149)
VLDL Cholesterol Cal: 30 mg/dL (ref 5–40)

## 2023-07-17 LAB — TSH: TSH: 1.79 u[IU]/mL (ref 0.450–4.500)

## 2023-07-17 LAB — PSA: Prostate Specific Ag, Serum: 0.1 ng/mL (ref 0.0–4.0)

## 2023-07-17 NOTE — Telephone Encounter (Signed)
 Can you please reroute referral to this provider?

## 2023-07-18 LAB — URINE CULTURE: Organism ID, Bacteria: NO GROWTH

## 2023-07-20 DIAGNOSIS — M51362 Other intervertebral disc degeneration, lumbar region with discogenic back pain and lower extremity pain: Secondary | ICD-10-CM | POA: Diagnosis not present

## 2023-07-20 DIAGNOSIS — M5033 Other cervical disc degeneration, cervicothoracic region: Secondary | ICD-10-CM | POA: Diagnosis not present

## 2023-07-20 DIAGNOSIS — M9903 Segmental and somatic dysfunction of lumbar region: Secondary | ICD-10-CM | POA: Diagnosis not present

## 2023-07-20 DIAGNOSIS — M9901 Segmental and somatic dysfunction of cervical region: Secondary | ICD-10-CM | POA: Diagnosis not present

## 2023-07-23 DIAGNOSIS — F4310 Post-traumatic stress disorder, unspecified: Secondary | ICD-10-CM | POA: Diagnosis not present

## 2023-08-03 DIAGNOSIS — M51362 Other intervertebral disc degeneration, lumbar region with discogenic back pain and lower extremity pain: Secondary | ICD-10-CM | POA: Diagnosis not present

## 2023-08-03 DIAGNOSIS — M9901 Segmental and somatic dysfunction of cervical region: Secondary | ICD-10-CM | POA: Diagnosis not present

## 2023-08-03 DIAGNOSIS — M9903 Segmental and somatic dysfunction of lumbar region: Secondary | ICD-10-CM | POA: Diagnosis not present

## 2023-08-03 DIAGNOSIS — M5033 Other cervical disc degeneration, cervicothoracic region: Secondary | ICD-10-CM | POA: Diagnosis not present

## 2023-08-06 ENCOUNTER — Encounter: Payer: Self-pay | Admitting: Family Medicine

## 2023-08-06 DIAGNOSIS — F418 Other specified anxiety disorders: Secondary | ICD-10-CM

## 2023-08-07 ENCOUNTER — Telehealth: Payer: Self-pay

## 2023-08-07 NOTE — Telephone Encounter (Signed)
 Pharmacy Patient Advocate Encounter   Received notification from CoverMyMeds that prior authorization for  Zepbound  2.5MG /0.5ML pen-injectors is required/requested.   Insurance verification completed.   The patient is insured through [facility] .   Per test claim: PA required; PA submitted to above mentioned insurance via CoverMyMeds Key/confirmation #/EOC [id number] Status is pending

## 2023-08-10 MED ORDER — DESVENLAFAXINE SUCCINATE ER 50 MG PO TB24: 50.0000 mg | ORAL_TABLET | Freq: Every day | ORAL | 3 refills | Status: AC

## 2023-08-17 ENCOUNTER — Other Ambulatory Visit: Payer: Self-pay | Admitting: Family Medicine

## 2023-08-17 DIAGNOSIS — G4730 Sleep apnea, unspecified: Secondary | ICD-10-CM

## 2023-08-17 DIAGNOSIS — E66811 Obesity, class 1: Secondary | ICD-10-CM

## 2023-08-17 DIAGNOSIS — K760 Fatty (change of) liver, not elsewhere classified: Secondary | ICD-10-CM

## 2023-08-17 DIAGNOSIS — I700 Atherosclerosis of aorta: Secondary | ICD-10-CM

## 2023-08-17 DIAGNOSIS — I2510 Atherosclerotic heart disease of native coronary artery without angina pectoris: Secondary | ICD-10-CM

## 2023-08-17 MED ORDER — ZEPBOUND 2.5 MG/0.5ML ~~LOC~~ SOAJ: 2.5000 mg | SUBCUTANEOUS | 0 refills | Status: DC

## 2023-08-17 MED ORDER — ZEPBOUND 7.5 MG/0.5ML ~~LOC~~ SOAJ: 7.5000 mg | SUBCUTANEOUS | 0 refills | Status: DC

## 2023-08-17 MED ORDER — ZEPBOUND 5 MG/0.5ML ~~LOC~~ SOAJ: 5.0000 mg | SUBCUTANEOUS | 0 refills | Status: DC

## 2023-08-18 NOTE — Telephone Encounter (Signed)
 Yes, reach out to PA team. My note is very detailed. Not sure what the hold up is.

## 2023-08-19 ENCOUNTER — Other Ambulatory Visit (HOSPITAL_COMMUNITY): Payer: Self-pay

## 2023-08-19 NOTE — Telephone Encounter (Signed)
 Pharmacy Patient Advocate Encounter  Received notification from [employer] that Prior Authorization for Zepbound  2.5mg /0.110ml has been DENIED.  See denial reason below. No denial letter attached in CMM. Will attach denial letter to Media tab once received. Zepbound  is a plan exclusion.    PA #/Case ID/Reference #: [id number]

## 2023-08-19 NOTE — Telephone Encounter (Signed)
 Pcp already aware and taken care of this

## 2023-08-24 ENCOUNTER — Encounter: Payer: Self-pay | Admitting: Oncology

## 2023-08-24 ENCOUNTER — Telehealth: Payer: Self-pay | Admitting: Oncology

## 2023-08-24 ENCOUNTER — Inpatient Hospital Stay: Attending: Oncology | Admitting: Oncology

## 2023-08-24 ENCOUNTER — Inpatient Hospital Stay

## 2023-08-24 VITALS — BP 134/90 | HR 80 | Temp 98.7°F | Resp 18 | Ht 70.0 in | Wt 229.8 lb

## 2023-08-24 DIAGNOSIS — I10 Essential (primary) hypertension: Secondary | ICD-10-CM | POA: Insufficient documentation

## 2023-08-24 DIAGNOSIS — G4730 Sleep apnea, unspecified: Secondary | ICD-10-CM | POA: Insufficient documentation

## 2023-08-24 DIAGNOSIS — D751 Secondary polycythemia: Secondary | ICD-10-CM | POA: Insufficient documentation

## 2023-08-24 DIAGNOSIS — F17218 Nicotine dependence, cigarettes, with other nicotine-induced disorders: Secondary | ICD-10-CM

## 2023-08-24 DIAGNOSIS — Z79899 Other long term (current) drug therapy: Secondary | ICD-10-CM | POA: Diagnosis not present

## 2023-08-24 DIAGNOSIS — Z9229 Personal history of other drug therapy: Secondary | ICD-10-CM | POA: Insufficient documentation

## 2023-08-24 DIAGNOSIS — F17200 Nicotine dependence, unspecified, uncomplicated: Secondary | ICD-10-CM | POA: Insufficient documentation

## 2023-08-24 DIAGNOSIS — F17210 Nicotine dependence, cigarettes, uncomplicated: Secondary | ICD-10-CM | POA: Diagnosis not present

## 2023-08-24 LAB — CBC WITH DIFFERENTIAL (CANCER CENTER ONLY)
Abs Immature Granulocytes: 0.01 10*3/uL (ref 0.00–0.07)
Basophils Absolute: 0.1 10*3/uL (ref 0.0–0.1)
Basophils Relative: 1 %
Eosinophils Absolute: 0.4 10*3/uL (ref 0.0–0.5)
Eosinophils Relative: 7 %
HCT: 51.4 % (ref 39.0–52.0)
Hemoglobin: 17.6 g/dL — ABNORMAL HIGH (ref 13.0–17.0)
Immature Granulocytes: 0 %
Lymphocytes Relative: 29 %
Lymphs Abs: 1.6 10*3/uL (ref 0.7–4.0)
MCH: 29.4 pg (ref 26.0–34.0)
MCHC: 34.2 g/dL (ref 30.0–36.0)
MCV: 85.8 fL (ref 80.0–100.0)
Monocytes Absolute: 0.4 10*3/uL (ref 0.1–1.0)
Monocytes Relative: 7 %
Neutro Abs: 3 10*3/uL (ref 1.7–7.7)
Neutrophils Relative %: 56 %
Platelet Count: 253 10*3/uL (ref 150–400)
RBC: 5.99 MIL/uL — ABNORMAL HIGH (ref 4.22–5.81)
RDW: 12.2 % (ref 11.5–15.5)
WBC Count: 5.5 10*3/uL (ref 4.0–10.5)
nRBC: 0 % (ref 0.0–0.2)

## 2023-08-24 LAB — CMP (CANCER CENTER ONLY)
ALT: 43 U/L (ref 0–44)
AST: 33 U/L (ref 15–41)
Albumin: 4.9 g/dL (ref 3.5–5.0)
Alkaline Phosphatase: 71 U/L (ref 38–126)
Anion gap: 11 (ref 5–15)
BUN: 12 mg/dL (ref 6–20)
CO2: 26 mmol/L (ref 22–32)
Calcium: 10.1 mg/dL (ref 8.9–10.3)
Chloride: 103 mmol/L (ref 98–111)
Creatinine: 0.93 mg/dL (ref 0.61–1.24)
GFR, Estimated: >60 mL/min (ref >60)
Glucose, Bld: 90 mg/dL (ref 70–99)
Potassium: 4.5 mmol/L (ref 3.5–5.1)
Sodium: 140 mmol/L (ref 135–145)
Total Bilirubin: 0.4 mg/dL (ref 0.0–1.2)
Total Protein: 7 g/dL (ref 6.5–8.1)

## 2023-08-24 LAB — IRON AND TIBC
Iron: 110 ug/dL (ref 45–182)
Saturation Ratios: 27 % (ref 17.9–39.5)
TIBC: 412 ug/dL (ref 250–450)
UIBC: 302 ug/dL

## 2023-08-24 LAB — FERRITIN: Ferritin: 82 ng/mL (ref 24–336)

## 2023-08-24 LAB — LACTATE DEHYDROGENASE: LDH: 167 U/L (ref 98–192)

## 2023-08-24 NOTE — Progress Notes (Signed)
 [name]  HEMATOLOGY CLINIC CONSULTATION NOTE    PATIENT NAME: Scott Rasmussen   MR#: [id number] DOB: 01/02/1966  DATE OF SERVICE: 08/24/2023   REFERRING PHYSICIAN  Eliodoro Guerin, DO   Patient Care Team: Eliodoro Guerin, DO as PCP - General (Family Medicine) O'Neal, Cathay Clonts, MD as PCP - Cardiology (Cardiology)    REASON FOR CONSULTATION/ CHIEF COMPLAINT: Polycythemia  ASSESSMENT & PLAN:  Scott Rasmussen is a 58 y.o. gentleman with past medical history of non-critical coronary artery disease (medically managed), hypertension, dyslipidemia, obstructive sleep apnea on CPAP, nicotine dependence, gout, anxiety/depression was referred to our clinic for evaluation of polycythemia.  Polycythemia Secondary polycythemia likely due to cigarette smoking, sleep apnea, and previous testosterone  injections. Hemoglobin was slightly elevated at 17.7, and hematocrit is 53, below the intervention threshold of 55. He exhibits no symptoms of hyperviscosity such as headaches, dizziness, or dyspnea.   Smoking and sleep apnea contribute to increased erythropoiesis due to compensatory mechanisms for oxygen delivery.  Labs today showed hemoglobin of 17.6, hematocrit 51.4.  White count and platelet count are within normal limits.  CMP, LDH unremarkable.  No indication for therapeutic phlebotomy currently.  - Order JAK2 mutation test to rule out primary polycythemia.  - Plan phone call in two weeks to discuss test results  - Schedule follow-up appointment in three months for repeat labs  Nicotine dependence Currently smokes approximately one pack per day. Previously quit from 2012 to 2020, with recent resumption. Expressed desire to quit, especially with the upcoming birth of a grandchild. Previous success with varenicline  and willing to use it again if necessary. Smoking contributes to secondary polycythemia due to carbon monoxide exposure reducing oxygen delivery. -  Encourage smoking cessation - Consider prescribing varenicline  if he decides to use it again  Severe sleep apnea Sleep apnea managed with CPAP therapy, though adherence is inconsistent. Reports improved sleep quality with CPAP use. Sleep apnea may contribute to secondary polycythemia due to intermittent hypoxia.   I reviewed lab results and outside records for this visit and discussed relevant results with the patient. Diagnosis, plan of care and treatment options were also discussed in detail with the patient. Opportunity provided to ask questions and answers provided to his apparent satisfaction. Provided instructions to call our clinic with any problems, questions or concerns prior to return visit. I recommended to continue follow-up with PCP and sub-specialists. He verbalized understanding and agreed with the plan. No barriers to learning was detected.  Arlo Berber, MD  08/24/2023 2:28 PM  [facility] - A DEPT OF [facility] [id number] [place] [address] Dept: [phone] Dept Fax: [phone]   HISTORY OF PRESENTING ILLNESS:   Discussed the use of AI scribe software for clinical note transcription with the patient, who gave verbal consent to proceed.  History of Present Illness Scott Rasmussen "Scott Rasmussen" is a 58 year old male who presents for evaluation of high red blood cell count. He was referred by Dr. Bonnell Butcher for evaluation of high red blood cell count.  On 07/16/2023, labs at his PCPs office showed hemoglobin of 17.7, hematocrit 53.1.  White count was 7300.  Platelet count was 281,000.  Previously labs in January 2024 showed hemoglobin of 17.7, hematocrit 51.5.  He was referred to us  for further evaluation of polycythemia.  He has a history of elevated hemoglobin levels over the years. Approximately a year ago, he began testosterone  therapy  due to fatigue, which significantly increased his  testosterone  levels from 250 to 900. He discontinued testosterone  injections about eight to nine months ago. Prior to stopping, he was administering testosterone  injections weekly.  He has a history of smoking, having quit from 2012 to 2020, but resumed smoking in August 2020. Currently, he smokes about a pack a day. He wants to quit smoking, especially with the upcoming birth of his first grandchild. He previously used Chantix  to quit and is considering it again.  He has a history of coronary artery disease diagnosed around the time he contracted COVID-19. He was hospitalized for two nights due to COVID-19 and was out of work for eight months. He is currently managed on Crestor  and other medications since October 2020. He denies having undergone a cardiac catheterization and has not had stents placed.  He was diagnosed with sleep apnea around 2005 and underwent surgery due to intolerance to CPAP. The condition recurred around 2015, possibly due to weight gain, and he resumed CPAP therapy. He uses the CPAP machine regularly, although occasionally he falls asleep without it. He believes it improves his energy levels.  No headaches, dizziness, lightheadedness, shortness of breath, stroke-like symptoms, or history of blood clots. He mentions waking up around 4:00 to 4:30 AM to urinate, which he discussed with his primary doctor in March, who found his blood work and urine tests to be normal.   Past Medical History:  Diagnosis Date   Abnormal liver function test 11/08/2013   Gout    Hallux rigidus 10/11/2018   right    HTN (hypertension)    Hyperlipidemia    Shortness of breath    Sleep apnea    had sx    Umbilical hernia     SURGICAL HISTORY: Past Surgical History:  Procedure Laterality Date   ROTATOR CUFF REPAIR     TONSILLECTOMY AND ADENOIDECTOMY  2008   UVULOPALATOPHARYNGOPLASTY  2008    SOCIAL HISTORY: Social History   Socioeconomic History   Marital status: Married    Spouse  name: Not on file   Number of children: Not on file   Years of education: Not on file   Highest education level: Some college, no degree  Occupational History   Not on file  Tobacco Use   Smoking status: Every Day    Current packs/day: 1.00    Average packs/day: 1 pack/day for 28.3 years (28.3 ttl pk-yrs)    Types: Cigarettes    Start date: 1997   Smokeless tobacco: Never  Vaping Use   Vaping status: Never Used  Substance and Sexual Activity   Alcohol use: Yes    Comment: 2 drinks per month per pt.   Drug use: Not Currently    Types: Marijuana   Sexual activity: Not on file  Other Topics Concern   Not on file  Social History Narrative   Works at [employer]    Social Drivers of [employer]: Low Risk  (07/14/2023)   Overall Financial Resource Strain (CARDIA)    Difficulty of Paying Living Expenses: Not hard at all  Food Insecurity: No Food Insecurity (07/14/2023)   Hunger Vital Sign    Worried About Running Out of Food in the Last Year: Never true    Ran Out of Food in the Last Year: Never true  Transportation Needs: No Transportation Needs (07/14/2023)   PRAPARE - Administrator, Civil Service (Medical): No    Lack of Transportation (Non-Medical):  No  Physical Activity: Insufficiently Active (07/14/2023)   Exercise Vital Sign    Days of Exercise per Week: 4 days    Minutes of Exercise per Session: 20 min  Stress: No Stress Concern Present (07/14/2023)   [employer] of Occupational Health - Occupational Stress Questionnaire    Feeling of Stress : Only a little  Social Connections: Moderately Integrated (07/14/2023)   Social Connection and Isolation Panel [NHANES]    Frequency of Communication with Friends and Family: More than three times a week    Frequency of Social Gatherings with Friends and Family: Once a week    Attends Religious Services: More than 4 times per year    Active Member of [employer] or Organizations: No     Attends Engineer, structural: Not on file    Marital Status: Married  Catering manager Violence: Not on file    FAMILY HISTORY: Family History  Problem Relation Age of Onset   Healthy Mother    Heart attack Father    Healthy Daughter    Healthy Son    Colon cancer Neg Hx    Esophageal cancer Neg Hx    Rectal cancer Neg Hx    Stomach cancer Neg Hx     CURRENT MEDICATIONS   Current Outpatient Medications  Medication Instructions   buPROPion  ER (WELLBUTRIN  SR) 100 mg, Oral, 2 times daily   desvenlafaxine  (PRISTIQ ) 50 mg, Oral, Daily   diclofenac  (VOLTAREN ) 75 MG EC tablet    famotidine  (PEPCID ) 20 mg, Oral, 2 times daily   febuxostat  (ULORIC ) 40 mg, Oral, Daily   finasteride (PROPECIA) 1 mg, Daily   fluticasone  (FLONASE ) 50 MCG/ACT nasal spray 2 sprays, Each Nare, Daily   icosapent  Ethyl (VASCEPA ) 2 g, Oral, 2 times daily   loratadine  (CLARITIN ) 10 mg, Oral, Daily   LORazepam  (ATIVAN ) 0.5 MG tablet    metoprolol  tartrate (LOPRESSOR ) 25 mg, Oral, 2 times daily   omeprazole  (PRILOSEC) 40 mg, Oral, Daily   ROGAINE EXTRA STRENGTH 5 % SOLN Apply topically.   rosuvastatin  (CRESTOR ) 10 mg, Oral, Daily   sildenafil  (VIAGRA ) 50-100 mg, Oral, Daily PRN   Zepbound  5 mg, Subcutaneous, Weekly, Month #2   Zepbound  2.5 mg, Subcutaneous, Weekly, Month #1   Zepbound  7.5 mg, Subcutaneous, Weekly, Month #3     ALLERGIES  He is allergic to depo-medrol  [methylprednisolone  sodium succ].  REVIEW OF SYSTEMS:    Review of Systems - Oncology  All of the other pertinent systems were reviewed with the patient and were unremarkable except as mentioned above in HPI.  PHYSICAL EXAMINATION:    Onc Performance Status - 08/24/23 0955       ECOG Perf Status   ECOG Perf Status Fully active, able to carry on all pre-disease performance without restriction      KPS SCALE   KPS % SCORE Able to carry on normal activity, minor s/s of disease             Vitals:   08/24/23 0948  08/24/23 0949  BP: (!) 141/94 (!) 134/90  Pulse: 80   Resp: 18   Temp: 98.7 F (37.1 C)   SpO2: 97%    Filed Weights   08/24/23 0948  Weight: 229 lb 12.8 oz (104.2 kg)    Physical Exam Constitutional:      General: He is not in acute distress.    Appearance: Normal appearance.  HENT:     Head: Normocephalic and atraumatic.  Eyes:  General: No scleral icterus.    Conjunctiva/sclera: Conjunctivae normal.  Cardiovascular:     Rate and Rhythm: Normal rate and regular rhythm.     Heart sounds: Normal heart sounds.  Pulmonary:     Effort: Pulmonary effort is normal.     Breath sounds: Normal breath sounds.  Abdominal:     General: There is no distension.  Musculoskeletal:     Right lower leg: No edema.     Left lower leg: No edema.  Neurological:     General: No focal deficit present.     Mental Status: He is alert and oriented to person, place, and time.  Psychiatric:        Mood and Affect: Mood normal.        Behavior: Behavior normal.        Thought Content: Thought content normal.     LABORATORY DATA:   I have reviewed the data as listed.  Results for orders placed or performed in visit on 08/24/23  Ferritin  Result Value Ref Range   Ferritin 82 24 - 336 ng/mL  Lactate dehydrogenase  Result Value Ref Range   LDH 167 98 - 192 U/L  CMP (Cancer Center only)  Result Value Ref Range   Sodium 140 135 - 145 mmol/L   Potassium 4.5 3.5 - 5.1 mmol/L   Chloride 103 98 - 111 mmol/L   CO2 26 22 - 32 mmol/L   Glucose, Bld 90 70 - 99 mg/dL   BUN 12 6 - 20 mg/dL   Creatinine 6.60 6.30 - 1.24 mg/dL   Calcium  10.1 8.9 - 10.3 mg/dL   Total Protein 7.0 6.5 - 8.1 g/dL   Albumin 4.9 3.5 - 5.0 g/dL   AST 33 15 - 41 U/L   ALT 43 0 - 44 U/L   Alkaline Phosphatase 71 38 - 126 U/L   Total Bilirubin 0.4 0.0 - 1.2 mg/dL   GFR, Estimated >16 >01 mL/min   Anion gap 11 5 - 15  CBC with Differential (Cancer Center Only)  Result Value Ref Range   WBC Count 5.5 4.0 - 10.5  K/uL   RBC 5.99 (H) 4.22 - 5.81 MIL/uL   Hemoglobin 17.6 (H) 13.0 - 17.0 g/dL   HCT 09.3 23.5 - 57.3 %   MCV 85.8 80.0 - 100.0 fL   MCH 29.4 26.0 - 34.0 pg   MCHC 34.2 30.0 - 36.0 g/dL   RDW 22.0 25.4 - 27.0 %   Platelet Count 253 150 - 400 K/uL   nRBC 0.0 0.0 - 0.2 %   Neutrophils Relative % 56 %   Neutro Abs 3.0 1.7 - 7.7 K/uL   Lymphocytes Relative 29 %   Lymphs Abs 1.6 0.7 - 4.0 K/uL   Monocytes Relative 7 %   Monocytes Absolute 0.4 0.1 - 1.0 K/uL   Eosinophils Relative 7 %   Eosinophils Absolute 0.4 0.0 - 0.5 K/uL   Basophils Relative 1 %   Basophils Absolute 0.1 0.0 - 0.1 K/uL   Immature Granulocytes 0 %   Abs Immature Granulocytes 0.01 0.00 - 0.07 K/uL    RADIOGRAPHIC STUDIES:  No recent pertinent imaging available to review.  Orders Placed This Encounter  Procedures   CBC with Differential (Cancer Center Only)    Standing Status:   Future    Number of Occurrences:   1    Expiration Date:   08/23/2024   CMP (Cancer Center only)    Standing Status:  Future    Number of Occurrences:   1    Expiration Date:   08/23/2024   Lactate dehydrogenase    Standing Status:   Future    Number of Occurrences:   1    Expiration Date:   08/23/2024   Iron and TIBC    Standing Status:   Future    Number of Occurrences:   1    Expiration Date:   08/23/2024   Ferritin    Standing Status:   Future    Number of Occurrences:   1    Expiration Date:   08/23/2024   Erythropoietin    Standing Status:   Future    Number of Occurrences:   1    Expiration Date:   08/23/2024   JAK2 [id number] rfx CALR/MPL/E12-15    Standing Status:   Future    Number of Occurrences:   1    Expiration Date:   08/23/2024   Testosterone     Standing Status:   Future    Number of Occurrences:   1    Expiration Date:   08/23/2024    Future Appointments  Date Time Provider Department Center  09/02/2023  2:00 PM Vicky Grange M, DO [facility] None     I spent a total of 55 minutes during this encounter with the  patient including review of chart and various tests results, discussions about plan of care and coordination of care plan.   This document was completed utilizing speech recognition software. Grammatical errors, random word insertions, pronoun errors, and incomplete sentences are an occasional consequence of this system due to software limitations, ambient noise, and hardware issues. Any formal questions or concerns about the content, text or information contained within the body of this dictation should be directly addressed to the provider for clarification.

## 2023-08-24 NOTE — Assessment & Plan Note (Signed)
 Sleep apnea managed with CPAP therapy, though adherence is inconsistent. Reports improved sleep quality with CPAP use. Sleep apnea may contribute to secondary polycythemia due to intermittent hypoxia.

## 2023-08-24 NOTE — Telephone Encounter (Signed)
 Patient has been scheduled for follow-up visit per 08/24/23 LOS.  LVM notifying pt of appt details, provided my direct number to pt if appt changes need to be made.

## 2023-08-24 NOTE — Assessment & Plan Note (Signed)
 Currently smokes approximately one pack per day. Previously quit from 2012 to 2020, with recent resumption. Expressed desire to quit, especially with the upcoming birth of a grandchild. Previous success with varenicline  and willing to use it again if necessary. Smoking contributes to secondary polycythemia due to carbon monoxide exposure reducing oxygen delivery. - Encourage smoking cessation - Consider prescribing varenicline  if he decides to use it again

## 2023-08-24 NOTE — Assessment & Plan Note (Signed)
 Secondary polycythemia likely due to cigarette smoking, sleep apnea, and previous testosterone  injections. Hemoglobin was slightly elevated at 17.7, and hematocrit is 53, below the intervention threshold of 55. He exhibits no symptoms of hyperviscosity such as headaches, dizziness, or dyspnea.   Smoking and sleep apnea contribute to increased erythropoiesis due to compensatory mechanisms for oxygen delivery.  Labs today showed hemoglobin of 17.6, hematocrit 51.4.  White count and platelet count are within normal limits.  CMP, LDH unremarkable.  No indication for therapeutic phlebotomy currently.  - Order JAK2 mutation test to rule out primary polycythemia.  - Plan phone call in two weeks to discuss test results  - Schedule follow-up appointment in three months for repeat labs

## 2023-08-25 DIAGNOSIS — M51362 Other intervertebral disc degeneration, lumbar region with discogenic back pain and lower extremity pain: Secondary | ICD-10-CM | POA: Diagnosis not present

## 2023-08-25 DIAGNOSIS — M5033 Other cervical disc degeneration, cervicothoracic region: Secondary | ICD-10-CM | POA: Diagnosis not present

## 2023-08-25 DIAGNOSIS — M9903 Segmental and somatic dysfunction of lumbar region: Secondary | ICD-10-CM | POA: Diagnosis not present

## 2023-08-25 DIAGNOSIS — M9901 Segmental and somatic dysfunction of cervical region: Secondary | ICD-10-CM | POA: Diagnosis not present

## 2023-08-25 LAB — TESTOSTERONE: Testosterone: 362 ng/dL (ref 264–916)

## 2023-08-26 LAB — ERYTHROPOIETIN: Erythropoietin: 3.7 m[IU]/mL (ref 2.6–18.5)

## 2023-08-27 ENCOUNTER — Encounter: Payer: Self-pay | Admitting: Family Medicine

## 2023-08-27 ENCOUNTER — Other Ambulatory Visit: Payer: Self-pay

## 2023-08-27 DIAGNOSIS — M5033 Other cervical disc degeneration, cervicothoracic region: Secondary | ICD-10-CM | POA: Diagnosis not present

## 2023-08-27 DIAGNOSIS — K760 Fatty (change of) liver, not elsewhere classified: Secondary | ICD-10-CM

## 2023-08-27 DIAGNOSIS — I2510 Atherosclerotic heart disease of native coronary artery without angina pectoris: Secondary | ICD-10-CM

## 2023-08-27 DIAGNOSIS — M9903 Segmental and somatic dysfunction of lumbar region: Secondary | ICD-10-CM | POA: Diagnosis not present

## 2023-08-27 DIAGNOSIS — I700 Atherosclerosis of aorta: Secondary | ICD-10-CM

## 2023-08-27 DIAGNOSIS — G4730 Sleep apnea, unspecified: Secondary | ICD-10-CM

## 2023-08-27 DIAGNOSIS — M9901 Segmental and somatic dysfunction of cervical region: Secondary | ICD-10-CM | POA: Diagnosis not present

## 2023-08-27 DIAGNOSIS — E66811 Obesity, class 1: Secondary | ICD-10-CM

## 2023-08-27 DIAGNOSIS — E6609 Other obesity due to excess calories: Secondary | ICD-10-CM

## 2023-08-27 DIAGNOSIS — M51362 Other intervertebral disc degeneration, lumbar region with discogenic back pain and lower extremity pain: Secondary | ICD-10-CM | POA: Diagnosis not present

## 2023-08-28 NOTE — Telephone Encounter (Signed)
 States note taking in chart please advise if this is okay to send in/?

## 2023-08-31 DIAGNOSIS — M9901 Segmental and somatic dysfunction of cervical region: Secondary | ICD-10-CM | POA: Diagnosis not present

## 2023-08-31 DIAGNOSIS — M9903 Segmental and somatic dysfunction of lumbar region: Secondary | ICD-10-CM | POA: Diagnosis not present

## 2023-08-31 DIAGNOSIS — M5033 Other cervical disc degeneration, cervicothoracic region: Secondary | ICD-10-CM | POA: Diagnosis not present

## 2023-09-01 ENCOUNTER — Telehealth: Payer: Self-pay

## 2023-09-01 LAB — JAK2 V617F RFX CALR/MPL/E12-15

## 2023-09-01 LAB — CALR +MPL + E12-E15  (REFLEX)

## 2023-09-01 NOTE — Telephone Encounter (Signed)
 2nd attempt to call pt regarding pcp being out this week , no vm just keeps ringing  If pt returns call please get new apt made

## 2023-09-01 NOTE — Telephone Encounter (Signed)
 Error

## 2023-09-02 ENCOUNTER — Telehealth: Admitting: Family Medicine

## 2023-09-07 DIAGNOSIS — M5033 Other cervical disc degeneration, cervicothoracic region: Secondary | ICD-10-CM | POA: Diagnosis not present

## 2023-09-07 DIAGNOSIS — M9901 Segmental and somatic dysfunction of cervical region: Secondary | ICD-10-CM | POA: Diagnosis not present

## 2023-09-07 DIAGNOSIS — M9903 Segmental and somatic dysfunction of lumbar region: Secondary | ICD-10-CM | POA: Diagnosis not present

## 2023-09-07 MED ORDER — ZEPBOUND 5 MG/0.5ML ~~LOC~~ SOAJ: 5.0000 mg | SUBCUTANEOUS | 0 refills | Status: DC

## 2023-09-07 MED ORDER — ZEPBOUND 2.5 MG/0.5ML ~~LOC~~ SOAJ: 2.5000 mg | SUBCUTANEOUS | 0 refills | Status: DC

## 2023-09-07 MED ORDER — ZEPBOUND 7.5 MG/0.5ML ~~LOC~~ SOAJ: 7.5000 mg | SUBCUTANEOUS | 0 refills | Status: DC

## 2023-09-08 ENCOUNTER — Encounter: Payer: Self-pay | Admitting: Family Medicine

## 2023-09-08 ENCOUNTER — Inpatient Hospital Stay (HOSPITAL_BASED_OUTPATIENT_CLINIC_OR_DEPARTMENT_OTHER): Admitting: Oncology

## 2023-09-08 DIAGNOSIS — D751 Secondary polycythemia: Secondary | ICD-10-CM | POA: Diagnosis not present

## 2023-09-08 NOTE — Progress Notes (Signed)
 [facility]  HEMATOLOGY-ONCOLOGY ELECTRONIC VISIT PROGRESS NOTE  PATIENT NAME: Scott Rasmussen   MR#: [id number] DOB: 07/18/1965  DATE OF SERVICE: 09/08/2023  Patient Care Team: Eliodoro Guerin, DO as PCP - General (Family Medicine) O'Neal, Cathay Clonts, MD as PCP - Cardiology (Cardiology)  I connected with the patient via telephone conference and verified that I am speaking with the correct person using two identifiers. The patient's location is at home and I am providing care from the [facility].  I discussed the limitations, risks, security and privacy concerns of performing an evaluation and management service by e-visits and the availability of in person appointments. I also discussed with the patient that there may be a patient responsible charge related to this service. The patient expressed understanding and agreed to proceed.   ASSESSMENT & PLAN:   Scott Rasmussen is a 58 y.o. gentleman with past medical history of non-critical coronary artery disease (medically managed), hypertension, dyslipidemia, obstructive sleep apnea on CPAP, nicotine dependence, gout, anxiety/depression was referred to our clinic in May 2025 for evaluation of polycythemia.  Workup consistent with secondary polycythemia, likely from cigarette smoking and sleep apnea.  Secondary polycythemia  Secondary polycythemia likely due to cigarette smoking, sleep apnea, and previous testosterone  injections. Hemoglobin was slightly elevated at 17.7, and hematocrit is 53, below the intervention threshold of 55. He exhibits no symptoms of hyperviscosity such as headaches, dizziness, or dyspnea.    Smoking and sleep apnea contribute to increased erythropoiesis due to compensatory mechanisms for oxygen delivery.   On his consultation with us  on 08/24/2023, labs showed hemoglobin of 17.6, hematocrit 51.4.  White count and platelet count were within normal limits.  CMP, LDH unremarkable.  Erythropoietin   level was normal.  JAK2 V617F mutation followed by reflex testing to include CALR, MPL, exon 12-15 mutations were all negative.  No evidence of primary polycythemia.   Given second polycythemia, goal would be to maintain hematocrit below 55 with therapeutic phlebotomies as needed.  No indication for therapeutic phlebotomy currently.  - Schedule follow-up appointment in three months for repeat labs   I discussed the assessment and treatment plan with the patient. The patient was provided an opportunity to ask questions and all were answered. The patient agreed with the plan and demonstrated an understanding of the instructions. The patient was advised to call back or seek an in-person evaluation if the symptoms worsen or if the condition fails to improve as anticipated.    I spent 11 minutes over the phone with the patient reviewing test results, discuss management and coordination/planning of care.  Korynn Kenedy, MD  [facility] - A DEPT OF [facility] [address] [place] [address] Dept: [phone] Dept Fax: [phone]   INTERVAL HISTORY:  Please see above for problem oriented charting.  The purpose of today's discussion is to explain recent lab results and to formulate plan of care.  Discussed the use of AI scribe software for clinical note transcription with the patient, who gave verbal consent to proceed.  History of Present Illness [name] "Scott Rasmussen" is a 58 year old male who presents for follow-up of his hematologic lab results.  He has no new concerns since his last visit a couple of weeks ago. He was contacted to discuss the results of his recent lab work conducted on Aug 24, 2023.  His hemoglobin level was 17.6 and hematocrit was 51.4, both below the  threshold that would necessitate a phlebotomy. His white blood cell count, platelet count, kidney function tests, and liver function tests  were all within normal limits.  Bone marrow tests showed no mutations, and his testosterone  level was 362, which falls within the normal range. His iron studies were also normal.     SUMMARY OF HEMATOLOGY HISTORY:  On 07/16/2023, labs at his PCPs office showed hemoglobin of 17.7, hematocrit 53.1.  White count was 7300.  Platelet count was 281,000.  Previously labs in January 2024 showed hemoglobin of 17.7, hematocrit 51.5.  He was referred to us  for further evaluation of polycythemia.   He has a history of elevated hemoglobin levels over the years. Approximately a year ago, he began testosterone  therapy due to fatigue, which significantly increased his testosterone  levels from 250 to 900. He discontinued testosterone  injections about eight to nine months ago. Prior to stopping, he was administering testosterone  injections weekly.   He has a history of smoking, having quit from 2012 to 2020, but resumed smoking in August 2020. Currently, he smokes about a pack a day. He wants to quit smoking, especially with the upcoming birth of his first grandchild. He previously used Chantix  to quit and is considering it again.   He has a history of coronary artery disease diagnosed around the time he contracted COVID-19. He was hospitalized for two nights due to COVID-19 and was out of work for eight months. He is currently managed on Crestor  and other medications since October 2020. He denies having undergone a cardiac catheterization and has not had stents placed.   He was diagnosed with sleep apnea around 2005 and underwent surgery due to intolerance to CPAP. The condition recurred around 2015, possibly due to weight gain, and he resumed CPAP therapy. He uses the CPAP machine regularly, although occasionally he falls asleep without it. He believes it improves his energy levels.   No headaches, dizziness, lightheadedness, shortness of breath, stroke-like symptoms, or history of blood clots.   Secondary  polycythemia likely due to cigarette smoking, sleep apnea, and previous testosterone  injections. Hemoglobin was slightly elevated at 17.7, and hematocrit is 53, below the intervention threshold of 55. He exhibits no symptoms of hyperviscosity such as headaches, dizziness, or dyspnea.    Smoking and sleep apnea contribute to increased erythropoiesis due to compensatory mechanisms for oxygen delivery.   On his consultation with us  on 08/24/2023, labs showed hemoglobin of 17.6, hematocrit 51.4.  White count and platelet count were within normal limits.  CMP, LDH unremarkable.  Erythropoietin  level was normal.  JAK2 V617F mutation followed by reflex testing to include CALR, MPL, exon 12-15 mutations were all negative.  No evidence of primary polycythemia.   Given second polycythemia, goal would be to maintain hematocrit below 55 with therapeutic phlebotomies as needed.  No indication for therapeutic phlebotomy currently.  REVIEW OF SYSTEMS:    Review of Systems - Oncology  All other pertinent systems were reviewed with the patient and are negative.  I have reviewed the past medical history, past surgical history, social history and family history with the patient and they are unchanged from previous note.  ALLERGIES:  He is allergic to depo-medrol  [methylprednisolone  sodium succ].  MEDICATIONS:  Current Outpatient Medications  Medication Sig Dispense Refill   buPROPion  ER (WELLBUTRIN  SR) 100 MG 12 hr tablet Take 1 tablet (100 mg total) by mouth 2 (two) times daily. 180 tablet 3   desvenlafaxine  (PRISTIQ ) 50 MG 24 hr tablet Take 1 tablet (50 mg total)  by mouth daily. 90 tablet 3   diclofenac  (VOLTAREN ) 75 MG EC tablet      famotidine  (PEPCID ) 20 MG tablet Take 1 tablet (20 mg total) by mouth 2 (two) times daily. 60 tablet 2   febuxostat  (ULORIC ) 40 MG tablet TAKE 1 TABLET BY MOUTH DAILY 90 tablet 0   finasteride (PROPECIA) 1 MG tablet Take 1 mg by mouth daily.     fluticasone  (FLONASE ) 50 MCG/ACT  nasal spray Place 2 sprays into both nostrils daily. 48 g 1   icosapent  Ethyl (VASCEPA ) 1 g capsule Take 2 capsules (2 g total) by mouth 2 (two) times daily. 360 capsule 3   loratadine  (CLARITIN ) 10 MG tablet Take 1 tablet (10 mg total) by mouth daily. 90 tablet 3   LORazepam  (ATIVAN ) 0.5 MG tablet      metoprolol  tartrate (LOPRESSOR ) 25 MG tablet Take 1 tablet (25 mg total) by mouth 2 (two) times daily. 180 tablet 3   omeprazole  (PRILOSEC) 40 MG capsule Take 1 capsule (40 mg total) by mouth daily. 90 capsule 3   ROGAINE EXTRA STRENGTH 5 % SOLN Apply topically.     rosuvastatin  (CRESTOR ) 10 MG tablet Take 1 tablet (10 mg total) by mouth daily. 90 tablet 3   sildenafil  (VIAGRA ) 100 MG tablet Take 0.5-1 tablets (50-100 mg total) by mouth daily as needed for erectile dysfunction. 5 tablet 11   tirzepatide  (ZEPBOUND ) 2.5 MG/0.5ML Pen Inject 2.5 mg into the skin once a week. Month #1 2 mL 0   tirzepatide  (ZEPBOUND ) 5 MG/0.5ML Pen Inject 5 mg into the skin once a week. Month #2 2 mL 0   tirzepatide  (ZEPBOUND ) 7.5 MG/0.5ML Pen Inject 7.5 mg into the skin once a week. Month #3 2 mL 0   varenicline  (CHANTIX  CONTINUING MONTH PAK) 1 MG tablet Take 1 tablet (1 mg total) by mouth 2 (two) times daily. 180 tablet 1   Varenicline  Tartrate, Starter, (CHANTIX  STARTING MONTH PAK) 0.5 MG X 11 & 1 MG X 42 TBPK Take 0.5 mg tablet by mouth once daily x3 days, then 0.5 mg tablet twice daily x4 days, then increase to one 1 mg tablet twice daily. Month #1, then move to continuing pack 53 each 0   No current facility-administered medications for this visit.    PHYSICAL EXAMINATION:    Onc Performance Status - 09/08/23 1700       ECOG Perf Status   ECOG Perf Status Fully active, able to carry on all pre-disease performance without restriction      KPS SCALE   KPS % SCORE Able to carry on normal activity, minor s/s of disease             LABORATORY DATA:   I have reviewed the data as listed.  Recent  Results (from the past 2160 hours)  CBC     Status: Abnormal   Collection Time: 07/16/23  9:22 AM  Result Value Ref Range   WBC 7.3 3.4 - 10.8 x10E3/uL   RBC 5.98 (H) 4.14 - 5.80 x10E6/uL   Hemoglobin 17.7 13.0 - 17.7 g/dL   Hematocrit 13.0 (H) 86.5 - 51.0 %   MCV 89 79 - 97 fL   MCH 29.6 26.6 - 33.0 pg   MCHC 33.3 31.5 - 35.7 g/dL   RDW 78.4 69.6 - 29.5 %   Platelets 281 150 - 450 x10E3/uL  CMP14+EGFR     Status: Abnormal   Collection Time: 07/16/23  9:22 AM  Result Value Ref Range  Glucose 93 70 - 99 mg/dL   BUN 18 6 - 24 mg/dL   Creatinine, Ser 6.57 0.76 - 1.27 mg/dL   eGFR 79 >84 ON/GEX/5.28   BUN/Creatinine Ratio 16 9 - 20   Sodium 139 134 - 144 mmol/L   Potassium 4.7 3.5 - 5.2 mmol/L   Chloride 100 96 - 106 mmol/L   CO2 24 20 - 29 mmol/L   Calcium  10.3 (H) 8.7 - 10.2 mg/dL   Total Protein 7.2 6.0 - 8.5 g/dL   Albumin 4.8 3.8 - 4.9 g/dL   Globulin, Total 2.4 1.5 - 4.5 g/dL   Bilirubin Total 0.5 0.0 - 1.2 mg/dL   Alkaline Phosphatase 77 44 - 121 IU/L   AST 28 0 - 40 IU/L   ALT 44 0 - 44 IU/L  Lipid panel     Status: Abnormal   Collection Time: 07/16/23  9:22 AM  Result Value Ref Range   Cholesterol, Total 147 100 - 199 mg/dL   Triglycerides 413 (H) 0 - 149 mg/dL   HDL 49 >24 mg/dL   VLDL Cholesterol Cal 30 5 - 40 mg/dL   LDL Chol Calc (NIH) 68 0 - 99 mg/dL   Chol/HDL Ratio 3.0 0.0 - 5.0 ratio    Comment:                                   T. Chol/HDL Ratio                                             Men  Women                               1/2 Avg.Risk  3.4    3.3                                   Avg.Risk  5.0    4.4                                2X Avg.Risk  9.6    7.1                                3X Avg.Risk 23.4   11.0   PSA     Status: None   Collection Time: 07/16/23  9:22 AM  Result Value Ref Range   Prostate Specific Ag, Serum 0.1 0.0 - 4.0 ng/mL    Comment: Roche ECLIA methodology. According to the American Urological Association, Serum PSA  should decrease and remain at undetectable levels after radical prostatectomy. The AUA defines biochemical recurrence as an initial PSA value 0.2 ng/mL or greater followed by a subsequent confirmatory PSA value 0.2 ng/mL or greater. Values obtained with different assay methods or kits cannot be used interchangeably. Results cannot be interpreted as absolute evidence of the presence or absence of malignant disease.   TSH     Status: None   Collection Time: 07/16/23  9:22 AM  Result Value Ref Range   TSH 1.790 0.450 - 4.500 uIU/mL  Urine  Culture     Status: None   Collection Time: 07/16/23  9:24 AM   Specimen: Urine   UR  Result Value Ref Range   Urine Culture, Routine Final report    Organism ID, Bacteria No growth   Urinalysis, Routine w reflex microscopic     Status: None   Collection Time: 07/16/23  9:24 AM  Result Value Ref Range   Specific Gravity, UA 1.015 1.005 - 1.030   pH, UA 6.0 5.0 - 7.5   Color, UA Yellow Yellow   Appearance Ur Clear Clear   Leukocytes,UA Negative Negative   Protein,UA Negative Negative/Trace   Glucose, UA Negative Negative   Ketones, UA Negative Negative   RBC, UA Negative Negative   Bilirubin, UA Negative Negative   Urobilinogen, Ur 0.2 0.2 - 1.0 mg/dL   Nitrite, UA Negative Negative  Testosterone      Status: None   Collection Time: 08/24/23 10:35 AM  Result Value Ref Range   Testosterone  362 264 - 916 ng/dL    Comment: (NOTE) Adult male reference interval is based on a population of healthy nonobese males (BMI <30) between 46 and 51 years old. Travison, et.al. JCEM [phone]. PMID: 91478295. Performed At: [facility] [address] [place], [place] [id number] Pearlean Botts MD QI:[id number]   JAK2 V617F rfx CALR/MPL/E12-15     Status: None   Collection Time: 08/24/23 10:35 AM  Result Value Ref Range   Specimen Type Comment:     Comment: NOT PROVIDED   JAK2 V617F Result Comment     Comment: (NOTE) NEGATIVE The JAK2  V617F mutation is not detected in the provided specimen of this individual. Results should be interpreted in conjunction with clinical and other laboratory findings for the most accurate interpretation. This test was developed and its performance characteristics determined by [facility]. It has not been cleared or approved by the Food and Drug Administration.    Reflex Comment     Comment: (NOTE) Reflex to CALR Mutation Analysis, JAK2 Exon 12-15 Mutation Analysis, and MPL Mutation Analysis is indicated.    V617F Rfx CALR/MPL/E12-15 Bkgd Comment     Comment: (NOTE)    Molecular testing of blood or bone marrow is useful in the evaluation of suspected myeloproliferative neoplasms (MPN). Mutations in the JAK2, MPL, and CALR genes are present in virtually all MPNs and their presence help distinguish benign reactive processes from clonal neoplasms. These mutations are generally considered mutually exclusive, although concurrent clones have been reported in rare patients. This test will assess for the JAK2V617F (exon 14) mutation first and will reflex to CALR mutation analysis, MPL mutation analysis, and JAK2 exon 12 to 15 mutation analysis if the JAK2V617F mutation is negative.    The JAK2 (Janus kinase 2) gene encodes for a non-receptor protein tyrosine kinase that activates cytokine and growth factor signaling. The V617F (c.1849 G>T) mutation results in constitutive activation of JAK2 and downstream STAT5 and ERK signaling. The V617F mutation is observed in approximately 95% of polycythemia vera (PV), 60% of essential thromboc ythemia (ET) and primary myelofibrosis (PMF). It is also infrequently present (3-5%) in myelodysplastic syndrome, chronic myelomonocytic leukemia, and other atypical chronic myeloid disorders. A small percentage of JAK2 mutation positive patients (3.3%) contain other non-V617F mutations within exons 12 to 15. In particular, mutations in exon 12 of JAK2 have been  described in approximately 3% of patients with PV. JAK2 allele burden correlates with clinical phenotype, with low levels of mutant allele characterized by thrombocytosis, intermediate levels with erythrocytosis,  and high mutant allele burden correlating with enhanced myelopoiesis of the BM, leukocytosis, increasing spleen size, and circulating CD34-positive cells.    The CALR (Calreticulin) gene encodes for a multifunctional calcium -binding protein involved in many cellular activities such as growth, proliferation, adhesion, and programmed cell death. Among patients with JAK2 negative MPNs, CALR are found in a pproximately 70% of patients with JAK2-negative essential thrombocythemia (ET) and 60-88% of patients with JAK2-negative primary myelofibrosis(PMF). Only a minority of patients (approximately 8%) with myelodysplasia have mutations in the CALR gene. CALR mutations are rarely detected in patients with de novo acute myeloid leukemia, chronic myelogenous leukemia, lymphoid leukemia, or solid tumors. CALR mutations are not detected in polycythemia and generally appear to be mutually exclusive with JAK2 mutations and MPL mutations. The majority of mutational changes involve a variety of insertion deletion mutations in exon 9 of the calreticulin gene: approximately 53% of all CALR mutations are a 52 bp deletion (type-1) while the second most prevalent mutation (approximately 32%) contains a 5 bp insertion (type-2). Other mutations (non-type 1 or type 2) are seen in a small minority of cases. CALR mutations in PMF tend to be with a favorable prognosis compared to JAK2 V617F [employer], whereas primary myelofibrosis negative for CALR, JAK2 V617F and MPL mutations (so-called triple negative) is associated with a poor prognosis and shorter survival.    The MPL (myeloproliferative leukemia virus oncogene) gene encodes the thrombopoietin receptor which regulates hematopoiesis and  megakaryopoiesis. Activating MPL mutations are associated with a subset of myeloproliferative neoplasms and acute megakaryoblastic leukemia. MPL W515 mutations are present in approximately 5-8% of patients with primary myelofibrosis (PMF) and 1-4% of patients with essential thrombocythemia (ET). The S505 mutation is detected in patients with hereditary thrombocythemia.    Limitations    This assay has a sensitivity of approximately 1% VAF for JAK2 V617F, 2.5% VAF for other mutations in JAK2 exons 12 to 15, CALR mutations, and MPL mutations. Deletions in JAK2 up to 6 bp and insertions up to 34 bp have been detected in validation studies. Deletions in CALR up to 70 bp and i nsertions up to 12 bp have been detected in validation studies.    Method based next generation sequencing.     Comment: Comment [employer]    References Comment     Comment: (NOTE) Alghasham N, Alnouri Y, Abalkhail H, Wilfredo Hanly. Detection of mutations in JAK2 exons 12-15 by [employer] sequencing. Int J Lab Hematol. 2016 Feb;38(1):34-41. doi: 10.1111/ijlh.16109. Epub 2015 Sep 11. PMID: 60454098. Rana Burr, Charlie Constant, Hasserjian R, Serafin Dames, Borowitz MJ, Gina Lagos MM, [place] CD, Cazzola M, Vardiman JW. The 2016 revision to the World Health Organization classification of myeloid neoplasms and acute leukemia. Blood. 2016 May 19;127(20):2391-405. doi: 10.1182/blood-2016-03-643544. Epub 2016 Apr 11. PMID: 11914782. Asa Bjork [facility], Zhang ZJ, [place] S, Albitar M. Mutation profile of JAK2 transcripts in patients with chronic myeloproliferative neoplasias. J Mol Diagn. 2009 Jan;11(1):49-53.doi: 10.2353/jmoldx.2009.080114. Epub 2008 Dec 12. PMID: 95621308; PMCID: [id number]. NCCN Clinical Practice Guidelines in Oncology (NCCN Guidelines) Myeloproliferative Neoplasms Version 3.2022 - November 29, 2020. Swerdlow SH, Programmer, multimedia. WHO classif ication of Tumours of Haematopoietic and Lymphoid Tissues. 4th edn. Johanna Mutter,  [place]: Geologist, engineering for [employer] on [employer]; 2017. Tefferi A. Primary myelofibrosis: 2021 update on diagnosis, risk-stratification and management. Am J Hematol. 2021 Jan;96(1):145-162. doi: 10.1002/ajh.26050. Epub 2020 Dec 2. PMID: 52841324. Vainchenker W, Kralovics R. Genetic basis and molecular pathophysiology of classical myeloproliferative neoplasms. Blood. 2017 Feb 9;129(6):667-679. doi: 10.1182/blood-2016-10-695940. Epub  2016 Dec 27. PMID: 16109604.    Director Review Comment     Comment: (NOTE) Technical Component performed at [employer] Component performed by: Lauri Poot, PhD, [facility] Director, Molecular Oncology [facility] [place], [address] [place] [address] [phone] Performed At: [facility] [address] [place], [place] [id number] Adams Adams MDPhD IO:[id number] Performed At: [facility] [address] [place], [place] [id number] Adams Adams MDPhD ZD:[id number]   Erythropoietin      Status: None   Collection Time: 08/24/23 10:35 AM  Result Value Ref Range   Erythropoietin  3.7 2.6 - 18.5 mIU/mL    Comment: (NOTE) [employer] Uni[employer] DxI 800 Immunoassay System Values obtained with different assay methods or kits cannot be used interchangeably. Results cannot be interpreted as absolute evidence of the presence or absence of malignant disease. Performed At: [facility] [address] [place], [place] [id number] Pearlean Botts MD EP:[id number]   Ferritin     Status: None   Collection Time: 08/24/23 10:35 AM  Result Value Ref Range   Ferritin 82 24 - 336 ng/mL    Comment: Performed at [facility], [address], [place], [place] [address]  Iron and TIBC     Status: None   Collection Time: 08/24/23 10:35 AM  Result Value Ref Range   Iron 110 45 - 182 ug/dL   TIBC 160 109 - 323 ug/dL   Saturation Ratios 27 17.9 - 39.5 %   UIBC 302 ug/dL    Comment: Performed at [facility], [address], [place], [place] [address]  Lactate dehydrogenase     Status: None   Collection Time: 08/24/23 10:35 AM  Result Value Ref Range   LDH 167 98 - 192 U/L    Comment: Performed at [facility], [address], [place], [place] [address]  CMP (Cancer Center only)     Status: None   Collection Time: 08/24/23 10:35 AM  Result Value Ref Range   Sodium 140 135 - 145 mmol/L   Potassium 4.5 3.5 - 5.1 mmol/L   Chloride 103 98 - 111 mmol/L   CO2 26 22 - 32 mmol/L   Glucose, Bld 90 70 - 99 mg/dL    Comment: Glucose reference range applies only to samples taken after fasting for at least 8 hours.   BUN 12 6 - 20 mg/dL   Creatinine 2.70 6.23 - 1.24 mg/dL   Calcium  10.1 8.9 - 10.3 mg/dL   Total Protein 7.0 6.5 - 8.1 g/dL   Albumin 4.9 3.5 - 5.0 g/dL   AST 33 15 - 41 U/L   ALT 43 0 - 44 U/L   Alkaline Phosphatase 71 38 - 126 U/L   Total Bilirubin 0.4 0.0 - 1.2 mg/dL   GFR, Estimated >76 >28 mL/min    Comment: (NOTE) Calculated using the CKD-EPI Creatinine Equation (2021)    Anion gap 11 5 - 15    Comment: Performed at [facility], [address], [place], [place] [address]  CBC with Differential (Cancer Center Only)     Status: Abnormal   Collection Time: 08/24/23 10:35 AM  Result Value Ref Range   WBC Count 5.5 4.0 - 10.5 K/uL   RBC 5.99 (H) 4.22 - 5.81 MIL/uL   Hemoglobin 17.6 (H) 13.0 - 17.0 g/dL   HCT 61.6 07.3 - 71.0 %   MCV 85.8 80.0 - 100.0 fL   MCH 29.4 26.0 -  34.0 pg   MCHC 34.2 30.0 - 36.0 g/dL   RDW 16.1 09.6 - 04.5 %   Platelet Count 253 150 - 400 K/uL   nRBC 0.0 0.0 - 0.2 %   Neutrophils Relative % 56 %   Neutro Abs 3.0 1.7 - 7.7 K/uL   Lymphocytes Relative 29 %   Lymphs Abs 1.6 0.7 - 4.0 K/uL   Monocytes Relative 7 %   Monocytes Absolute 0.4 0.1 - 1.0 K/uL   Eosinophils Relative 7 %   Eosinophils Absolute 0.4 0.0 - 0.5 K/uL   Basophils Relative 1 %   Basophils Absolute 0.1 0.0 - 0.1 K/uL   Immature  Granulocytes 0 %   Abs Immature Granulocytes 0.01 0.00 - 0.07 K/uL    Comment: Performed at [facility], [address] [place], [place] [address]  CALR +MPL + E12-E15 (reflexed)     Status: None   Collection Time: 08/24/23 10:35 AM  Result Value Ref Range   CALR Result Comment     Comment: (NOTE) NEGATIVE No insertions or deletions were detected within the analyzed region of the calreticulin (CALR) gene. A negative result does not entirely exclude the possibility of a clonal population carrying CALR gene mutations that are not covered by this assay. Results should be interpreted in conjunction with clinical and laboratory findings for the most accurate interpretation.    MPL Result Comment     Comment: (NOTE) NEGATIVE No MPL mutation was identified in the provided specimen of this individual. Results should be interpreted in conjunction with clinical and other laboratory findings for the most accurate interpretation.    E12-15 Result Comment     Comment: (NOTE) NEGATIVE    JAK2 mutations were not detected in exons 12, 13, 14 and 15. The G to T nucleotide change encoding the V617F mutation was not detected. This result does not rule out the presence of JAK2 mutation at a level below the detection sensitivity of this assay, the presence of other mutations outside the analyzed region of the JAK2 gene, or the presence of a myeloproliferative or other neoplasm. Result must be correlated with other clinical data for the most accurate diagnosis. Performed At: [facility] [address] [place], [place] [id number] Adams Adams MDPhD [id number]      RADIOGRAPHIC STUDIES:  No recent pertinent imaging studies available to review.  No orders of the defined types were placed in this encounter.    Future Appointments  Date Time Provider Department Center  11/24/2023 10:15 AM [facility] PHLEBOTOMIST [facility] None  11/24/2023 10:30 AM Daryana Whirley, Gale Jude,  MD [facility] None    This document was completed utilizing speech recognition software. Grammatical errors, random word insertions, pronoun errors, and incomplete sentences are an occasional consequence of this system due to software limitations, ambient noise, and hardware issues. Any formal questions or concerns about the content, text or information contained within the body of this dictation should be directly addressed to the provider for clarification.

## 2023-09-09 ENCOUNTER — Encounter: Payer: Self-pay | Admitting: Oncology

## 2023-09-09 ENCOUNTER — Other Ambulatory Visit: Payer: Self-pay | Admitting: Family Medicine

## 2023-09-09 DIAGNOSIS — Z720 Tobacco use: Secondary | ICD-10-CM

## 2023-09-09 DIAGNOSIS — I2510 Atherosclerotic heart disease of native coronary artery without angina pectoris: Secondary | ICD-10-CM

## 2023-09-09 DIAGNOSIS — I700 Atherosclerosis of aorta: Secondary | ICD-10-CM

## 2023-09-09 MED ORDER — VARENICLINE TARTRATE (STARTER) 0.5 MG X 11 & 1 MG X 42 PO TBPK: ORAL_TABLET | ORAL | 0 refills | Status: AC

## 2023-09-09 MED ORDER — VARENICLINE TARTRATE 1 MG PO TABS: 1.0000 mg | ORAL_TABLET | Freq: Two times a day (BID) | ORAL | 1 refills | Status: AC

## 2023-09-09 NOTE — Telephone Encounter (Signed)
 done

## 2023-09-09 NOTE — Assessment & Plan Note (Signed)
  Secondary polycythemia likely due to cigarette smoking, sleep apnea, and previous testosterone  injections. Hemoglobin was slightly elevated at 17.7, and hematocrit is 53, below the intervention threshold of 55. He exhibits no symptoms of hyperviscosity such as headaches, dizziness, or dyspnea.    Smoking and sleep apnea contribute to increased erythropoiesis due to compensatory mechanisms for oxygen delivery.   On his consultation with us  on 08/24/2023, labs showed hemoglobin of 17.6, hematocrit 51.4.  White count and platelet count were within normal limits.  CMP, LDH unremarkable.  Erythropoietin  level was normal.  JAK2 V617F mutation followed by reflex testing to include CALR, MPL, exon 12-15 mutations were all negative.  No evidence of primary polycythemia.   Given second polycythemia, goal would be to maintain hematocrit below 55 with therapeutic phlebotomies as needed.  No indication for therapeutic phlebotomy currently.  - Schedule follow-up appointment in three months for repeat labs

## 2023-09-17 ENCOUNTER — Encounter: Payer: Self-pay | Admitting: Family Medicine

## 2023-09-17 DIAGNOSIS — K760 Fatty (change of) liver, not elsewhere classified: Secondary | ICD-10-CM

## 2023-09-17 DIAGNOSIS — M9901 Segmental and somatic dysfunction of cervical region: Secondary | ICD-10-CM | POA: Diagnosis not present

## 2023-09-17 DIAGNOSIS — M5033 Other cervical disc degeneration, cervicothoracic region: Secondary | ICD-10-CM | POA: Diagnosis not present

## 2023-09-17 DIAGNOSIS — M9903 Segmental and somatic dysfunction of lumbar region: Secondary | ICD-10-CM | POA: Diagnosis not present

## 2023-09-17 DIAGNOSIS — I2510 Atherosclerotic heart disease of native coronary artery without angina pectoris: Secondary | ICD-10-CM

## 2023-09-17 DIAGNOSIS — Z6832 Body mass index (BMI) 32.0-32.9, adult: Secondary | ICD-10-CM

## 2023-09-17 DIAGNOSIS — M51362 Other intervertebral disc degeneration, lumbar region with discogenic back pain and lower extremity pain: Secondary | ICD-10-CM | POA: Diagnosis not present

## 2023-09-17 DIAGNOSIS — G4730 Sleep apnea, unspecified: Secondary | ICD-10-CM

## 2023-09-17 DIAGNOSIS — I700 Atherosclerosis of aorta: Secondary | ICD-10-CM

## 2023-09-18 ENCOUNTER — Other Ambulatory Visit: Payer: Self-pay | Admitting: Family Medicine

## 2023-09-18 DIAGNOSIS — E66811 Obesity, class 1: Secondary | ICD-10-CM

## 2023-09-18 DIAGNOSIS — I2510 Atherosclerotic heart disease of native coronary artery without angina pectoris: Secondary | ICD-10-CM

## 2023-09-18 DIAGNOSIS — G4730 Sleep apnea, unspecified: Secondary | ICD-10-CM

## 2023-09-18 DIAGNOSIS — I700 Atherosclerosis of aorta: Secondary | ICD-10-CM

## 2023-09-18 DIAGNOSIS — K760 Fatty (change of) liver, not elsewhere classified: Secondary | ICD-10-CM

## 2023-09-18 MED ORDER — ZEPBOUND 2.5 MG/0.5ML ~~LOC~~ SOAJ: 2.5000 mg | SUBCUTANEOUS | 0 refills | Status: DC

## 2023-09-18 MED ORDER — ZEPBOUND 5 MG/0.5ML ~~LOC~~ SOAJ: 5.0000 mg | SUBCUTANEOUS | 0 refills | Status: DC

## 2023-09-18 MED ORDER — ZEPBOUND 7.5 MG/0.5ML ~~LOC~~ SOAJ: 7.5000 mg | SUBCUTANEOUS | 0 refills | Status: DC

## 2023-09-18 NOTE — Telephone Encounter (Signed)
 Are we receiving any messages back?  I've got nothing on my desk so I have no idea what they want changed

## 2023-09-18 NOTE — Telephone Encounter (Signed)
 Can you help me figure out what I am doing wrong?

## 2023-09-21 ENCOUNTER — Ambulatory Visit (INDEPENDENT_AMBULATORY_CARE_PROVIDER_SITE_OTHER): Admitting: Nurse Practitioner

## 2023-09-21 ENCOUNTER — Encounter: Payer: Self-pay | Admitting: Nurse Practitioner

## 2023-09-21 VITALS — BP 130/88 | HR 91 | Temp 97.8°F | Ht 70.0 in | Wt 231.0 lb

## 2023-09-21 DIAGNOSIS — W57XXXA Bitten or stung by nonvenomous insect and other nonvenomous arthropods, initial encounter: Secondary | ICD-10-CM

## 2023-09-21 DIAGNOSIS — M51362 Other intervertebral disc degeneration, lumbar region with discogenic back pain and lower extremity pain: Secondary | ICD-10-CM | POA: Diagnosis not present

## 2023-09-21 DIAGNOSIS — S30863A Insect bite (nonvenomous) of scrotum and testes, initial encounter: Secondary | ICD-10-CM | POA: Diagnosis not present

## 2023-09-21 DIAGNOSIS — M9903 Segmental and somatic dysfunction of lumbar region: Secondary | ICD-10-CM | POA: Diagnosis not present

## 2023-09-21 DIAGNOSIS — M9901 Segmental and somatic dysfunction of cervical region: Secondary | ICD-10-CM | POA: Diagnosis not present

## 2023-09-21 DIAGNOSIS — M5033 Other cervical disc degeneration, cervicothoracic region: Secondary | ICD-10-CM | POA: Diagnosis not present

## 2023-09-21 MED ORDER — DOXYCYCLINE HYCLATE 100 MG PO TABS: 100.0000 mg | ORAL_TABLET | Freq: Two times a day (BID) | ORAL | 0 refills | Status: AC

## 2023-09-21 NOTE — Patient Instructions (Signed)
Tick Bite Information, Adult  Ticks are insects that draw blood for food. They climb onto people and animals that brush against the leaves and grasses that they live in. They then bite and attach to the skin. Most ticks are harmless, but some ticks may carry germs that can cause disease. These germs are spread to a person through a bite. To lower your risk of getting a disease from a tick bite, make sure you: Take steps to prevent tick bites. Check for ticks after being outdoors where ticks live. Watch for symptoms of disease if a tick attached to you or if you think a tick bit you. How can I prevent tick bites? Take these steps to help prevent tick bites when you go outdoors in an area where ticks live: Before you go outdoors: Wear long sleeves and long pants to protect your skin from ticks. Wear light-colored clothing so you can see ticks easier. Tuck your pant legs into your socks. Apply insect repellent that has DEET (20% or higher), picaridin, or IR3535 in it to the following areas: Any bare skin. Avoid areas around the eyes and mouth. Edges of clothing, like the top of your boots, the bottom of your pant legs, and your sleeve cuffs. Consider applying an insect repellant that contains permethrin. Follow the instructions on the label. Do not apply permethrin directly to the skin. Instead, apply to the following areas: Clothing and shoes. Outdoor gear and tents. When you are outdoors: Avoid walking through areas with long grass. If you are walking on a trail, stay in the middle of the trail so your skin, hair, and clothing do not touch the bushes. Check for ticks on your clothing, hair, and skin often while you are outdoors. Check again before you go inside. When you go indoors: Check your clothing for ticks. Tumble dry clothes in a dryer on high heat for at least 10 minutes. If clothes are damp, additional time may be needed. If clothes require washing, use hot water. Check your gear and  pets. Shower soon after being outdoors. Check your body for ticks. Do a full body check using a mirror. Be sure to check your scalp, neck, armpits, waist, groin, and joint areas. These are the spots where ticks attach themselves most often. What is the best way to remove a tick?  Remove the tick as soon as possible. Removing it can prevent germs from passing to your body. Do not remove the tick with your bare fingers. Do not try to remove a tick with heat, alcohol, petroleum jelly, or fingernail polish. These things can cause the tick to salivate and regurgitate into your bloodstream, increasing your risk of getting a disease. To remove a tick that is crawling on your skin: Go outside and brush the tick off. Use tape or a lint roller. To remove a tick that is attached to your skin: Wash your hands. If you have gloves, put them on. Use a fine-tipped tweezer, curved forceps, or a tick-removal tool to gently grasp the tick as close to your skin and the tick's head as possible. Gently pull with a steady, upward, and even pressure until the tick lets go. While removing the tick: Take care to keep the tick's head attached to its body. Do not twist or jerk the tick. This can make the tick's head or mouth parts break off and stay in your skin. If this happens, try to remove the mouth parts with tweezers. If you cannot remove them, leave   the area alone and let the skin heal. Do not squeeze or crush the tick's body. This could force disease-carrying fluids from the tick into your body. What should I do after removing a tick? Clean the bite area and your hands with soap and water, rubbing alcohol, or an iodine scrub. If an antiseptic cream or ointment is available, put a small amount on the bite area. Wash and disinfect any tools that you used to remove the tick. How should I dispose of a tick? To dispose of a live tick, use one of these methods: Place it in rubbing alcohol. Place it in a sealed bag  or container, and throw it away. Wrap it tightly in tape, and throw it away. Flush it down the toilet. Where to find more information Centers for Disease Control and Prevention: cdc.gov/ticks U.[place]. Environmental Protection Agency: epa.gov/insect-repellents Contact a health care provider if: You have symptoms of a disease after a tick bite. Symptoms of a tick-borne disease can occur from moments after the tick bites to 30 days after a tick is removed. Symptoms include: Fever or chills. A red rash that makes a circle (bull's-eye rash) in the bite area. Redness and swelling in the bite area. Headache or stiff neck. Muscle, joint, or bone pain. Abnormal tiredness. Numbness in your legs or trouble walking or moving your legs. Tender or swollen lymph glands. Abdominal pain, vomiting, diarrhea, or weight loss. Get help right away if: You are not able to remove a tick. You have muscle weakness or paralysis. Your symptoms get worse or you experience new symptoms. You find an engorged tick on your skin and you are in an area where there is a higher risk of disease from ticks. Summary Ticks may carry germs that can spread to a person through a bite. These germs can cause disease. Wear protective clothing and use insect repellent to prevent tick bites. Follow the instructions on the label. If you find a tick on your body, remove it as soon as possible. If the tick is attached, do not try to remove it with heat, alcohol, petroleum jelly, or fingernail polish. If you have symptoms of a disease after being bitten by a tick, contact a health care provider. This information is not intended to replace advice given to you by your health care provider. Make sure you discuss any questions you have with your health care provider. Document Revised: 07/08/2021 Document Reviewed: 07/08/2021 Elsevier Patient Education  2024 Elsevier Inc.  

## 2023-09-21 NOTE — Progress Notes (Signed)
 Subjective:    Patient ID: Scott Rasmussen, male    DOB: [birth year], 58 y.o.   MRN: [id number]   Chief Complaint: Tick Removal (2 weeks ago removed from testicle. Had RMSF 10 years ago and is concerned)   HPI  Patient removed a tick from testicular area a 9 days ago. Area is no longer red or swollen. He has developed diarrhea and no energy at all.  Patient Active Problem List   Diagnosis Date Noted   Secondary polycythemia 08/24/2023   Nicotine dependence 08/24/2023   Elevated hemoglobin (HCC) 07/15/2023   Elevated hematocrit 07/15/2023   Tobacco use 07/15/2023   Coronary artery disease involving native coronary artery of native heart without angina pectoris 07/15/2023   Fatty liver 07/15/2023   Severe sleep apnea 01/15/2021   Vitamin B12 deficiency 05/28/2020   Plantar fasciitis 09/06/2019   Dyspnea on exertion 03/14/2019   Multiple lung nodules 02/28/2019   Liver lesion 02/28/2019   Degeneration of lumbar intervertebral disc 08/24/2018   Acquired hallux rigidus of right foot 06/10/2018   Metabolic syndrome 03/09/2018   Thoracic aortic atherosclerosis (HCC) 12/05/2015   Anxiety with depression 06/01/2015   Obesity, Class II, BMI 35-39.9 01/03/2013   Gout 11/26/2012   Hypertriglyceridemia 11/26/2012   Gastroesophageal reflux disease 11/26/2012       Review of Systems  Constitutional:  Negative for diaphoresis.  Eyes:  Negative for pain.  Respiratory:  Negative for shortness of breath.   Cardiovascular:  Negative for chest pain, palpitations and leg swelling.  Gastrointestinal:  Negative for abdominal pain.  Endocrine: Negative for polydipsia.  Skin:  Negative for rash.  Neurological:  Negative for dizziness, weakness and headaches.  Hematological:  Does not bruise/bleed easily.  All other systems reviewed and are negative.      Objective:   Physical Exam Constitutional:      Appearance: Normal appearance.  Cardiovascular:     Rate and Rhythm: Normal rate  and regular rhythm.     Heart sounds: Normal heart sounds.  Pulmonary:     Effort: Pulmonary effort is normal.     Breath sounds: Normal breath sounds.  Skin:    General: Skin is warm.  Neurological:     General: No focal deficit present.     Mental Status: He is alert and oriented to person, place, and time.  Psychiatric:        Mood and Affect: Mood normal.        Behavior: Behavior normal.    BP 130/88   Pulse 91   Temp 97.8 F (36.6 C) (Temporal)   Ht 5\' 10"  (1.778 m)   Wt 231 lb (104.8 kg)   SpO2 95%   BMI 33.15 kg/m         Assessment & Plan:   Scott Rasmussen in today with chief complaint of Tick Removal (2 weeks ago removed from testicle. Had RMSF 10 years ago and is concerned)   1. Tick bite of scrotum, initial encounter (Primary) Labs pending - Lyme Disease Serology w/Reflex - Spotted Fever Group Antibodies - doxycycline  (VIBRA -TABS) 100 MG tablet; Take 1 tablet (100 mg total) by mouth 2 (two) times daily. 1 po bid  Dispense: 28 tablet; Refill: 0    The above assessment and management plan was discussed with the patient. The patient verbalized understanding of and has agreed to the management plan. Patient is aware to call the clinic if symptoms persist or worsen. Patient is aware when to return to the  clinic for a follow-up visit. Patient educated on when it is appropriate to go to the emergency department.   Mary-Margaret Gaylyn Keas, FNP

## 2023-09-22 ENCOUNTER — Telehealth: Payer: Self-pay | Admitting: Nurse Practitioner

## 2023-09-22 ENCOUNTER — Ambulatory Visit: Payer: Self-pay | Admitting: Nurse Practitioner

## 2023-09-22 MED ORDER — ZEPBOUND 7.5 MG/0.5ML ~~LOC~~ SOLN: 7.5000 mg | SUBCUTANEOUS | 0 refills | Status: AC

## 2023-09-22 MED ORDER — ZEPBOUND 5 MG/0.5ML ~~LOC~~ SOLN: 5.0000 mg | SUBCUTANEOUS | 0 refills | Status: AC

## 2023-09-22 MED ORDER — ZEPBOUND 2.5 MG/0.5ML ~~LOC~~ SOLN: 2.5000 mg | SUBCUTANEOUS | 0 refills | Status: DC

## 2023-09-22 NOTE — Telephone Encounter (Signed)
 Patient has been scheduled for follow-up visit per 09/21/23 LOS.  LVM notifying pt of appt details, provided my direct number to pt if appt changes need to be made.

## 2023-09-23 LAB — SPOTTED FEVER GROUP ANTIBODIES

## 2023-09-23 LAB — LYME DISEASE SEROLOGY W/REFLEX: Lyme Total Antibody EIA: NEGATIVE

## 2023-09-25 ENCOUNTER — Telehealth: Payer: Self-pay | Admitting: Nurse Practitioner

## 2023-09-25 DIAGNOSIS — F4310 Post-traumatic stress disorder, unspecified: Secondary | ICD-10-CM | POA: Diagnosis not present

## 2023-09-25 NOTE — Telephone Encounter (Signed)
 Pt called to have 8/13 lab and provider appt rescheduled

## 2023-09-28 DIAGNOSIS — M5033 Other cervical disc degeneration, cervicothoracic region: Secondary | ICD-10-CM | POA: Diagnosis not present

## 2023-09-28 DIAGNOSIS — M9903 Segmental and somatic dysfunction of lumbar region: Secondary | ICD-10-CM | POA: Diagnosis not present

## 2023-09-28 DIAGNOSIS — M51362 Other intervertebral disc degeneration, lumbar region with discogenic back pain and lower extremity pain: Secondary | ICD-10-CM | POA: Diagnosis not present

## 2023-09-28 DIAGNOSIS — M9901 Segmental and somatic dysfunction of cervical region: Secondary | ICD-10-CM | POA: Diagnosis not present

## 2023-10-07 DIAGNOSIS — M51362 Other intervertebral disc degeneration, lumbar region with discogenic back pain and lower extremity pain: Secondary | ICD-10-CM | POA: Diagnosis not present

## 2023-10-07 DIAGNOSIS — M9903 Segmental and somatic dysfunction of lumbar region: Secondary | ICD-10-CM | POA: Diagnosis not present

## 2023-10-07 DIAGNOSIS — M9901 Segmental and somatic dysfunction of cervical region: Secondary | ICD-10-CM | POA: Diagnosis not present

## 2023-10-07 DIAGNOSIS — M5033 Other cervical disc degeneration, cervicothoracic region: Secondary | ICD-10-CM | POA: Diagnosis not present

## 2023-10-08 ENCOUNTER — Other Ambulatory Visit: Payer: Self-pay | Admitting: Family Medicine

## 2023-10-08 DIAGNOSIS — G4730 Sleep apnea, unspecified: Secondary | ICD-10-CM

## 2023-10-08 DIAGNOSIS — E6609 Other obesity due to excess calories: Secondary | ICD-10-CM

## 2023-10-08 DIAGNOSIS — I700 Atherosclerosis of aorta: Secondary | ICD-10-CM

## 2023-10-08 DIAGNOSIS — K760 Fatty (change of) liver, not elsewhere classified: Secondary | ICD-10-CM

## 2023-10-08 DIAGNOSIS — E66811 Obesity, class 1: Secondary | ICD-10-CM

## 2023-10-08 DIAGNOSIS — I2510 Atherosclerotic heart disease of native coronary artery without angina pectoris: Secondary | ICD-10-CM

## 2023-10-14 ENCOUNTER — Other Ambulatory Visit: Payer: Self-pay | Admitting: Family Medicine

## 2023-10-14 ENCOUNTER — Encounter: Payer: Self-pay | Admitting: Family Medicine

## 2023-10-14 DIAGNOSIS — I700 Atherosclerosis of aorta: Secondary | ICD-10-CM

## 2023-10-14 DIAGNOSIS — K760 Fatty (change of) liver, not elsewhere classified: Secondary | ICD-10-CM

## 2023-10-14 DIAGNOSIS — I2510 Atherosclerotic heart disease of native coronary artery without angina pectoris: Secondary | ICD-10-CM

## 2023-10-14 DIAGNOSIS — G4730 Sleep apnea, unspecified: Secondary | ICD-10-CM

## 2023-10-14 DIAGNOSIS — E6609 Other obesity due to excess calories: Secondary | ICD-10-CM

## 2023-10-14 MED ORDER — ZEPBOUND 2.5 MG/0.5ML ~~LOC~~ SOLN: 2.5000 mg | SUBCUTANEOUS | 0 refills | Status: AC

## 2023-10-29 DIAGNOSIS — M5033 Other cervical disc degeneration, cervicothoracic region: Secondary | ICD-10-CM | POA: Diagnosis not present

## 2023-10-29 DIAGNOSIS — M51362 Other intervertebral disc degeneration, lumbar region with discogenic back pain and lower extremity pain: Secondary | ICD-10-CM | POA: Diagnosis not present

## 2023-10-29 DIAGNOSIS — M9903 Segmental and somatic dysfunction of lumbar region: Secondary | ICD-10-CM | POA: Diagnosis not present

## 2023-10-29 DIAGNOSIS — M9901 Segmental and somatic dysfunction of cervical region: Secondary | ICD-10-CM | POA: Diagnosis not present

## 2023-11-05 ENCOUNTER — Other Ambulatory Visit: Payer: Self-pay | Admitting: Family Medicine

## 2023-11-05 DIAGNOSIS — M51362 Other intervertebral disc degeneration, lumbar region with discogenic back pain and lower extremity pain: Secondary | ICD-10-CM | POA: Diagnosis not present

## 2023-11-05 DIAGNOSIS — G4730 Sleep apnea, unspecified: Secondary | ICD-10-CM

## 2023-11-05 DIAGNOSIS — I2510 Atherosclerotic heart disease of native coronary artery without angina pectoris: Secondary | ICD-10-CM

## 2023-11-05 DIAGNOSIS — M9903 Segmental and somatic dysfunction of lumbar region: Secondary | ICD-10-CM | POA: Diagnosis not present

## 2023-11-05 DIAGNOSIS — M5033 Other cervical disc degeneration, cervicothoracic region: Secondary | ICD-10-CM | POA: Diagnosis not present

## 2023-11-05 DIAGNOSIS — I700 Atherosclerosis of aorta: Secondary | ICD-10-CM

## 2023-11-05 DIAGNOSIS — E6609 Other obesity due to excess calories: Secondary | ICD-10-CM

## 2023-11-05 DIAGNOSIS — M9901 Segmental and somatic dysfunction of cervical region: Secondary | ICD-10-CM | POA: Diagnosis not present

## 2023-11-05 DIAGNOSIS — K760 Fatty (change of) liver, not elsewhere classified: Secondary | ICD-10-CM

## 2023-11-24 ENCOUNTER — Other Ambulatory Visit

## 2023-11-24 ENCOUNTER — Ambulatory Visit: Admitting: Oncology

## 2023-11-24 DIAGNOSIS — M9903 Segmental and somatic dysfunction of lumbar region: Secondary | ICD-10-CM | POA: Diagnosis not present

## 2023-11-24 DIAGNOSIS — M5033 Other cervical disc degeneration, cervicothoracic region: Secondary | ICD-10-CM | POA: Diagnosis not present

## 2023-11-24 DIAGNOSIS — M9901 Segmental and somatic dysfunction of cervical region: Secondary | ICD-10-CM | POA: Diagnosis not present

## 2023-11-26 ENCOUNTER — Telehealth: Payer: Self-pay | Admitting: Nurse Practitioner

## 2023-12-01 DIAGNOSIS — M9901 Segmental and somatic dysfunction of cervical region: Secondary | ICD-10-CM | POA: Diagnosis not present

## 2023-12-01 DIAGNOSIS — M5033 Other cervical disc degeneration, cervicothoracic region: Secondary | ICD-10-CM | POA: Diagnosis not present

## 2023-12-01 DIAGNOSIS — M9903 Segmental and somatic dysfunction of lumbar region: Secondary | ICD-10-CM | POA: Diagnosis not present

## 2023-12-02 ENCOUNTER — Ambulatory Visit: Admitting: Nurse Practitioner

## 2023-12-02 ENCOUNTER — Other Ambulatory Visit

## 2023-12-02 ENCOUNTER — Telehealth: Payer: Self-pay | Admitting: Nurse Practitioner

## 2023-12-02 DIAGNOSIS — M7061 Trochanteric bursitis, right hip: Secondary | ICD-10-CM | POA: Diagnosis not present

## 2023-12-02 DIAGNOSIS — M1611 Unilateral primary osteoarthritis, right hip: Secondary | ICD-10-CM | POA: Diagnosis not present

## 2023-12-02 NOTE — Progress Notes (Unsigned)
 [facility]   Telephone:([phone] Fax:(336) [phone]    Patient Care Team: Jolinda Norene HERO, DO as PCP - General (Family Medicine) O'Neal, Darryle Ned, MD as PCP - Cardiology (Cardiology)   CHIEF COMPLAINT:   Oncology History   No history exists.     CURRENT THERAPY:   INTERVAL HISTORY   ROS   Past Medical History:  Diagnosis Date   Abnormal liver function test 11/08/2013   Gout    Hallux rigidus 10/11/2018   right    HTN (hypertension)    Hyperlipidemia    Shortness of breath    Sleep apnea    had sx    Umbilical hernia      Past Surgical History:  Procedure Laterality Date   ROTATOR CUFF REPAIR     TONSILLECTOMY AND ADENOIDECTOMY  2008   UVULOPALATOPHARYNGOPLASTY  2008     Outpatient Encounter Medications as of 12/03/2023  Medication Sig   buPROPion  ER (WELLBUTRIN  SR) 100 MG 12 hr tablet Take 1 tablet (100 mg total) by mouth 2 (two) times daily.   desvenlafaxine  (PRISTIQ ) 50 MG 24 hr tablet Take 1 tablet (50 mg total) by mouth daily.   diclofenac  (VOLTAREN ) 75 MG EC tablet    doxycycline  (VIBRA -TABS) 100 MG tablet Take 1 tablet (100 mg total) by mouth 2 (two) times daily. 1 po bid   famotidine  (PEPCID ) 20 MG tablet Take 1 tablet (20 mg total) by mouth 2 (two) times daily.   febuxostat  (ULORIC ) 40 MG tablet TAKE 1 TABLET BY MOUTH DAILY   finasteride (PROPECIA) 1 MG tablet Take 1 mg by mouth daily.   fluticasone  (FLONASE ) 50 MCG/ACT nasal spray Place 2 sprays into both nostrils daily.   icosapent  Ethyl (VASCEPA ) 1 g capsule Take 2 capsules (2 g total) by mouth 2 (two) times daily.   loratadine  (CLARITIN ) 10 MG tablet Take 1 tablet (10 mg total) by mouth daily.   LORazepam  (ATIVAN ) 0.5 MG tablet    metoprolol  tartrate (LOPRESSOR ) 25 MG tablet Take 1 tablet (25 mg total) by mouth 2 (two) times daily.   omeprazole  (PRILOSEC) 40 MG capsule Take 1 capsule (40 mg total) by mouth daily.   ROGAINE EXTRA STRENGTH 5 % SOLN Apply topically.    rosuvastatin  (CRESTOR ) 10 MG tablet Take 1 tablet (10 mg total) by mouth daily.   sildenafil  (VIAGRA ) 100 MG tablet Take 0.5-1 tablets (50-100 mg total) by mouth daily as needed for erectile dysfunction.   tirzepatide  (ZEPBOUND ) 2.5 MG/0.5ML injection vial Inject 2.5 mg into the skin every 7 (seven) days.   tirzepatide  (ZEPBOUND ) 5 MG/0.5ML injection vial Inject 5 mg into the skin every 7 (seven) days.   tirzepatide  (ZEPBOUND ) 7.5 MG/0.5ML injection vial Inject 7.5 mg into the skin every 7 (seven) days.   varenicline  (CHANTIX  CONTINUING MONTH PAK) 1 MG tablet Take 1 tablet (1 mg total) by mouth 2 (two) times daily.   Varenicline  Tartrate, Starter, (CHANTIX  STARTING MONTH PAK) 0.5 MG X 11 & 1 MG X 42 TBPK Take 0.5 mg tablet by mouth once daily x3 days, then 0.5 mg tablet twice daily x4 days, then increase to one 1 mg tablet twice daily. Month #1, then move to continuing pack   No facility-administered encounter medications on file as of 12/03/2023.     There were no vitals filed for this visit. There is no height or weight on file to calculate BMI.   ECOG PERFORMANCE STATUS: {CHL ONC ECOG ED:[id number]}  PHYSICAL  EXAM GENERAL:alert, no distress and comfortable SKIN: no rash  EYES: sclera clear NECK: without mass LYMPH:  no palpable cervical or supraclavicular lymphadenopathy  LUNGS: clear with normal breathing effort HEART: regular rate & rhythm, no lower extremity edema ABDOMEN: abdomen soft, non-tender and normal bowel sounds NEURO: alert & oriented x 3 with fluent speech, no focal motor/sensory deficits Breast exam:  PAC without erythema    CBC    Latest Ref Rng & Units 08/24/2023   10:35 AM 07/16/2023    9:22 AM 05/19/2022    6:37 PM  CBC  WBC 4.0 - 10.5 K/uL 5.5  7.3  7.4   Hemoglobin 13.0 - 17.0 g/dL 82.3  82.2  82.2   Hematocrit 39.0 - 52.0 % 51.4  53.1  51.5   Platelets 150 - 400 K/uL 253  281  282       CMP     Latest Ref Rng & Units 08/24/2023   10:35 AM 07/16/2023     9:22 AM 05/19/2022    6:37 PM  CMP  Glucose 70 - 99 mg/dL 90  93  82   BUN 6 - 20 mg/dL 12  18  13    Creatinine 0.61 - 1.24 mg/dL 9.06  8.89  9.03   Sodium 135 - 145 mmol/L 140  139  140   Potassium 3.5 - 5.1 mmol/L 4.5  4.7  4.1   Chloride 98 - 111 mmol/L 103  100  103   CO2 22 - 32 mmol/L 26  24  27    Calcium  8.9 - 10.3 mg/dL 89.8  89.6  89.5   Total Protein 6.5 - 8.1 g/dL 7.0  7.2  7.4   Total Bilirubin 0.0 - 1.2 mg/dL 0.4  0.5  0.6   Alkaline Phos 38 - 126 U/L 71  77  51   AST 15 - 41 U/L 33  28  22   ALT 0 - 44 U/L 43  44  35       ASSESSMENT & PLAN:  PLAN:  No orders of the defined types were placed in this encounter.     All questions were answered. The patient knows to call the clinic with any problems, questions or concerns. No barriers to learning were detected. I spent *** counseling the patient face to face. The total time spent in the appointment was *** and more than 50% was on counseling, review of test results, and coordination of care.   Andras Grunewald K Nichol Ator, NP 12/02/2023 12:27 PM

## 2023-12-02 NOTE — Progress Notes (Deleted)
 [facility]   Telephone:(336) [phone] Fax:(336) [phone]    Patient Care Team: Jolinda Norene HERO, DO as PCP - General (Family Medicine) O'Neal, Darryle Ned, MD as PCP - Cardiology (Cardiology)   CHIEF COMPLAINT:   Oncology History   No history exists.     CURRENT THERAPY:   INTERVAL HISTORY   ROS   Past Medical History:  Diagnosis Date   Abnormal liver function test 11/08/2013   Gout    Hallux rigidus 10/11/2018   right    HTN (hypertension)    Hyperlipidemia    Shortness of breath    Sleep apnea    had sx    Umbilical hernia      Past Surgical History:  Procedure Laterality Date   ROTATOR CUFF REPAIR     TONSILLECTOMY AND ADENOIDECTOMY  2008   UVULOPALATOPHARYNGOPLASTY  2008     Outpatient Encounter Medications as of 12/03/2023  Medication Sig   buPROPion  ER (WELLBUTRIN  SR) 100 MG 12 hr tablet Take 1 tablet (100 mg total) by mouth 2 (two) times daily.   desvenlafaxine  (PRISTIQ ) 50 MG 24 hr tablet Take 1 tablet (50 mg total) by mouth daily.   diclofenac  (VOLTAREN ) 75 MG EC tablet    doxycycline  (VIBRA -TABS) 100 MG tablet Take 1 tablet (100 mg total) by mouth 2 (two) times daily. 1 po bid   famotidine  (PEPCID ) 20 MG tablet Take 1 tablet (20 mg total) by mouth 2 (two) times daily.   febuxostat  (ULORIC ) 40 MG tablet TAKE 1 TABLET BY MOUTH DAILY   finasteride (PROPECIA) 1 MG tablet Take 1 mg by mouth daily.   fluticasone  (FLONASE ) 50 MCG/ACT nasal spray Place 2 sprays into both nostrils daily.   icosapent  Ethyl (VASCEPA ) 1 g capsule Take 2 capsules (2 g total) by mouth 2 (two) times daily.   loratadine  (CLARITIN ) 10 MG tablet Take 1 tablet (10 mg total) by mouth daily.   LORazepam  (ATIVAN ) 0.5 MG tablet    metoprolol  tartrate (LOPRESSOR ) 25 MG tablet Take 1 tablet (25 mg total) by mouth 2 (two) times daily.   omeprazole  (PRILOSEC) 40 MG capsule Take 1 capsule (40 mg total) by mouth daily.   ROGAINE EXTRA STRENGTH 5 % SOLN Apply topically.    rosuvastatin  (CRESTOR ) 10 MG tablet Take 1 tablet (10 mg total) by mouth daily.   sildenafil  (VIAGRA ) 100 MG tablet Take 0.5-1 tablets (50-100 mg total) by mouth daily as needed for erectile dysfunction.   tirzepatide  (ZEPBOUND ) 2.5 MG/0.5ML injection vial Inject 2.5 mg into the skin every 7 (seven) days.   tirzepatide  (ZEPBOUND ) 5 MG/0.5ML injection vial Inject 5 mg into the skin every 7 (seven) days.   tirzepatide  (ZEPBOUND ) 7.5 MG/0.5ML injection vial Inject 7.5 mg into the skin every 7 (seven) days.   varenicline  (CHANTIX  CONTINUING MONTH PAK) 1 MG tablet Take 1 tablet (1 mg total) by mouth 2 (two) times daily.   Varenicline  Tartrate, Starter, (CHANTIX  STARTING MONTH PAK) 0.5 MG X 11 & 1 MG X 42 TBPK Take 0.5 mg tablet by mouth once daily x3 days, then 0.5 mg tablet twice daily x4 days, then increase to one 1 mg tablet twice daily. Month #1, then move to continuing pack   No facility-administered encounter medications on file as of 12/03/2023.     There were no vitals filed for this visit. There is no height or weight on file to calculate BMI.   ECOG PERFORMANCE STATUS: {CHL ONC ECOG ED:[id number]}  PHYSICAL  EXAM GENERAL:alert, no distress and comfortable SKIN: no rash  EYES: sclera clear NECK: without mass LYMPH:  no palpable cervical or supraclavicular lymphadenopathy  LUNGS: clear with normal breathing effort HEART: regular rate & rhythm, no lower extremity edema ABDOMEN: abdomen soft, non-tender and normal bowel sounds NEURO: alert & oriented x 3 with fluent speech, no focal motor/sensory deficits Breast exam:  PAC without erythema    CBC    Latest Ref Rng & Units 08/24/2023   10:35 AM 07/16/2023    9:22 AM 05/19/2022    6:37 PM  CBC  WBC 4.0 - 10.5 K/uL 5.5  7.3  7.4   Hemoglobin 13.0 - 17.0 g/dL 82.3  82.2  82.2   Hematocrit 39.0 - 52.0 % 51.4  53.1  51.5   Platelets 150 - 400 K/uL 253  281  282       CMP     Latest Ref Rng & Units 08/24/2023   10:35 AM 07/16/2023     9:22 AM 05/19/2022    6:37 PM  CMP  Glucose 70 - 99 mg/dL 90  93  82   BUN 6 - 20 mg/dL 12  18  13    Creatinine 0.61 - 1.24 mg/dL 9.06  8.89  9.03   Sodium 135 - 145 mmol/L 140  139  140   Potassium 3.5 - 5.1 mmol/L 4.5  4.7  4.1   Chloride 98 - 111 mmol/L 103  100  103   CO2 22 - 32 mmol/L 26  24  27    Calcium  8.9 - 10.3 mg/dL 89.8  89.6  89.5   Total Protein 6.5 - 8.1 g/dL 7.0  7.2  7.4   Total Bilirubin 0.0 - 1.2 mg/dL 0.4  0.5  0.6   Alkaline Phos 38 - 126 U/L 71  77  51   AST 15 - 41 U/L 33  28  22   ALT 0 - 44 U/L 43  44  35       ASSESSMENT & PLAN:  PLAN:  No orders of the defined types were placed in this encounter.     All questions were answered. The patient knows to call the clinic with any problems, questions or concerns. No barriers to learning were detected. I spent *** counseling the patient face to face. The total time spent in the appointment was *** and more than 50% was on counseling, review of test results, and coordination of care.   Andras Grunewald K Nichol Ator, NP 12/02/2023 12:27 PM

## 2023-12-03 ENCOUNTER — Inpatient Hospital Stay: Admitting: Nurse Practitioner

## 2023-12-03 ENCOUNTER — Inpatient Hospital Stay: Attending: Oncology

## 2023-12-03 NOTE — Telephone Encounter (Signed)
 Contacted pt to schedule an appt. Unable to reach via phone, voicemail was left.

## 2023-12-07 DIAGNOSIS — M9901 Segmental and somatic dysfunction of cervical region: Secondary | ICD-10-CM | POA: Diagnosis not present

## 2023-12-07 DIAGNOSIS — M5033 Other cervical disc degeneration, cervicothoracic region: Secondary | ICD-10-CM | POA: Diagnosis not present

## 2023-12-07 DIAGNOSIS — M9903 Segmental and somatic dysfunction of lumbar region: Secondary | ICD-10-CM | POA: Diagnosis not present

## 2023-12-15 DIAGNOSIS — F4310 Post-traumatic stress disorder, unspecified: Secondary | ICD-10-CM | POA: Diagnosis not present

## 2023-12-23 DIAGNOSIS — L821 Other seborrheic keratosis: Secondary | ICD-10-CM | POA: Diagnosis not present

## 2023-12-23 DIAGNOSIS — D1801 Hemangioma of skin and subcutaneous tissue: Secondary | ICD-10-CM | POA: Diagnosis not present

## 2023-12-23 DIAGNOSIS — L918 Other hypertrophic disorders of the skin: Secondary | ICD-10-CM | POA: Diagnosis not present

## 2023-12-23 DIAGNOSIS — L720 Epidermal cyst: Secondary | ICD-10-CM | POA: Diagnosis not present

## 2023-12-23 DIAGNOSIS — L814 Other melanin hyperpigmentation: Secondary | ICD-10-CM | POA: Diagnosis not present

## 2023-12-28 DIAGNOSIS — M9901 Segmental and somatic dysfunction of cervical region: Secondary | ICD-10-CM | POA: Diagnosis not present

## 2023-12-28 DIAGNOSIS — M5033 Other cervical disc degeneration, cervicothoracic region: Secondary | ICD-10-CM | POA: Diagnosis not present

## 2023-12-28 DIAGNOSIS — M9903 Segmental and somatic dysfunction of lumbar region: Secondary | ICD-10-CM | POA: Diagnosis not present

## 2023-12-28 DIAGNOSIS — M51362 Other intervertebral disc degeneration, lumbar region with discogenic back pain and lower extremity pain: Secondary | ICD-10-CM | POA: Diagnosis not present

## 2024-01-05 DIAGNOSIS — M9901 Segmental and somatic dysfunction of cervical region: Secondary | ICD-10-CM | POA: Diagnosis not present

## 2024-01-05 DIAGNOSIS — M5033 Other cervical disc degeneration, cervicothoracic region: Secondary | ICD-10-CM | POA: Diagnosis not present

## 2024-01-05 DIAGNOSIS — M9903 Segmental and somatic dysfunction of lumbar region: Secondary | ICD-10-CM | POA: Diagnosis not present

## 2024-01-14 DIAGNOSIS — M5033 Other cervical disc degeneration, cervicothoracic region: Secondary | ICD-10-CM | POA: Diagnosis not present

## 2024-01-14 DIAGNOSIS — M9901 Segmental and somatic dysfunction of cervical region: Secondary | ICD-10-CM | POA: Diagnosis not present

## 2024-01-14 DIAGNOSIS — M9903 Segmental and somatic dysfunction of lumbar region: Secondary | ICD-10-CM | POA: Diagnosis not present

## 2024-01-21 DIAGNOSIS — M9903 Segmental and somatic dysfunction of lumbar region: Secondary | ICD-10-CM | POA: Diagnosis not present

## 2024-01-21 DIAGNOSIS — M5033 Other cervical disc degeneration, cervicothoracic region: Secondary | ICD-10-CM | POA: Diagnosis not present

## 2024-01-21 DIAGNOSIS — M51362 Other intervertebral disc degeneration, lumbar region with discogenic back pain and lower extremity pain: Secondary | ICD-10-CM | POA: Diagnosis not present

## 2024-01-21 DIAGNOSIS — M9901 Segmental and somatic dysfunction of cervical region: Secondary | ICD-10-CM | POA: Diagnosis not present

## 2024-01-22 DIAGNOSIS — M25651 Stiffness of right hip, not elsewhere classified: Secondary | ICD-10-CM | POA: Diagnosis not present

## 2024-01-22 DIAGNOSIS — M25551 Pain in right hip: Secondary | ICD-10-CM | POA: Diagnosis not present

## 2024-01-22 DIAGNOSIS — M6281 Muscle weakness (generalized): Secondary | ICD-10-CM | POA: Diagnosis not present

## 2024-01-26 DIAGNOSIS — F4310 Post-traumatic stress disorder, unspecified: Secondary | ICD-10-CM | POA: Diagnosis not present

## 2024-01-27 DIAGNOSIS — M25551 Pain in right hip: Secondary | ICD-10-CM | POA: Diagnosis not present

## 2024-01-28 DIAGNOSIS — M51362 Other intervertebral disc degeneration, lumbar region with discogenic back pain and lower extremity pain: Secondary | ICD-10-CM | POA: Diagnosis not present

## 2024-01-28 DIAGNOSIS — M5033 Other cervical disc degeneration, cervicothoracic region: Secondary | ICD-10-CM | POA: Diagnosis not present

## 2024-01-28 DIAGNOSIS — M9903 Segmental and somatic dysfunction of lumbar region: Secondary | ICD-10-CM | POA: Diagnosis not present

## 2024-01-28 DIAGNOSIS — M9901 Segmental and somatic dysfunction of cervical region: Secondary | ICD-10-CM | POA: Diagnosis not present

## 2024-02-03 DIAGNOSIS — M51362 Other intervertebral disc degeneration, lumbar region with discogenic back pain and lower extremity pain: Secondary | ICD-10-CM | POA: Diagnosis not present

## 2024-02-03 DIAGNOSIS — M9901 Segmental and somatic dysfunction of cervical region: Secondary | ICD-10-CM | POA: Diagnosis not present

## 2024-02-03 DIAGNOSIS — M5033 Other cervical disc degeneration, cervicothoracic region: Secondary | ICD-10-CM | POA: Diagnosis not present

## 2024-02-03 DIAGNOSIS — M9903 Segmental and somatic dysfunction of lumbar region: Secondary | ICD-10-CM | POA: Diagnosis not present

## 2024-02-04 DIAGNOSIS — D2339 Other benign neoplasm of skin of other parts of face: Secondary | ICD-10-CM | POA: Diagnosis not present

## 2024-02-04 DIAGNOSIS — D485 Neoplasm of uncertain behavior of skin: Secondary | ICD-10-CM | POA: Diagnosis not present

## 2024-02-11 DIAGNOSIS — M25651 Stiffness of right hip, not elsewhere classified: Secondary | ICD-10-CM | POA: Diagnosis not present

## 2024-02-11 DIAGNOSIS — M9903 Segmental and somatic dysfunction of lumbar region: Secondary | ICD-10-CM | POA: Diagnosis not present

## 2024-02-11 DIAGNOSIS — M9901 Segmental and somatic dysfunction of cervical region: Secondary | ICD-10-CM | POA: Diagnosis not present

## 2024-02-11 DIAGNOSIS — M5033 Other cervical disc degeneration, cervicothoracic region: Secondary | ICD-10-CM | POA: Diagnosis not present

## 2024-02-11 DIAGNOSIS — M25551 Pain in right hip: Secondary | ICD-10-CM | POA: Diagnosis not present

## 2024-02-11 DIAGNOSIS — M51362 Other intervertebral disc degeneration, lumbar region with discogenic back pain and lower extremity pain: Secondary | ICD-10-CM | POA: Diagnosis not present

## 2024-02-11 DIAGNOSIS — M6281 Muscle weakness (generalized): Secondary | ICD-10-CM | POA: Diagnosis not present

## 2024-02-18 DIAGNOSIS — M9903 Segmental and somatic dysfunction of lumbar region: Secondary | ICD-10-CM | POA: Diagnosis not present

## 2024-02-18 DIAGNOSIS — M5033 Other cervical disc degeneration, cervicothoracic region: Secondary | ICD-10-CM | POA: Diagnosis not present

## 2024-02-18 DIAGNOSIS — M25551 Pain in right hip: Secondary | ICD-10-CM | POA: Diagnosis not present

## 2024-02-18 DIAGNOSIS — M6281 Muscle weakness (generalized): Secondary | ICD-10-CM | POA: Diagnosis not present

## 2024-02-18 DIAGNOSIS — M9901 Segmental and somatic dysfunction of cervical region: Secondary | ICD-10-CM | POA: Diagnosis not present

## 2024-02-18 DIAGNOSIS — M25651 Stiffness of right hip, not elsewhere classified: Secondary | ICD-10-CM | POA: Diagnosis not present

## 2024-02-22 DIAGNOSIS — M9901 Segmental and somatic dysfunction of cervical region: Secondary | ICD-10-CM | POA: Diagnosis not present

## 2024-02-22 DIAGNOSIS — M5033 Other cervical disc degeneration, cervicothoracic region: Secondary | ICD-10-CM | POA: Diagnosis not present

## 2024-02-22 DIAGNOSIS — M9903 Segmental and somatic dysfunction of lumbar region: Secondary | ICD-10-CM | POA: Diagnosis not present

## 2024-03-01 DIAGNOSIS — M25651 Stiffness of right hip, not elsewhere classified: Secondary | ICD-10-CM | POA: Diagnosis not present

## 2024-03-01 DIAGNOSIS — M25551 Pain in right hip: Secondary | ICD-10-CM | POA: Diagnosis not present

## 2024-03-01 DIAGNOSIS — M6281 Muscle weakness (generalized): Secondary | ICD-10-CM | POA: Diagnosis not present

## 2024-03-09 DIAGNOSIS — B079 Viral wart, unspecified: Secondary | ICD-10-CM | POA: Diagnosis not present

## 2024-03-11 DIAGNOSIS — M1611 Unilateral primary osteoarthritis, right hip: Secondary | ICD-10-CM | POA: Diagnosis not present

## 2024-03-11 DIAGNOSIS — M7061 Trochanteric bursitis, right hip: Secondary | ICD-10-CM | POA: Diagnosis not present

## 2024-11-30 ENCOUNTER — Encounter: Admitting: Family Medicine
